# Patient Record
Sex: Female | Born: 1945 | Race: White | Hispanic: No | Marital: Single | Smoking: Current some day smoker
Health system: Southern US, Community
[De-identification: ages and names within clinical notes are randomized; demographics above are authoritative.]

## PROBLEM LIST (undated history)

## (undated) DIAGNOSIS — I2510 Atherosclerotic heart disease of native coronary artery without angina pectoris: Secondary | ICD-10-CM

## (undated) DIAGNOSIS — E119 Type 2 diabetes mellitus without complications: Secondary | ICD-10-CM

## (undated) DIAGNOSIS — I5032 Chronic diastolic (congestive) heart failure: Secondary | ICD-10-CM

## (undated) DIAGNOSIS — I4891 Unspecified atrial fibrillation: Secondary | ICD-10-CM

## (undated) DIAGNOSIS — N183 Chronic kidney disease, stage 3 unspecified: Secondary | ICD-10-CM

## (undated) DIAGNOSIS — F329 Major depressive disorder, single episode, unspecified: Secondary | ICD-10-CM

## (undated) DIAGNOSIS — C50919 Malignant neoplasm of unspecified site of unspecified female breast: Secondary | ICD-10-CM

## (undated) DIAGNOSIS — J449 Chronic obstructive pulmonary disease, unspecified: Secondary | ICD-10-CM

## (undated) DIAGNOSIS — I10 Essential (primary) hypertension: Secondary | ICD-10-CM

## (undated) DIAGNOSIS — T7840XA Allergy, unspecified, initial encounter: Secondary | ICD-10-CM

## (undated) DIAGNOSIS — I272 Pulmonary hypertension, unspecified: Secondary | ICD-10-CM

## (undated) DIAGNOSIS — F32A Depression, unspecified: Secondary | ICD-10-CM

## (undated) HISTORY — PX: APPENDECTOMY: SHX54

## (undated) HISTORY — PX: CORONARY ARTERY BYPASS GRAFT: SHX141

## (undated) HISTORY — PX: PARATHYROIDECTOMY: SHX19

## (undated) HISTORY — PX: MASTECTOMY: SHX3

## (undated) HISTORY — PX: BREAST LUMPECTOMY WITH AXILLARY LYMPH NODE DISSECTION: SHX5756

---

## 2010-06-24 ENCOUNTER — Inpatient Hospital Stay: Payer: Self-pay | Admitting: Surgery

## 2010-07-03 ENCOUNTER — Ambulatory Visit: Payer: Self-pay | Admitting: Surgery

## 2011-04-14 ENCOUNTER — Emergency Department: Payer: Self-pay | Admitting: Emergency Medicine

## 2011-04-15 ENCOUNTER — Inpatient Hospital Stay (HOSPITAL_COMMUNITY)
Admission: EM | Admit: 2011-04-15 | Discharge: 2011-04-21 | DRG: 286 | Disposition: A | Discharge status: Home / Self Care | Payer: Medicare Other | Source: Other Acute Inpatient Hospital | Attending: Internal Medicine | Admitting: Internal Medicine

## 2011-04-15 DIAGNOSIS — I509 Heart failure, unspecified: Secondary | ICD-10-CM | POA: Diagnosis present

## 2011-04-15 DIAGNOSIS — D509 Iron deficiency anemia, unspecified: Secondary | ICD-10-CM | POA: Diagnosis present

## 2011-04-15 DIAGNOSIS — Z7982 Long term (current) use of aspirin: Secondary | ICD-10-CM

## 2011-04-15 DIAGNOSIS — I2789 Other specified pulmonary heart diseases: Secondary | ICD-10-CM | POA: Diagnosis present

## 2011-04-15 DIAGNOSIS — I059 Rheumatic mitral valve disease, unspecified: Secondary | ICD-10-CM

## 2011-04-15 DIAGNOSIS — E119 Type 2 diabetes mellitus without complications: Secondary | ICD-10-CM | POA: Diagnosis present

## 2011-04-15 DIAGNOSIS — E876 Hypokalemia: Secondary | ICD-10-CM | POA: Diagnosis present

## 2011-04-15 DIAGNOSIS — K2971 Gastritis, unspecified, with bleeding: Secondary | ICD-10-CM | POA: Diagnosis present

## 2011-04-15 DIAGNOSIS — E873 Alkalosis: Secondary | ICD-10-CM | POA: Diagnosis not present

## 2011-04-15 DIAGNOSIS — R188 Other ascites: Secondary | ICD-10-CM | POA: Diagnosis present

## 2011-04-15 DIAGNOSIS — R680 Hypothermia, not associated with low environmental temperature: Secondary | ICD-10-CM | POA: Diagnosis present

## 2011-04-15 DIAGNOSIS — N182 Chronic kidney disease, stage 2 (mild): Secondary | ICD-10-CM | POA: Diagnosis present

## 2011-04-15 DIAGNOSIS — J449 Chronic obstructive pulmonary disease, unspecified: Secondary | ICD-10-CM | POA: Diagnosis present

## 2011-04-15 DIAGNOSIS — N179 Acute kidney failure, unspecified: Secondary | ICD-10-CM | POA: Diagnosis present

## 2011-04-15 DIAGNOSIS — Z951 Presence of aortocoronary bypass graft: Secondary | ICD-10-CM

## 2011-04-15 DIAGNOSIS — I5043 Acute on chronic combined systolic (congestive) and diastolic (congestive) heart failure: Secondary | ICD-10-CM | POA: Diagnosis present

## 2011-04-15 DIAGNOSIS — I498 Other specified cardiac arrhythmias: Principal | ICD-10-CM | POA: Diagnosis present

## 2011-04-15 DIAGNOSIS — E875 Hyperkalemia: Secondary | ICD-10-CM | POA: Diagnosis present

## 2011-04-15 DIAGNOSIS — J4489 Other specified chronic obstructive pulmonary disease: Secondary | ICD-10-CM | POA: Diagnosis present

## 2011-04-15 DIAGNOSIS — I2589 Other forms of chronic ischemic heart disease: Secondary | ICD-10-CM | POA: Diagnosis present

## 2011-04-15 DIAGNOSIS — M810 Age-related osteoporosis without current pathological fracture: Secondary | ICD-10-CM | POA: Diagnosis present

## 2011-04-15 DIAGNOSIS — Z853 Personal history of malignant neoplasm of breast: Secondary | ICD-10-CM

## 2011-04-15 DIAGNOSIS — I4891 Unspecified atrial fibrillation: Secondary | ICD-10-CM | POA: Diagnosis present

## 2011-04-15 DIAGNOSIS — I2510 Atherosclerotic heart disease of native coronary artery without angina pectoris: Secondary | ICD-10-CM | POA: Diagnosis present

## 2011-04-15 DIAGNOSIS — I129 Hypertensive chronic kidney disease with stage 1 through stage 4 chronic kidney disease, or unspecified chronic kidney disease: Secondary | ICD-10-CM | POA: Diagnosis present

## 2011-04-15 DIAGNOSIS — F17200 Nicotine dependence, unspecified, uncomplicated: Secondary | ICD-10-CM | POA: Diagnosis present

## 2011-04-15 LAB — BASIC METABOLIC PANEL
BUN: 50 mg/dL — ABNORMAL HIGH (ref 6–23)
CO2: 15 mEq/L — ABNORMAL LOW (ref 19–32)
Calcium: 9.3 mg/dL (ref 8.4–10.5)
Calcium: 8.9 mg/dL (ref 8.4–10.5)
Creatinine, Ser: 2.06 mg/dL — ABNORMAL HIGH (ref 0.50–1.10)
GFR calc Af Amer: 33 mL/min — ABNORMAL LOW (ref >60)
GFR calc non Af Amer: 27 mL/min — ABNORMAL LOW (ref >60)
GFR calc non Af Amer: 24 mL/min — ABNORMAL LOW (ref >60)
Glucose, Bld: 143 mg/dL — ABNORMAL HIGH (ref 70–99)
Glucose, Bld: 140 mg/dL — ABNORMAL HIGH (ref 70–99)
Potassium: 6.2 mEq/L — ABNORMAL HIGH (ref 3.5–5.1)
Sodium: 133 mEq/L — ABNORMAL LOW (ref 135–145)
Sodium: 136 mEq/L (ref 135–145)

## 2011-04-15 LAB — CARDIAC PANEL(CRET KIN+CKTOT+MB+TROPI)
CK, MB: 1.9 ng/mL (ref 0.3–4.0)
CK, MB: 2 ng/mL (ref 0.3–4.0)
CK, MB: 2.9 ng/mL (ref 0.3–4.0)
Relative Index: INVALID (ref 0.0–2.5)
Relative Index: INVALID (ref 0.0–2.5)
Relative Index: INVALID (ref 0.0–2.5)
Total CK: 25 U/L (ref 7–177)
Troponin I: <0.3 ng/mL (ref <0.30)
Troponin I: <0.3 ng/mL (ref <0.30)

## 2011-04-15 LAB — GLUCOSE, CAPILLARY
Glucose-Capillary: 161 mg/dL — ABNORMAL HIGH (ref 70–99)
Glucose-Capillary: 109 mg/dL — ABNORMAL HIGH (ref 70–99)
Glucose-Capillary: 74 mg/dL (ref 70–99)

## 2011-04-15 LAB — CBC
HCT: 24.5 % — ABNORMAL LOW (ref 36.0–46.0)
Hemoglobin: 7.3 g/dL — ABNORMAL LOW (ref 12.0–15.0)
MCH: 24.8 pg — ABNORMAL LOW (ref 26.0–34.0)
MCHC: 29.8 g/dL — ABNORMAL LOW (ref 30.0–36.0)
RBC: 2.94 MIL/uL — ABNORMAL LOW (ref 3.87–5.11)

## 2011-04-15 LAB — MRSA PCR SCREENING: MRSA by PCR: NEGATIVE

## 2011-04-15 LAB — OCCULT BLOOD X 1 CARD TO LAB, STOOL: Fecal Occult Bld: POSITIVE

## 2011-04-15 LAB — POTASSIUM: Potassium: 5.8 mEq/L — ABNORMAL HIGH (ref 3.5–5.1)

## 2011-04-15 LAB — HEMOGLOBIN A1C
Hgb A1c MFr Bld: 6.9 % — ABNORMAL HIGH (ref <5.7)
Mean Plasma Glucose: 151 mg/dL — ABNORMAL HIGH (ref <117)

## 2011-04-16 ENCOUNTER — Encounter: Payer: Self-pay | Admitting: Internal Medicine

## 2011-04-16 ENCOUNTER — Inpatient Hospital Stay (HOSPITAL_COMMUNITY): Payer: Medicare Other

## 2011-04-16 DIAGNOSIS — I498 Other specified cardiac arrhythmias: Secondary | ICD-10-CM

## 2011-04-16 DIAGNOSIS — I5022 Chronic systolic (congestive) heart failure: Secondary | ICD-10-CM

## 2011-04-16 LAB — BASIC METABOLIC PANEL
CO2: 18 mEq/L — ABNORMAL LOW (ref 19–32)
Calcium: 9 mg/dL (ref 8.4–10.5)
GFR calc non Af Amer: 26 mL/min — ABNORMAL LOW (ref >60)
Potassium: 4.9 mEq/L (ref 3.5–5.1)
Sodium: 141 mEq/L (ref 135–145)

## 2011-04-16 LAB — GLUCOSE, CAPILLARY
Glucose-Capillary: 122 mg/dL — ABNORMAL HIGH (ref 70–99)
Glucose-Capillary: 234 mg/dL — ABNORMAL HIGH (ref 70–99)
Glucose-Capillary: 241 mg/dL — ABNORMAL HIGH (ref 70–99)

## 2011-04-16 LAB — VITAMIN B12: Vitamin B-12: 229 pg/mL (ref 211–911)

## 2011-04-16 LAB — CBC
MCH: 24.7 pg — ABNORMAL LOW (ref 26.0–34.0)
Platelets: 242 10*3/uL (ref 150–400)
RBC: 3.12 MIL/uL — ABNORMAL LOW (ref 3.87–5.11)

## 2011-04-16 LAB — PROTIME-INR: INR: 1.05 (ref 0.00–1.49)

## 2011-04-16 LAB — IRON AND TIBC
Iron: 15 ug/dL — ABNORMAL LOW (ref 42–135)
TIBC: 405 ug/dL (ref 250–470)

## 2011-04-16 NOTE — H&P (Signed)
Hospital Consult Note  Date: 04/16/2011  Patient name:  Anita Wilson  Medical record number:  [id number] Date of birth:  06-25-1946  Age: 65 y.o. Gender:  female PCP:    No primary provider on file.  Medical Service:   Internal Medicine Teaching Service   Attending physician:  Dr. Ulyess Mort First Contact:   Dr. Candy Sledge  Pager: [phone] Second Contact:   Dr. Gilford Rile  Pager: [phone] After Hours:    First Contact   Pager: [phone]      Second Contact  Pager: [phone]   Chief Complaint: Bradycardia and GI bleeding  History of Present Illness: Patient is a 65 y.o. female with a PMHx most significant for anemia (hemoglobin of 8), chronic kidney disease(1.8), diabetes (Hba1c 6.9), diastolic congestive heart failure, coronary artery disease s/p CABG, atrial fibrillation on coumadin and history of GI bleed was transferred from [facility] for bradycardia. Patient presented to [facility] 4 days prior to admission with chief complaint of loose stools and associated blood. Coumadin was stopped and patient does not report loose stools or frank blood at this time. Patient had a GI workup at [facility] with upper endoscopy was consistent with nonbleeding erosive gastropathy and colonoscopy consistent with polyp. Patient was diagnosed with iron deficiency anemia and was started on oral iron therapy along with 2 units of transfusion during hospital course of stay. Patient denies any abdominal pain, change in her diet, change in her appetite, change in medications recently. Patient denies any recent weight loss. No change in the color of urine noted. Patient also complains off progressive swelling in her extremities and her belly which started earlier this year. She had an extensive workup at [facility] which was consistent with hepatosplenomegaly and diastolic congestive heart failure. Patient also had moderate proteinuria on urine collection studies which were thought to  be secondary to her diabetes. Patient's albumin (>4) was not low enough to cause anasarca. Patient was started on diuretics  which relieved edema and has been on and off diuretics due to chronic kidney disease. Hepatic serologies and RPR were negative. Synthetic function was normal. Patient has also been struggling with hyperkalemic episodes since last 6 month. Potassium has been anywhere from 6-7.7 during previous admissions in the last 6 months. The hypokalemia has been managed with Kayexalate on an intermittent an as-needed basis. The bradycardia episode was asymptomatic and patient had been ruled out for acute ischemia. Patient hasn't had any episodes of bradycardia during the hospital course of stay.   Current Outpatient Medications: Amiodarone 200 mg daily Aspirin 81 mg daily Atenolol 25 mg daily Bumex 1 mg half tablet twice a day Cardizem extended 240 mg daily Fosamax 70 mg every week  glipizide 10 mg twice daily Glucophage 500 mg 4 times daily Iron one tablet daily Isosorbide mononitrate 30 mg daily Lantus 10 units every night Lasix 20 mg daily  Prilosec 40 Spiriva 18 mcg once daily Vitamin E 1 tablet daily  Coumadin 5 mg as directed   Allergies: Morphine and sulfa   Past Medical History: COPD, pulmonary hypertension, diabetes, osteoporosis, history of GI bleed, atrial fibrillation on Coumadin and amiodarone and diltiazem, breast cancer history of mastectomy in 1984, coronary artery disease CABG in 2001 recent catheter in 2009 suggestive of patent grafts, anemia iron deficiency baseline 8, chronic kidney disease baseline creatinine 1.8- moderate proteinuria of about 500 mg a day noted, hyperkalemia, diastolic congestive heart failure proBNP 17,000 in April 2012, hepatosplenomegaly associated with ascites.  Past Surgical History: Parathyroidectomy in 2009 CABG in 2001 Mastectomy in 1984 Appendectomy     Family History: Coronary artery disease with MI at age 81 in her  father   Social History: Patient lives alone, she smoked one pack per day for about 50 years recently cut down to 3 cigarettes a day   Review of Systems: Pertinent items are noted in HPI.  Vital Signs: T:  98.3  P:  64 BP:  113/42  RR:  16 O2 sat:  97% with room    Physical Exam: General: Vital signs reviewed and noted. Well-developed, well-nourished, in no acute distress; alert, appropriate and cooperative throughout examination.  Head: Normocephalic, atraumatic.  Eyes: PERRL, EOMI, No signs of anemia or jaundince.  Ears: TM nonerythematous, not bulging, good light reflex bilaterally.  Nose: Mucous membranes moist, not inflammed, nonerythematous.  Throat: Oropharynx nonerythematous, no exudate appreciated.   Neck: No deformities, masses, or tenderness noted.Supple, No carotid Bruits, JVD 14 cm with positive kussmaul's  Lungs:  Normal respiratory effort. Clear to auscultation BL without crackles or wheezes.  Heart: RRR. S1 and S2 normal without gallop, or rubs. systolic murmur best heard at left sternal border   Abdomen:  BS normoactive. Soft, distended, non-tender.  Hepatomegaly nontender liver edge palpated about 3 finger breadths below right costal margin , spleen palpated about 4-5 cm below left costal margin, shifting dullness present   Extremities:  2+ pitting edema   Neurologic: A&O X3, CN II - XII are grossly intact. Motor strength is 5/5 in the all 4 extremities, Sensations intact to light touch, Cerebellar signs negative.  Skin: No visible rashes, scars.   Lab results: CBC:    Component Value Date/Time   WBC 7.4 04/16/2011 0336   HGB 7.7* 04/16/2011 0336   HCT 26.0* 04/16/2011 0336   PLT 242 04/16/2011 0336   MCV 83.3 04/16/2011 0336      Comprehensive Metabolic Panel:    Component Value Date/Time   NA 141 04/16/2011 0336   K 4.9 04/16/2011 0336   CL 113* 04/16/2011 0336   CO2 18* 04/16/2011 0336   BUN 48* 04/16/2011 0336   CREATININE 1.94* 04/16/2011 0336   GLUCOSE  124* 04/16/2011 0336   CALCIUM 9.0 04/16/2011 0336     Lab Results  Component Value Date   CKTOTAL 27 04/15/2011   CKMB 2.9 04/15/2011   TROPONINI <0.30 04/15/2011   Occult Blood, Fecal                      POSITIVE   T3-Triiodothyronine                      49.8       l      80-204           Ng/dl  Thyroxine (T4)                           5.5               5.0-12.5         Ug/dL   Hemoglobin Z6X                           6.9        h      4.6-6.1          %   Imaging results:  2D echo Study Conclusions   - Left ventricle: The cavity size was moderately dilated. Systolic     function was moderately to severely reduced. The estimated     ejection fraction was in the range of 30% to 35%. Diffuse     hypokinesis.   - Mitral valve: Calcified annulus. Moderate regurgitation.   - Left atrium: The atrium was mildly dilated.   - Right ventricle: The cavity size was moderately dilated.   - Right atrium: The atrium was mildly dilated.   - Tricuspid valve: Moderate regurgitation.   - Pulmonary arteries: PA peak pressure: 38mm Hg (S).   - Impressions: Significant biventricular failure   Impressions:   - Significant biventricular failure   Assessment & Plan:   Problem #1 Biventricular heart failure: The main differentials for biventricular heart failure with the restrictive cardiomyopathy versus constrictive pericarditis. Restrictive myopathy would be further differentiated into sarcoidosis, amyloidosis, hemachromatosis. I will check a two-view chest x-ray, plasma ACE levels, anemia panel and SPEP/UPEP with immunofixation to rule out above. Constrictive pericarditis is less likely given that patient does not have history of radiation therapy or TB. A cardiac MRI versus right heart catheterization is a reasonable approach for initial evaluation. Cardiology team is primary on this patient and following her. High doses of diuretics to decongest the heart given the patient's JVD is markedly  elevated should be reasonable at this time. We would increase the Lasix to 40 mg twice a day and hold for systolic blood pressure less than 90.  Problem #2 Anasarca: This is most likely secondary to right heart failure as JVD is elevated. We may consider a diagnostic plus therapeutic paracentesis after discussion with the attending. The patient's total protein is normal and also her kidney function suggests chronic kidney disease without any acute worsening.  Problem #3 Anemia: The patient's hemoglobin was 8.0 during one of the admissions in February at [facility]. Patient currently does not seem symptomatic from anemia. We'll order an anemia panel and continue patient on oral iron therapy and transfuse if patient's hemoglobin drops below 7. Patient does have a FOBT positive stool but given that Hbg is stable, I think GI consult can wait. I will start her on protonix BID given h/o grastric erosions. Hold Coumadin at this time.  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 We may consider starting the patient on low dose mineralocorticoid as outpatient to avoid future episodes. The physical hyperkalemia hopefully if the benefit of ACE or ARB's in this patient and it should be avoided.  Problem #5 Diabetes: Well controlled at this time with hba1c of 6.9. Continue to monitor on sliding scale insulin.  Problem #5 Bradycardia: Resolved at this time. Cardiology following. Deposition disorders may likely be the cause for bradycardia and we are checking for them as above. Continue to monitor.  Problem #6 COPD and pulmonary hypertension: Continue home Spiriva and breathing treatments as necessary.   Problem #7 atrial fibrillation: Hold Cardizem and beta blocker at this time in the setting of  bradycardia.  Problem #8 Carotid artery disease: Continue aspirin. Patient has new onset decrease in ejection fraction as compared to the prior echo done in [place]. Cardiology to decide about further plan. Cardiac enzymes negative x3 so far.         DVT PPX:  Heparin 500 TID     (PGY1):  ____________________________________    Date/ Time:      ____________________________________     Lars Mage, M.D. (Senior resident):    ____________________________________    Date/ Time:      ____________________________________     I have seen and examined the patient. I reviewed the resident/fellow note and agree with the findings and plan of care as documented. My additions and revisions are included.   Signature:  ____________________________________________     Internal Medicine Teaching Service Attending    Date:    ____________________________________________

## 2011-04-17 DIAGNOSIS — R195 Other fecal abnormalities: Secondary | ICD-10-CM

## 2011-04-17 DIAGNOSIS — D509 Iron deficiency anemia, unspecified: Secondary | ICD-10-CM

## 2011-04-17 DIAGNOSIS — I459 Conduction disorder, unspecified: Secondary | ICD-10-CM

## 2011-04-17 LAB — BASIC METABOLIC PANEL
CO2: 21 mEq/L (ref 19–32)
Chloride: 110 mEq/L (ref 96–112)
Creatinine, Ser: 1.55 mg/dL — ABNORMAL HIGH (ref 0.50–1.10)
GFR calc Af Amer: 41 mL/min — ABNORMAL LOW (ref >60)
Potassium: 4.4 mEq/L (ref 3.5–5.1)
Sodium: 138 mEq/L (ref 135–145)

## 2011-04-17 LAB — GLUCOSE, CAPILLARY
Glucose-Capillary: 209 mg/dL — ABNORMAL HIGH (ref 70–99)
Glucose-Capillary: 213 mg/dL — ABNORMAL HIGH (ref 70–99)
Glucose-Capillary: 138 mg/dL — ABNORMAL HIGH (ref 70–99)
Glucose-Capillary: 214 mg/dL — ABNORMAL HIGH (ref 70–99)

## 2011-04-17 LAB — CBC
MCV: 82.8 fL (ref 78.0–100.0)
Platelets: 173 10*3/uL (ref 150–400)
RBC: 2.68 MIL/uL — ABNORMAL LOW (ref 3.87–5.11)
RDW: 17.1 % — ABNORMAL HIGH (ref 11.5–15.5)
WBC: 5.1 10*3/uL (ref 4.0–10.5)

## 2011-04-18 DIAGNOSIS — K2900 Acute gastritis without bleeding: Secondary | ICD-10-CM

## 2011-04-18 DIAGNOSIS — I2510 Atherosclerotic heart disease of native coronary artery without angina pectoris: Secondary | ICD-10-CM

## 2011-04-18 LAB — CBC
HCT: 24.3 % — ABNORMAL LOW (ref 36.0–46.0)
MCH: 25.2 pg — ABNORMAL LOW (ref 26.0–34.0)
MCV: 80.7 fL (ref 78.0–100.0)
Platelets: 188 10*3/uL (ref 150–400)
RDW: 16.7 % — ABNORMAL HIGH (ref 11.5–15.5)
WBC: 5.7 10*3/uL (ref 4.0–10.5)

## 2011-04-18 LAB — COMPREHENSIVE METABOLIC PANEL
BUN: 35 mg/dL — ABNORMAL HIGH (ref 6–23)
CO2: 24 mEq/L (ref 19–32)
Calcium: 8.6 mg/dL (ref 8.4–10.5)
Chloride: 110 mEq/L (ref 96–112)
Creatinine, Ser: 1.27 mg/dL — ABNORMAL HIGH (ref 0.50–1.10)
GFR calc non Af Amer: 42 mL/min — ABNORMAL LOW (ref >60)
Total Bilirubin: 0.2 mg/dL — ABNORMAL LOW (ref 0.3–1.2)

## 2011-04-18 LAB — GLUCOSE, CAPILLARY
Glucose-Capillary: 66 mg/dL — ABNORMAL LOW (ref 70–99)
Glucose-Capillary: 114 mg/dL — ABNORMAL HIGH (ref 70–99)

## 2011-04-19 DIAGNOSIS — I5023 Acute on chronic systolic (congestive) heart failure: Secondary | ICD-10-CM

## 2011-04-19 LAB — CBC
HCT: 24.1 % — ABNORMAL LOW (ref 36.0–46.0)
Hemoglobin: 7.6 g/dL — ABNORMAL LOW (ref 12.0–15.0)
MCH: 25.4 pg — ABNORMAL LOW (ref 26.0–34.0)
MCV: 80.6 fL (ref 78.0–100.0)
Platelets: 182 10*3/uL (ref 150–400)
RBC: 2.99 MIL/uL — ABNORMAL LOW (ref 3.87–5.11)
RDW: 17 % — ABNORMAL HIGH (ref 11.5–15.5)
WBC: 4 10*3/uL (ref 4.0–10.5)

## 2011-04-19 LAB — BASIC METABOLIC PANEL
BUN: 29 mg/dL — ABNORMAL HIGH (ref 6–23)
CO2: 26 mEq/L (ref 19–32)
Chloride: 105 mEq/L (ref 96–112)
Creatinine, Ser: 1.12 mg/dL — ABNORMAL HIGH (ref 0.50–1.10)

## 2011-04-19 LAB — GLUCOSE, CAPILLARY
Glucose-Capillary: 127 mg/dL — ABNORMAL HIGH (ref 70–99)
Glucose-Capillary: 300 mg/dL — ABNORMAL HIGH (ref 70–99)
Glucose-Capillary: 225 mg/dL — ABNORMAL HIGH (ref 70–99)

## 2011-04-19 LAB — CLOTEST (H. PYLORI), BIOPSY: Helicobacter screen: NEGATIVE

## 2011-04-20 DIAGNOSIS — I459 Conduction disorder, unspecified: Secondary | ICD-10-CM

## 2011-04-20 LAB — BASIC METABOLIC PANEL
BUN: 23 mg/dL (ref 6–23)
BUN: 19 mg/dL (ref 6–23)
Calcium: 8.4 mg/dL (ref 8.4–10.5)
Chloride: 101 mEq/L (ref 96–112)
GFR calc Af Amer: >60 mL/min (ref >60)
GFR calc non Af Amer: 60 mL/min — ABNORMAL LOW (ref >60)
Glucose, Bld: 235 mg/dL — ABNORMAL HIGH (ref 70–99)
Potassium: 2.6 mEq/L — CL (ref 3.5–5.1)
Potassium: 4.3 mEq/L (ref 3.5–5.1)
Sodium: 141 mEq/L (ref 135–145)

## 2011-04-20 LAB — POCT I-STAT 3, VENOUS BLOOD GAS (G3P V)
Acid-Base Excess: 7 mmol/L — ABNORMAL HIGH (ref 0.0–2.0)
Bicarbonate: 32.2 mEq/L — ABNORMAL HIGH (ref 20.0–24.0)
Bicarbonate: 32.4 mEq/L — ABNORMAL HIGH (ref 20.0–24.0)
O2 Saturation: 57 %
O2 Saturation: 67 %
TCO2: 34 mmol/L (ref 0–100)
pCO2, Ven: 48.4 mmHg (ref 45.0–50.0)
pH, Ven: 7.431 — ABNORMAL HIGH (ref 7.250–7.300)
pO2, Ven: 30 mmHg (ref 30.0–45.0)

## 2011-04-20 LAB — PROTEIN ELECTROPH W RFLX QUANT IMMUNOGLOBULINS
Beta 2: 6.1 % (ref 3.2–6.5)
Beta Globulin: 7.4 % — ABNORMAL HIGH (ref 4.7–7.2)
Gamma Globulin: 15.2 % (ref 11.1–18.8)
M-Spike, %: NOT DETECTED g/dL
Total Protein ELP: 6.6 g/dL (ref 6.0–8.3)

## 2011-04-20 LAB — POCT I-STAT 3, ART BLOOD GAS (G3+)
O2 Saturation: 98 %
TCO2: 33 mmol/L (ref 0–100)
pCO2 arterial: 42.5 mmHg (ref 35.0–45.0)
pH, Arterial: 7.478 — ABNORMAL HIGH (ref 7.350–7.400)
pO2, Arterial: 99 mmHg (ref 80.0–100.0)

## 2011-04-20 LAB — CBC
HCT: 26.2 % — ABNORMAL LOW (ref 36.0–46.0)
MCH: 25.3 pg — ABNORMAL LOW (ref 26.0–34.0)
MCHC: 31.3 g/dL (ref 30.0–36.0)
RDW: 17.2 % — ABNORMAL HIGH (ref 11.5–15.5)

## 2011-04-20 LAB — IMMUNOFIXATION ELECTROPHORESIS
IgA: 343 mg/dL (ref 68–380)
IgG (Immunoglobin G), Serum: 945 mg/dL (ref 690–1700)
Total Protein ELP: 6.6 g/dL (ref 6.0–8.3)

## 2011-04-20 LAB — GLUCOSE, CAPILLARY: Glucose-Capillary: 239 mg/dL — ABNORMAL HIGH (ref 70–99)

## 2011-04-21 DIAGNOSIS — I5043 Acute on chronic combined systolic (congestive) and diastolic (congestive) heart failure: Secondary | ICD-10-CM

## 2011-04-21 LAB — CROSSMATCH
ABO/RH(D): A POS
Antibody Screen: NEGATIVE
Unit division: 0

## 2011-04-21 LAB — CBC
HCT: 27 % — ABNORMAL LOW (ref 36.0–46.0)
Hemoglobin: 8.5 g/dL — ABNORMAL LOW (ref 12.0–15.0)
MCH: 26.1 pg (ref 26.0–34.0)
MCHC: 31.5 g/dL (ref 30.0–36.0)
RDW: 17.8 % — ABNORMAL HIGH (ref 11.5–15.5)

## 2011-04-21 LAB — UIFE/LIGHT CHAINS/TP QN, 24-HR UR
Albumin, U: DETECTED
Alpha 1, Urine: DETECTED — AB
Alpha 2, Urine: DETECTED — AB
Beta, Urine: DETECTED — AB
Free Kappa Lt Chains,Ur: 18.6 mg/dL — ABNORMAL HIGH (ref 0.14–2.42)
Gamma Globulin, Urine: DETECTED — AB
Volume, Urine: 2400 mL

## 2011-04-21 LAB — BASIC METABOLIC PANEL
BUN: 20 mg/dL (ref 6–23)
Calcium: 8.8 mg/dL (ref 8.4–10.5)
Creatinine, Ser: 0.95 mg/dL (ref 0.50–1.10)
GFR calc Af Amer: >60 mL/min (ref >60)
GFR calc non Af Amer: 59 mL/min — ABNORMAL LOW (ref >60)
Glucose, Bld: 155 mg/dL — ABNORMAL HIGH (ref 70–99)

## 2011-04-29 NOTE — Cardiovascular Report (Signed)
Anita Wilson, Anita Wilson           ACCOUNT NO.:  [id number]  MEDICAL RECORD NO.:  [id number]  LOCATION:  3711                         FACILITY:  [facility]  PHYSICIAN:  Veverly Fells. Excell Seltzer, MD  DATE OF BIRTH:  12/07/1945  DATE OF PROCEDURE:  04/20/2011 DATE OF DISCHARGE:                           CARDIAC CATHETERIZATION   PROCEDURES: 1. Right heart catheterization. 2. Left heart catheterization. 3. Selective coronary angiography. 4. Saphenous vein graft angiography. 5. Left internal mammary artery angiography. 6. Aortic root angiography.  SURGEON:  Veverly Fells. Excell Seltzer, MD  Hilarie Fredrickson INDICATIONS:  [name] is a 65 year old woman with a complex medical history.  She has coronary disease and has undergone coronary bypass surgery in 2001.  She has anemia, chronic kidney disease, newly diagnosed LV dysfunction with previous left ventricular ejection fraction normal and at the time of this admission her EF was 30- 35%.  The patient has anasarca and was referred for right and left heart catheterization.  DESCRIPTION OF PROCEDURE:  Risks and indications of the procedure were reviewed with the patient and informed consent was obtained.  The right groin was prepped, draped, and anesthetized with 1% lidocaine using the modified Seldinger technique.  A 5-French sheath was placed in the right femoral artery and a 7-French sheath was placed in the right femoral vein.  A Swan-Ganz catheter was used for the right heart catheterization.  A pigtail catheter was advanced into the left ventricle where pressures were recorded.  Simultaneous LV and pulmonary capillary wedge pressures were recorded.  Oxygen saturations were drawn from the superior vena cava, pulmonary artery, and central aorta.  Fick cardiac output was calculated.  Standard Judkins catheters were then used for coronary angiography, saphenous vein graft angiography, and LIMA angiography.  There were 3 bypass grafts visualized and by  history the patient had 4 bypasses.  Even though all coronary territories seemed to be accounted for, I elected to perform an aortic root angiogram to make sure that I was not missing a saphenous vein graft conduit.  The patient tolerated the procedure well.  All catheter exchanges were performed over a guidewire.  There were no immediate complications.  PROCEDURAL FINDINGS:  Right atrial pressure:  A-wave 17, V-wave 17, mean of 15.  Right ventricular pressure 63/20.  Pulmonary artery pressure 62/33 with a mean of 49.  Pulmonary capillary wedge pressure:  A-wave 31, V-wave 40, mean of 31.  Left ventricular pressure is 181/31.  Aortic pressure is 181/86 with a mean of 122.  Oxygen saturations:  Aorta 98%, pulmonary artery 67%, superior vena cava 57%.  Fick cardiac output is 4.6 L/min.  Cardiac index is 2.9 L/min/m2.  CORONARY ANGIOGRAPHY:  The entire coronary tree is heavily calcified. The left main coronary artery is patent.  There is 50% ostial stenosis and 50% mid left main stenosis.  Left main is heavily calcified throughout its course before it divides into the LAD and left circumflex.  LAD:  The LAD is totally occluded at the first perforator.  Left circumflex:  The left circumflex is totally occluded in the proximal portion of the vessel.  Right coronary artery:  The RCA is severely calcified.  There is 80-90% ostial stenosis.  There is 80% distal  stenosis.  There is diffuse disease throughout.  The distal branch vessels of the RCA fill competitively from the vein graft to the PDA.  BYPASS GRAFT ANGIOGRAPHY:  The LIMA to LAD is patent.  There is TIMI 3 flow with no significant stenosis throughout the mammary or the mid and distal LAD beyond the touchdown site of the mammary graft.  The first diagonal back fills from the mammary graft.  Saphenous vein graft to OM is patent:  There is no significant disease of the vein graft, but there are diffuse irregularities  noted.  Saphenous vein graft to PDA:  This vessel is patent.  There is mild proximal stenosis.  The mid and distal portions of the graft are widely patent.  The PDA is widely patent beyond the anastomosis.  The posterolateral branch fills retrograde from the PDA graft and it is also widely patent with no obstructive disease.  The RCA proper retrograde fills and diffuse disease is noted.  AORTIC ROOT ANGIOGRAM:  The aortic root angiogram shows bypasses to the right coronary and OM territory.  There are no additional bypass conduits seen.  FINAL ASSESSMENT: 1. Severe native three-vessel coronary artery disease with total     occlusion of the left anterior descending, left circumflex, and     severe stenosis of the right coronary artery. 2. Patent coronary bypasses with left internal mammary artery to left     anterior descending, saphenous vein graft to obtuse marginal, and     saphenous vein graft to posterior descending artery. 3. Severely elevated left heart filling pressures. 4. Pulmonary hypertension (out of proportion to left heart disease).     The patient's  pulmonary vascular resistance is approximately 5     [place] units.RECOMMENDATIONS:  The patient will continue with medical therapy for her obstructive coronary disease.  We will try to treat her as aggressively as possible for her residual cardiac disease and heart failure.  We will have to continue with cautious use of diuretics as she has a propensity for hypokalemia.  Her hypertension will be treated aggressively.  I do not see any hemodynamic evidence of constrictive pericarditis and review of her waveforms shows no clear evidence of restrictive cardiomyopathy.     Veverly Fells. Excell Seltzer, MD     MDC/MEDQ  D:  04/20/2011  T:  04/20/2011  Job:  [id number]  cc:   Yisroel Ramming, M.D. Dione Housekeeper, M.D.  Electronically Signed by Tonny Bollman MD on 04/29/2011 11:36:45 PM

## 2011-06-04 NOTE — H&P (Signed)
Anita Wilson, Anita Wilson NO.:  [id number]  MEDICAL RECORD NO.:  [id number]  LOCATION:  2909                         FACILITY:  [facility]  PHYSICIAN:  Rollene Rotunda, MD, FACCDATE OF BIRTH:  [birth year]  DATE OF ADMISSION:  04/15/2011 DATE OF DISCHARGE:                             HISTORY & PHYSICAL   CARDIOLOGIST:  Dr. Christella Noa.  REASON FOR PRESENTATION:  Bradycardia.  HISTORY OF PRESENT ILLNESS:  The patient is a 65 year old white female who has had all of her care at [facility] and [facility].  She does have coronary artery disease with a bypass about 10 years ago.  As near as I can tell from talking to the patient, who is quite sedated, and her family, she has had no recent cardiac workup.  She has had lower extremity swelling which has been treated with diuretics.  However, she has apparently had some renal insufficiency as well.  She is being worked up for a "liver problem."  She has had apparent ascites and was to see a "liver doctor."  She does have a history of anemia and GI bleed with chronic diarrhea.  She is apparently had a workup to include upper and lower scopes, but no etiology has clearly been identified.  She presented to [facility] today really with complaints of diarrhea with blood in it.  She also had bilateral shoulder pain.  She was not describing any chest pressure, neck or arm discomfort.  She was not noticing any palpitations, presyncope, or syncope.  She apparently took some Tylenol PM before she got to the hospital.  There is a report of benzodiazepines in the hospital.  She is now quite somnolent.  While at [facility], she was noted to have bradycardia.  There was junctional escape rhythm with the rate in the 30s.  There was probable AV dissociation with sinus bradycardia.  The patient was treated with atropine.  Because of bradycardia, Cardiology was called at [facility] and it was suggested that she be transferred to [facility].   Upon arrival to [facility], she had sinus rhythm, rate in the 50s and 60s.  Her blood pressure was normal.  She was somewhat hypothermic, but warmed quickly with blankets.  PAST MEDICAL HISTORY: 1. Coronary artery disease. 2. Breast cancer. 3. Diabetes mellitus. 4. Atrial fibrillation.  PAST SURGICAL HISTORY:  Mastectomy, appendectomy, CABG x4 at [facility], thyroid surgery.  ALLERGIES:  MORPHINE and SULFA.  MEDICATIONS: 1. Amiodarone 200 mg daily. 2. Aspirin. 3. Atenolol 25 mg daily. 4. Bumetanide. 5. Cardizem CD 240 mg daily. 6. Fosamax. 7. Glipizide 10 mg b.i.d. 8. Glucophage 500 mg 4 times daily. 9. Iron. 10.Isosorbide 30 mg daily. 11.Lantus. 12.Lasix 20 mg daily. 13.Prilosec. 14.Spiriva. 15.Vitamin C. 16.Warfarin.  SOCIAL HISTORY:  The patient lives alone.  Her daughter and sister are with her today.  She has been a heavy smoker for many, many years.  Family history is contributory for father having his first MI at age 67.  REVIEW OF SYSTEMS:  As stated in the HPI and negative for all other systems though it is somewhat compromised by the patient's somnolence.  PHYSICAL EXAMINATION:  GENERAL:  The patient is in no distress. VITAL SIGNS:  Blood pressure 130/70, heart rate 50s and regular, afebrile, respiratory rate 12. HEENT:  Eyes are unremarkable.  Pupils equal, round, and reactive to light.  Fundi not visualized.  Oral mucosa dry with upper dentures. NECK:  She does have external jugular vein distention, carotid upstrokes are brisk and symmetric, no bruits, no thyromegaly.  LYMPHATICS:  No cervical, axillary, or inguinal adenopathy. LUNGS:  Decreased breath sounds, no wheezing or crackles. BACK:  No costovertebral angle tenderness. CHEST:  Unremarkable. HEART:  PMI not displaced or sustained, S1 and S2 within normal limits, no S3, no S4; 2/6 mid left sternal border systolic murmur, no diastolic murmurs. ABDOMEN:  Distended, positive fluid shift.  No rebound,  no guarding, no hepatomegaly, no splenomegaly. SKIN:  No rashes, no nodules. EXTREMITIES:  2+ pulses, moderate-to-severe bilateral lower extremity edema. NEURO:  She is oriented to person, place, and time, though somnolent. Motor is grossly intact.  Cranial nerves grossly intact.  LABORATORY DATA:  WBC 10.4, hemoglobin 8.1, platelets 351, sodium 132, potassium 6.1, BUN 53, creatinine 1.9.  AST and ALT within normal limits, magnesium 1.9, INR 1.1, TSH 7.52, BNP 17,030.  ASSESSMENT/PLAN: 1. Bradycardia.  The initial cardiac issue was bradycardia but this     has resolved.  Of note, she was hyperkalemic slightly.  This was     treated.  At this point, she needs just to be observed on telemetry     for her rhythm while we sort multiple other issues out. 2. Coronary artery disease.  We need to get old records.  I will cycle     cardiac enzymes.  She will have an echocardiogram.  I doubt any of     this is an acute coronary event, however. 3. Elevated TSH.  I will check T3 and T4. 4. Atrial fibrillation.  For now, I am going to hold her amiodarone.     Also with her bradycardia, I am going to hold her beta-blocker and  Cardizem, but these can be restarted.  Until we make sure she is     not going to have an invasive procedure, I will not restart     Coumadin which is subtherapeutic anyway.  She will be on DVT     prophylaxis. 5. Edema.  The patient appears to have congestive heart failure with     her elevated BNP which I suspect his pro-BNP.  She has significant     edema.  We will check the echocardiogram and need to get old     records. 6. Ascites.  The patient was apparently going to see a liver     specialist.  We will try to get records and see if she has had any     recent ultrasounds.  This would be the initial study and GI consult     will likely be necessary. 7. Chronic renal insufficiency.  Apparently, this was noted last year.     I do not know what her baseline creatinine  is.  I am not going to     aggressively diurese her right now.  We will follow her creatinine. 8. Diabetes.  She will continue with the previous medicines, though I     will hold the metformin for now with her elevated creatinine.     Rollene Rotunda, MD, [facility]     JH/MEDQ  D:  04/15/2011  T:  04/15/2011  Job:  [id number]  Electronically Signed by Rollene Rotunda MD [facility] on 06/04/2011 01:32:05 PM

## 2011-06-04 NOTE — Discharge Summary (Signed)
NAMETRYNITI, LAATSCH NO.:  [id number]  MEDICAL RECORD NO.:  [id number]  LOCATION:  3711                         FACILITY:  [facility]  PHYSICIAN:  Rollene Rotunda, MD, FACCDATE OF BIRTH:  04-Apr-1946  DATE OF ADMISSION:  04/15/2011 DATE OF DISCHARGE:  04/21/2011                              DISCHARGE SUMMARY   PRIMARY CARDIOLOGIST:  Dr. Christella Noa at [facility].  PRIMARY CARE PROVIDER:  Dr. Dione Housekeeper at [facility].  PRIMARY NEPHROLOGIST:  Dr. Aram Beecham Denu-Ciocca at [facility].  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  SECONDARY DIAGNOSES: 1. Ischemic cardiomyopathy. 2. Coronary artery disease status post prior coronary bypass grafting     in 2001 with 3/3 patent grafts by catheterization on April 20, 2011. 3. Pulmonary hypertension with pulmonary arterial pressure of 62/33     (49). 4. Right heart catheterization on this admission. 5. Acute on chronic stage II kidney disease with creatinine 0.95 on     discharge today. 6. Symptomatic bradycardia with discontinuation of amiodarone and     diltiazem. 7. Paroxysmal atrial fibrillation with discontinuation of Coumadin     secondary to ongoing GI blood loss. 8. Iron-deficiency anemia status post 1 unit packed red blood cells. 9. History of gastrointestinal bleed with EGD this admission showing     multiple superficial erosions in the antrum.  Negative Helicobacter     screen. 10.Fecal occult blood, positive stool. 11.Hepatomegaly. 12.Splenomegaly. 13.Diabetes mellitus. 14.History of breast cancer status post mastectomy in 1984. 15.Hyperkalemia, requiring correction with Kayexalate. 16.Chronic obstructive pulmonary disease with ongoing tobacco abuse. 17.Osteoporosis. 18.Ascites. 19.Status post parathyroidectomy in 2009. 20.Status post appendectomy.  ALLERGIES: 1. MORPHINE. 2. SULFA.  PROCEDURES: 1. A 2-D  echocardiogram, April 15, 2011, revealing an EF of 30% to     35% with diffuse hypokinesis.  There is moderate mitral     regurgitation and mildly dilated left atrium.  Moderately dilated     right ventricle and mildly dilated right atrium.  Moderate     tricuspid regurgitation with a PASP of 38 mmHg.  Overall impression     of significant biventricular failure. 2. EGD with biopsy performed on April 18, 2011, revealing normal     esophagus and GE junction with multiple superficial erosions found     in the antrum, but otherwise normal stomach.  CLO biopsy was taken,     subsequently found to be negative.  Duodenal bulb was normal in     appearance as was the post bulbar duodenum.  No active bleeding or     blood seen.  Recommendation for continuation of PPI, iron,     transfused to clinically desired hemoglobin, and follow up at [facility] for consideration of her capsule endoscopy. 3. Diagnostic catheterization performed on April 20, 2011, right     heart pressures RA 15, RV 63/20, PA 62/33 (49), and PCWP 31.     Aortic pressure 181/86 (122), aortic saturation 98%, pulmonary     arterial saturation 67%, and SVC saturation 57%.  Fick cardiac     output 4.6 L per  minute.  Cardiac index 2.9 L per minute per m2. 4. Coronary angiography, LAD 100% mid.  Left circumflex 100% mid.     Right coronary artery 80% ostial with heavy calcification and also     80% distal.  LIMA to the LAD is widely patent.  Vein graft to the     obtuse margin was patent.  Vein graft to the PDA is widely patent.     No hemodynamic evidence for constrictive pericarditis or evidence     of restrictive cardiomyopathy.  HISTORY OF PRESENT ILLNESS:  A 65 year old female with the above complex problem list, who has been followed closely at [place] [facility] [place] at [facility], for history of CAD, CHF, anemia with erosive gastropathy, hepato and splenomegaly, as well as atrial fibrillation,  previously on Coumadin therapy.  The patient was admitted to [facility] on April 15, 2011, with complaints of diarrhea with blood as well as bilateral shoulder pain.  In the ED at [facility], she apparently received benzodiazepines and was noted to exhibit a junctional escape rhythm with rates in the 30s.  The patient was treated with atropine with resolution and bradycardia and restoration sinus rhythm.  In the setting, she had no experience of presyncope or syncope. In the setting of her cardiac history with bradycardia, decision was made to transfer to [facility] for further cardiac evaluation.  HOSPITAL COURSE:  Upon arrival to the coronary intensive care unit at [facility], the patient was in sinus rhythm.  It is noted that she had previously been treated with amiodarone as well diltiazem and atenolol for history of paroxysmal atrial fibrillation and these were initially held.  The patient was anemic with H and H of 7.3 and 24.5, and it was not entirely clear.  The patient will require invasive evaluation, her Coumadin was held, also (admission INR was 1.05).  The patient was felt to exhibit significant volume overload and ascites on examination.  She was placed on Lasix 40 mg p.o. b.i.d. and did not require any IV diuresis.  Though she complained of weakness, she denied any chest pain or dyspnea.  In the setting of oral diuresis, the patient's creatinine was elevated at 0.06 on admission, has improved throughout the course of this admission and 0.95 today.  On admission, as stated above, the patient was anemic with an H and H of 7.3 and 24.5.  Off of anticoagulation, with normal INR, the patient's H and H drifted down to 6.6 and 22.2 by April 17, 2011.  She was noted to have fecal occult blood positive stool.  Records from [facility] were obtained with regard to extensive GI workup including invasive evaluation earlier this year with history of gastritis.  In light  of recurrent, progressive anemia, the patient was transfused with 1 unit of packed red blood cells during this admission and was also evaluated by [facility].  [facility] reviewed [facility] records and recommended continuation of iron therapy as well as PPI as well as an EGD.  EGD was undertaken on April 18, 2011, revealing multiple superficial erosions, found in the antrum of the stomach, but no evidence of active bleeding or blood.  Continue recommendation was made for PPI, iron therapy, transfusions, as clinically desired (this was not additionally required), and also regular followup with her gastroenterologist at [facility] for consideration of capsule endoscopy.  Following transfusion, H and H has remained stable.  A 2-D echocardiogram was performed on April 18, 2011, showing an  EF of 30% to 35% with suggestion of elevated filling pressures.  It was felt that the patient would benefit from further invasive evaluation of both right and left heart pressures as well as coronary angiography in the setting of CHF and known coronary disease.  As the patient's creatinine improved, she was felt to be stable for catheterization, which eventually took place on April 20, 2011, revealing 3/3 patent grafts with severe native 3-vessel disease, and severely elevated left heart filling pressures as outlined above.  Continue medical therapy was recommended and although the patient was not placed on ACE inhibitor, ARB, or spirolactone therapy, despite history of ischemic cardiomyopathy in the setting of renal insufficiency, we have continued home dose oral nitrate and have also added low-dose hydralazine for afterload reduction.  Finally, with regard to the patient's bradycardia, which prompted admission in the first place, her atenolol, diltiazem, and amiodarone have remained discontinued.  We have reinitiated low-dose carvedilol therapy and 6.25 mg b.i.d., which the patient has  tolerated without recurrence of bradycardia and without any evidence of atrial fibrillation.  We have not resumed Coumadin therapy at this time with ongoing issues regarding anemia and potential for additional GI evaluation in the outpatient setting.  We defer any additional anticoagulation to her primary cardiologist at [facility].  Ms. Sutherlin will be discharged home today in good condition.  LABORATORY DATA:  Discharge labs, hemoglobin 8.5, hematocrit 27.0, WBC 7.4, and platelets 203.  Sodium 139, potassium 4.2, chloride 102, CO2 of 27, BUN 25.95, glucose 155, total bilirubin 0.2, alkaline phosphatase 98, AST 15, ALT 13, total protein 6.2, albumin 2.9, calcium 8.8, and magnesium 2.0.  Hemoglobin A1c 6.9, CK 27, and MB 2.9.  Troponin-I less than 0.30.  T4 of 5.5, T3 of 49.8, iron 15, TIBC 405, B12 of 229, folate 13.2, ferritin 14, and total protein 6.6.  IgA 343, IgG 945, and IgM 141.  SPEP, albumin 51.1, alpha-1 5.8, alpha-2 of 14.4, beta 7.4, beta-2 of 6.1, and gammaglobulin 15.2.  Overall, felt to be nonspecific pattern.  UPEP, total protein 31.4, free kappa light chains 18.6, free kappa excretion per day 446.4, free lambda light chains 1.15, free lambda excretion per day 27.63, and free lambda ratio 16.17.  Fecal occult blood was positive.  Helicobacter screen was negative.  MRSA screen negative.  DISPOSITION:  The patient will be discharged home today in good condition.  Followup plans and appointments with outpatient followup with providers including primary care, Nephrology, and Cardiology within the next 1-2 weeks at [facility].  She will require close follow up of CBC in the setting of ongoing anemia.  DISCHARGE MEDICATIONS: 1. Coreg 6.25 mg b.i.d. 2. Hydralazine 10 mg t.i.d. 3. Ventolin inhaler 90 mcg per spray 2 puffs q.4 hours p.r.n. 4. Ferrous sulfate 325 mg t.i.d. 5. Lasix 40 mg daily. 6. Lantus 12 units daily. 7. Aspirin 81 mg daily. 8. Fosamax 70 mg every  week. 9. Glipizide 10 mg b.i.d. 10.Imdur 30 mg daily. 11.Multivitamin daily. 12.Prilosec OTC 1 tab b.i.d. 13.Spiriva 18 mcg daily.  OUTSTANDING LAB STUDIES:  Followup CBC with primary care.  DURATION DISCHARGE ENCOUNTER:  90 minutes including physician time.  Thank you for following this patient.     Nicolasa Ducking, ANP   ______________________________ Rollene Rotunda, MD, [facility]    CB/MEDQ  D:  04/21/2011  T:  04/21/2011  Job:  [id number]  cc:   Dr. Christella Noa Dr. Dione Housekeeper Dr. Aram Beecham Denu-Ciocca  Electronically Signed by Nicolasa Ducking ANP on 05/07/2011  03:16:42 PM Electronically Signed by Rollene Rotunda MD [facility] on 06/04/2011 01:32:08 PM

## 2011-08-10 ENCOUNTER — Other Ambulatory Visit: Payer: Self-pay | Admitting: Cardiovascular Disease

## 2011-08-12 ENCOUNTER — Other Ambulatory Visit: Payer: Self-pay | Admitting: Cardiovascular Disease

## 2011-10-19 ENCOUNTER — Ambulatory Visit: Payer: Self-pay

## 2012-08-25 IMAGING — CT CT ABD-PELV W/O CM
1 of 2 series · 14 of 32 positions shown, 18 images · non-contrast
Comparison: none

REASON FOR EXAM: COMMENTS:

PROCEDURE:     CT  - CT ABDOMEN AND PELVIS W[DATE]  [DATE]
RESULT:      CT abdomen and pelvis dated 06/24/2010.
TECHNIQUE: Helical noncontrasted 5 mm sections were obtained from the lung
bases through the pubic symphysis.

[Series 2: abdomen · axial · 0.64mm/px · z∈[-471,-81]mm · 14 of 89 slices shown, 18 images]
[im 7/89  soft-tissue]
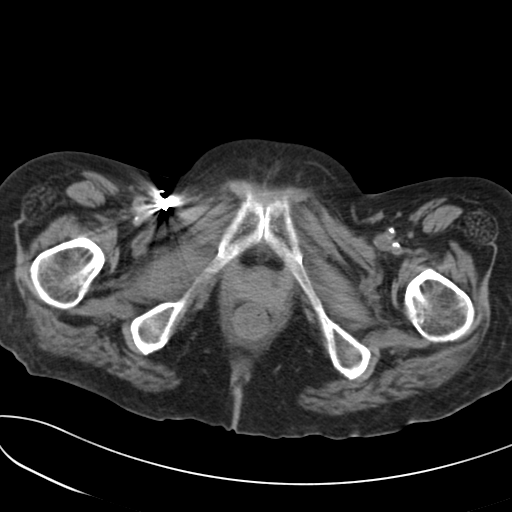
[im 7/89  bone]
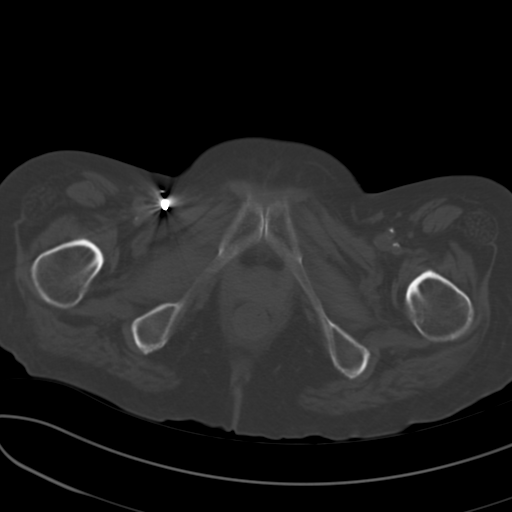
[im 14/89  soft-tissue]
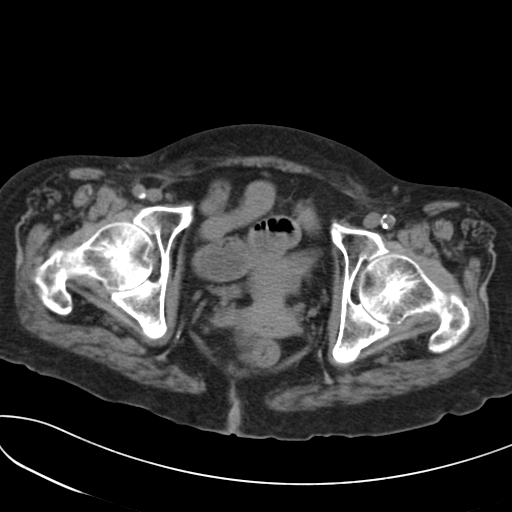
[im 21/89  soft-tissue]
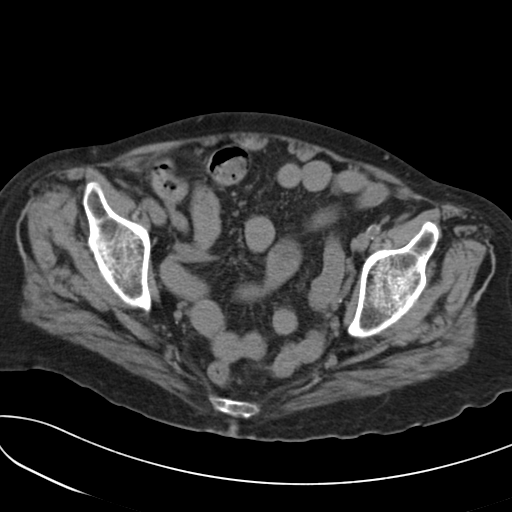
[im 28/89  soft-tissue]
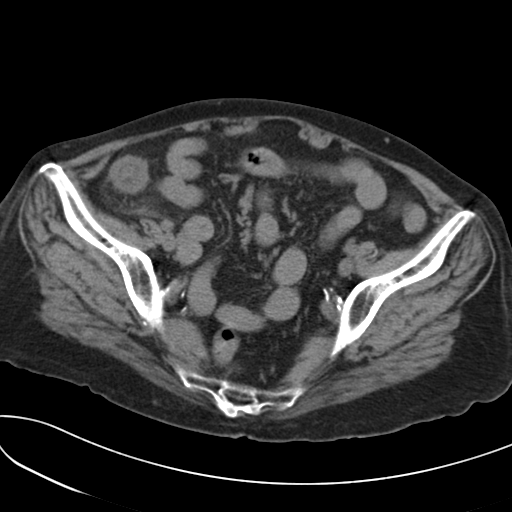
[im 34/89  soft-tissue]
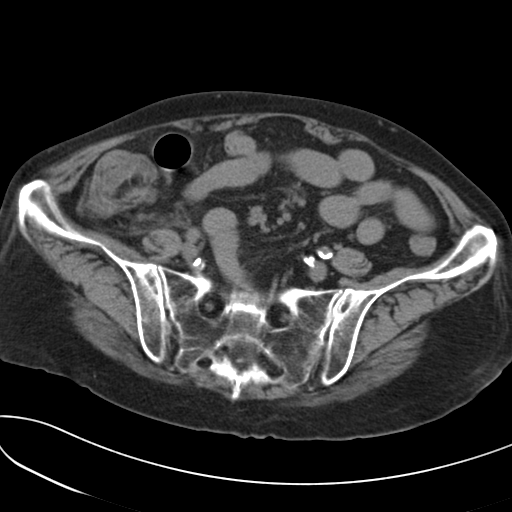
[im 41/89  soft-tissue]
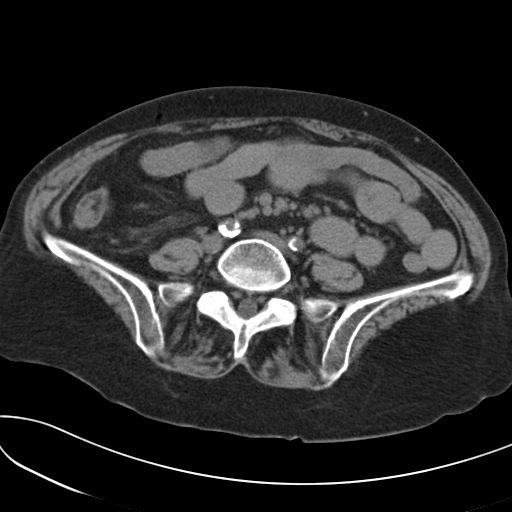
[im 48/89  soft-tissue]
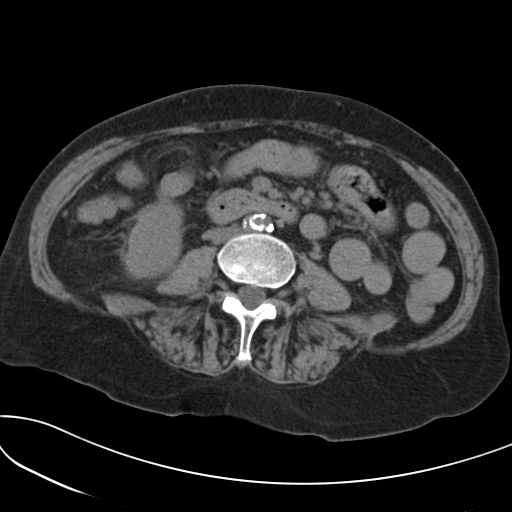
[im 55/89  soft-tissue]
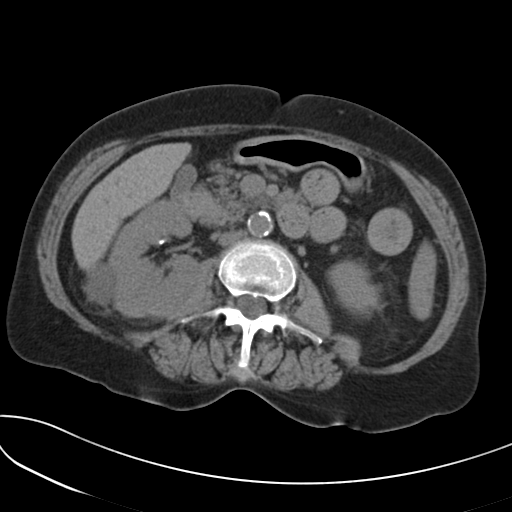
[im 61/89  soft-tissue]
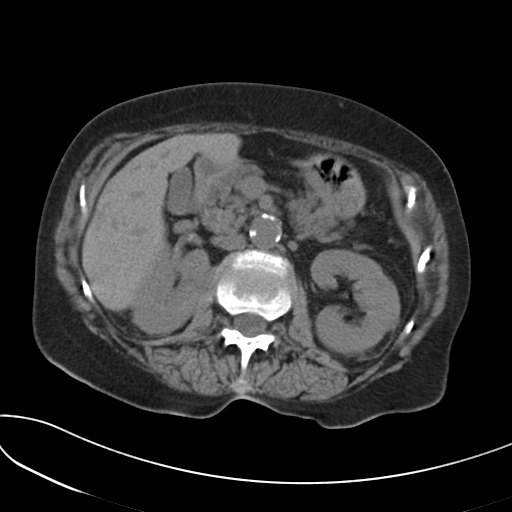
[im 61/89  bone]
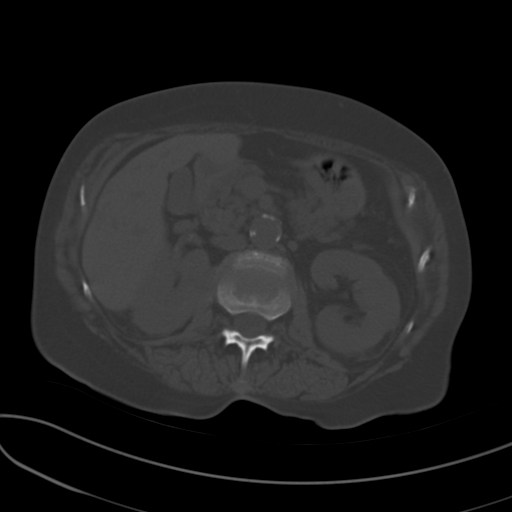
[im 68/89  soft-tissue]
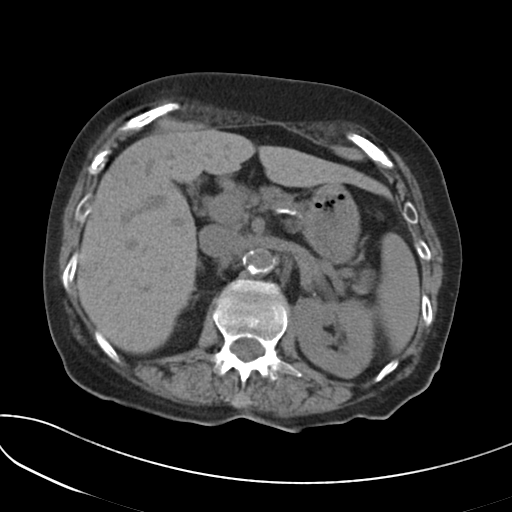
[im 75/89  soft-tissue]
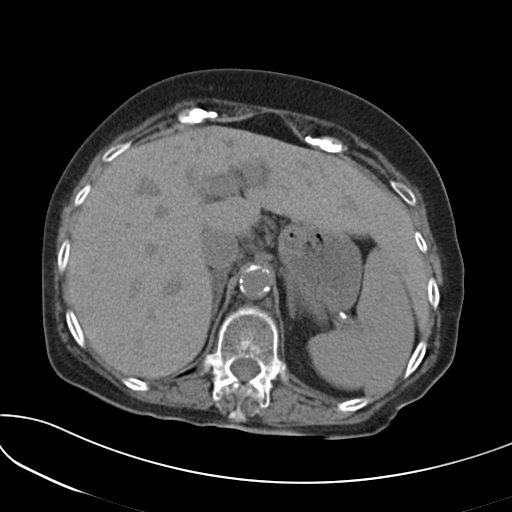
[im 75/89  lung]
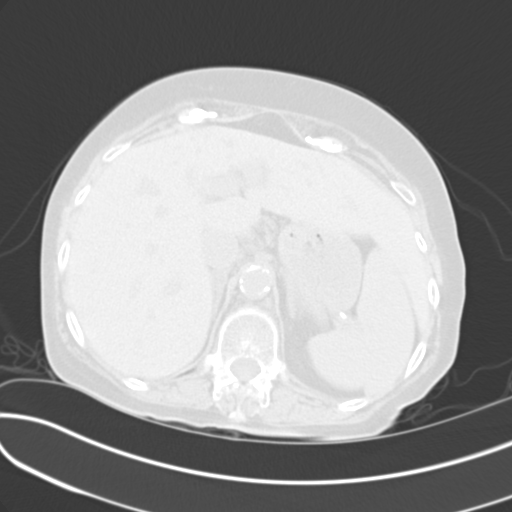
[im 78/89  lung]
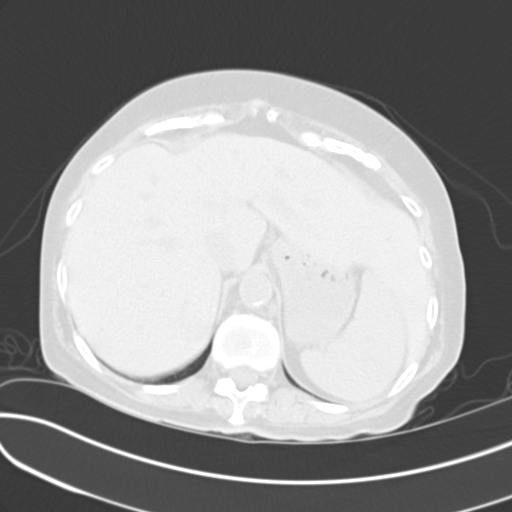
[im 82/89  soft-tissue]
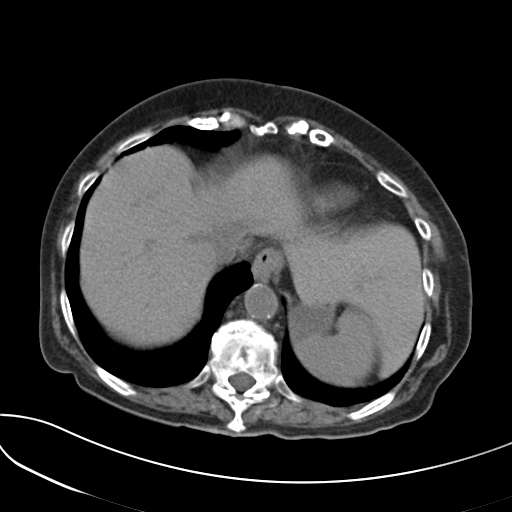
[im 82/89  lung]
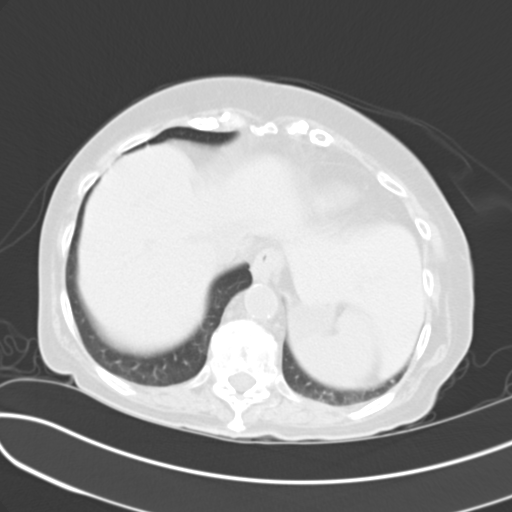
[im 85/89  lung]
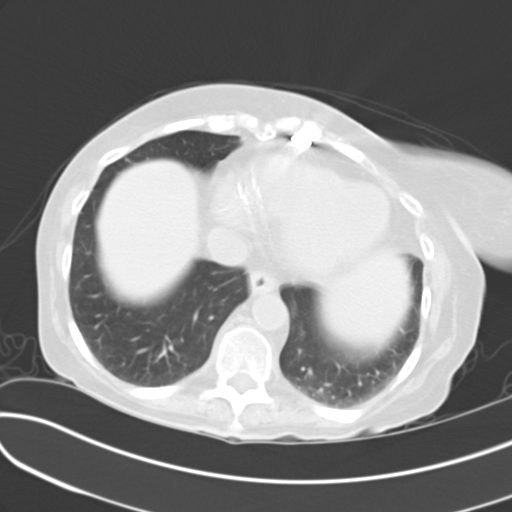

[14 of 32 positions shown; findings below may reference images not displayed]

FINDINGS: Evaluation lung bases demonstrates diffuse mild thickening of
interstitial markings. No further gross abnormalities identified.

The liver, spleen, adrenals, pancreas are unremarkable. Evaluation of the
kidneys demonstrates multiple low attenuating foci within the left kidney
life-threatening cyst. A single exophytic primarily low attenuating focus
projects along the posterior midpole region of the right kidney. This also
may represent a cyst. The there is can be possibly further characterized
with triphasic CT, MRI or possibly ultrasound. Bilateral nonobstructing
medullary calculi identified. There is no evidence of abdominal aortic
aneurysm.

Evaluation of the right paracolic gutter region demonstrates diffuse and
thickening of the cecal wall with stranding in the mesenteric fat. This
study is limited secondary to the lack of oral and IV contrast. The appendix
is identified. A small focus of increased density projects in the region of
the proximal appendix like representing an appendicolith. The appendix is
dilated measuring 8.9 mm in diameter. There is stranding within the
surrounding periappendiceal fat. These findings are appreciated on image is
65 through
70. There is no evidence of drainable loculated fluid collections. A trace
amount of fluid is appreciated within the mesenteric fat. There is no CT
evidence of bowel obstruction. No evidence of abdominal or pelvic masses are
appreciated.
IMPRESSION: Inflammatory changes in the region of the cecum as well as
and appears to be the appendix. Difference considerations are appendicitis
with subsequent extension into the cecum. Alternatively cecal inflammation,
inflammatory versus infectious with subsequent appendiceal inflammation is
also diagnostic consideration. There also findings consistent with a small
appendicolith within the proximal appendix.
2. These findings were discussed Dr. Ederlina of the [HOSPITAL]
the time of initial interpretation.
3. Nonobstructing renal calculi as well as possibly bilateral renal cyst.

Addendum: The second sentence of the first should read as follows: Multiple
low attenuating foci are appreciated within the left kidney likely
representing small renal cysts. Not life-threatening cyst.

## 2012-08-27 IMAGING — US US RENAL KIDNEY
1 series · 17 of 25 positions shown · non-contrast
Comparison: none

REASON FOR EXAM: CKD st 3
COMMENTS:

[Series 1: us renal kidney · 17 of 49 slices shown]
[im 1/49]
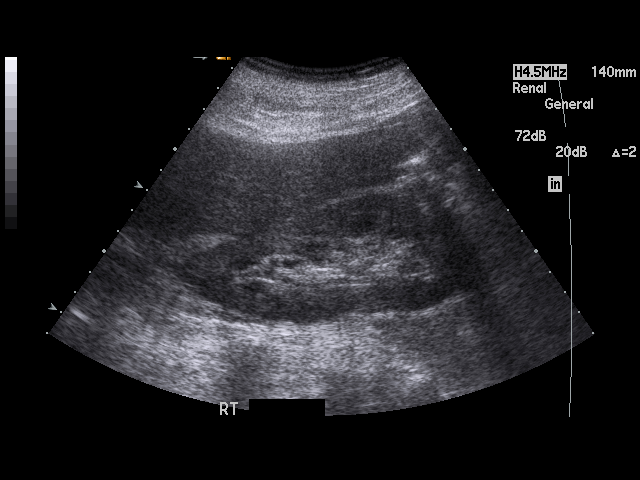
[im 5/49]
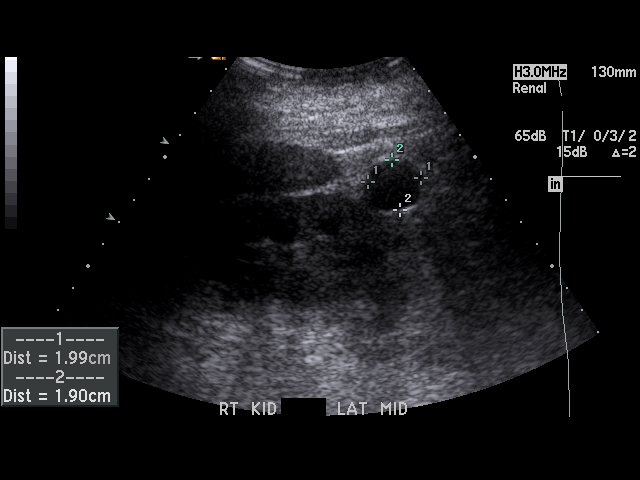
[im 7/49]
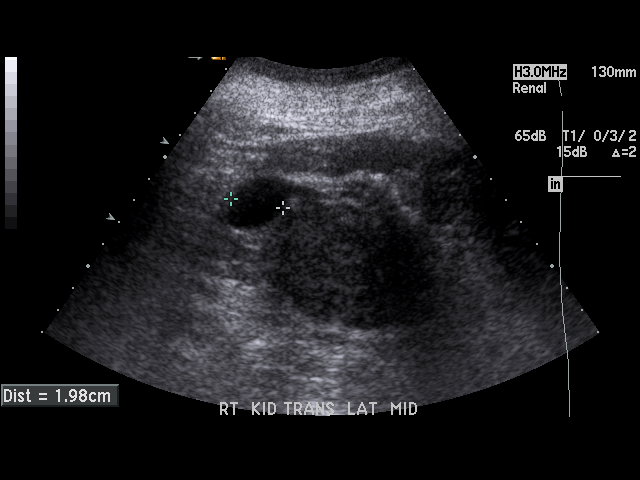
[im 11/49]
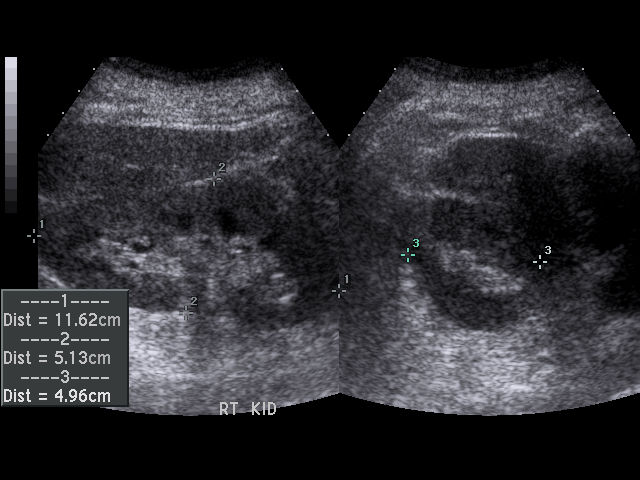
[im 13/49]
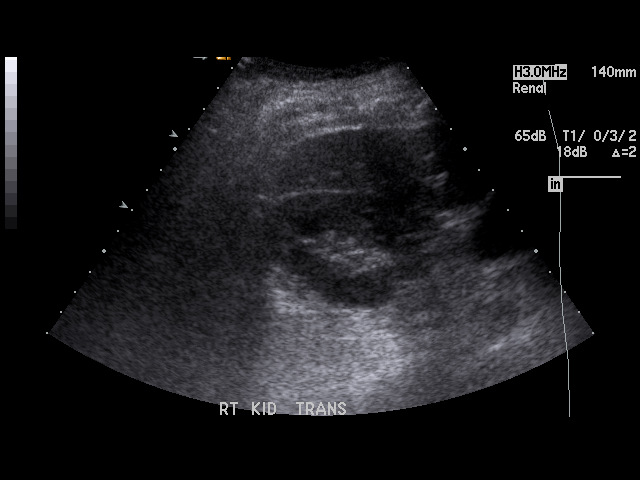
[im 17/49]
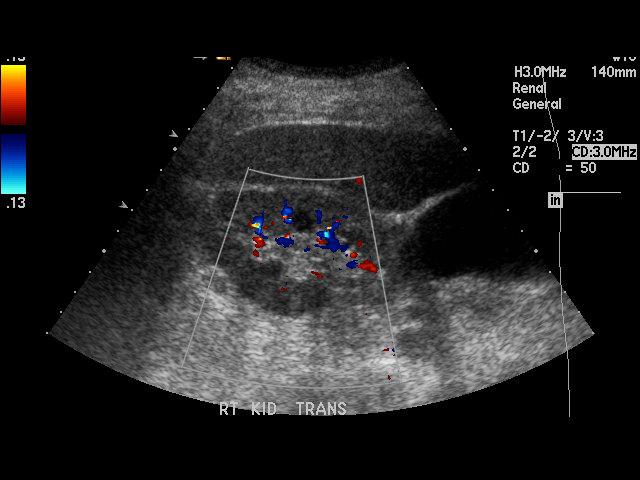
[im 19/49]
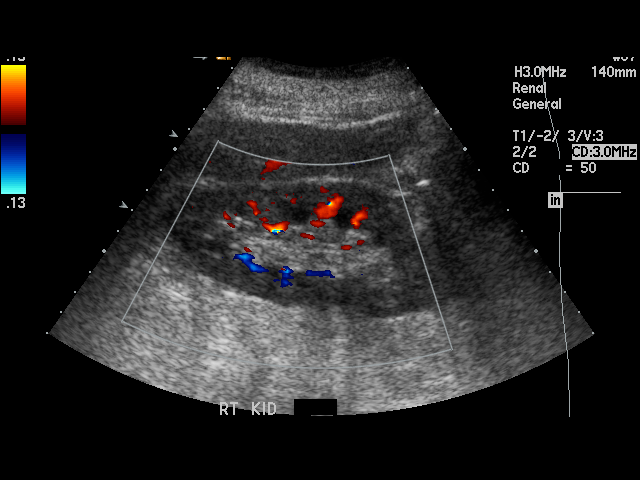
[im 23/49]
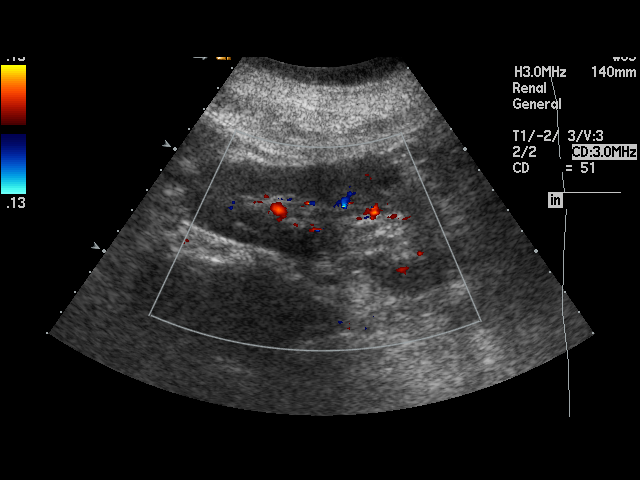
[im 25/49]
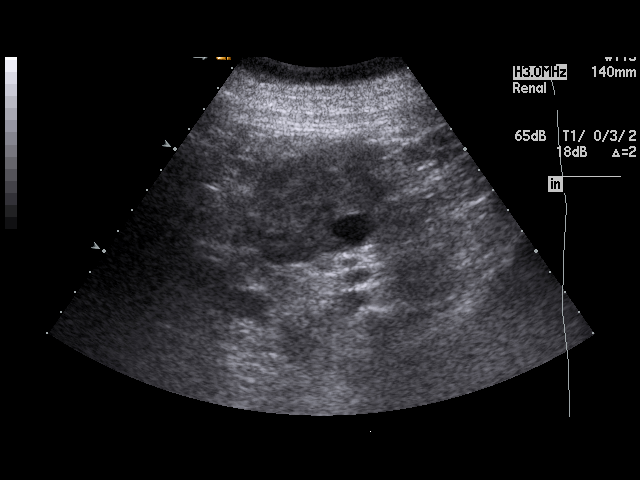
[im 27/49]
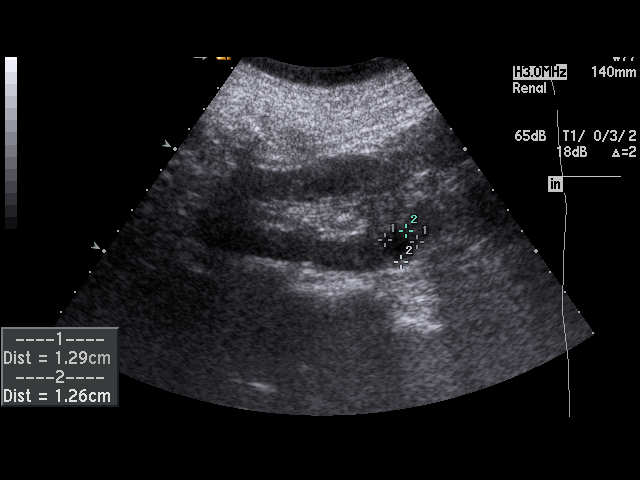
[im 31/49]
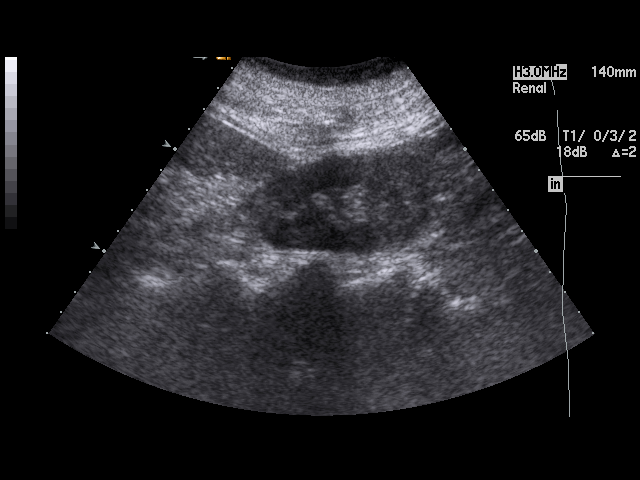
[im 33/49]
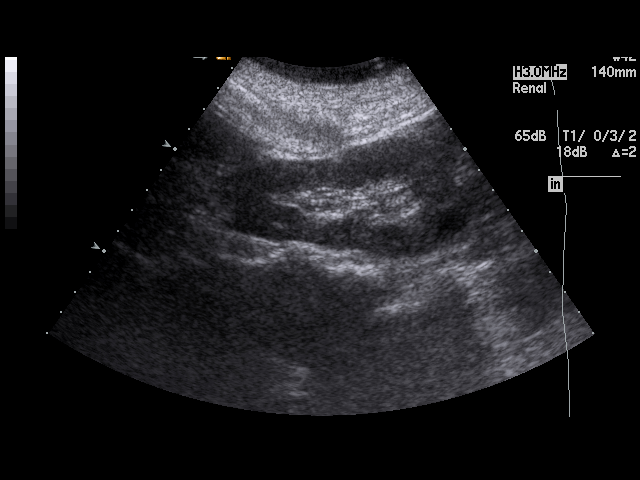
[im 37/49]
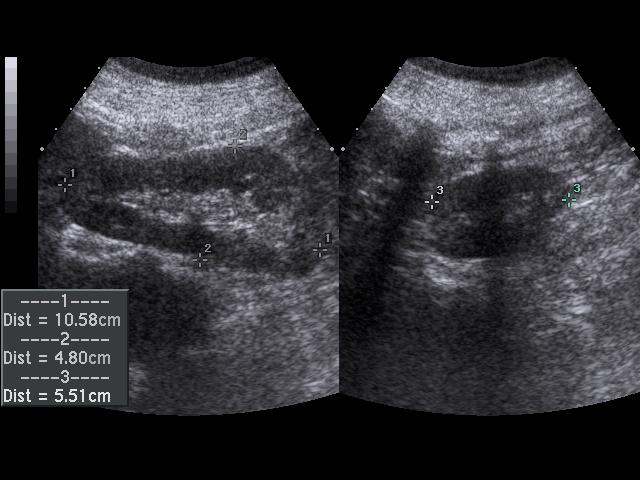
[im 39/49]
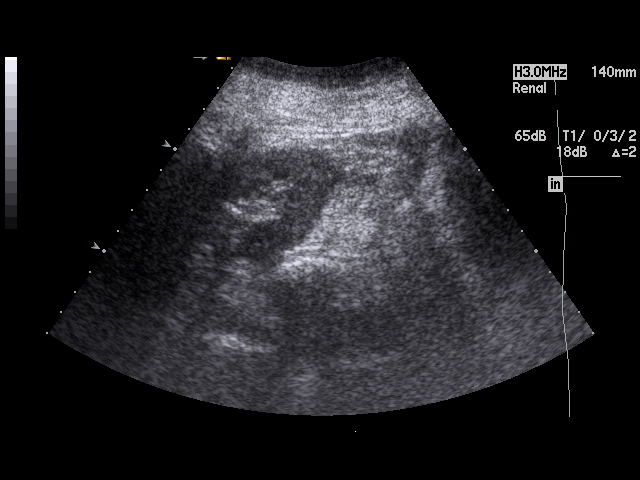
[im 43/49]
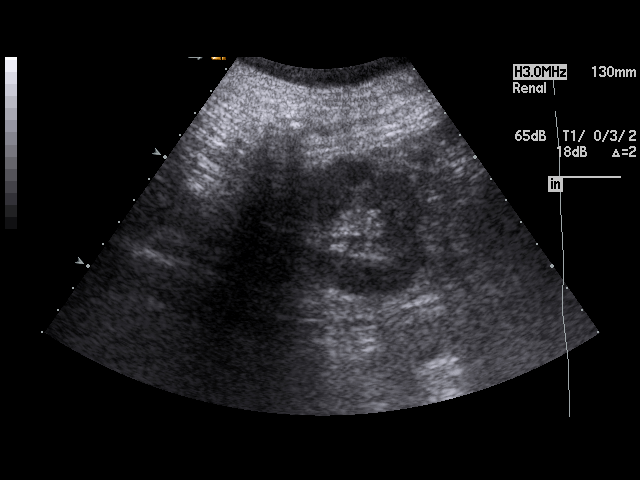
[im 45/49]
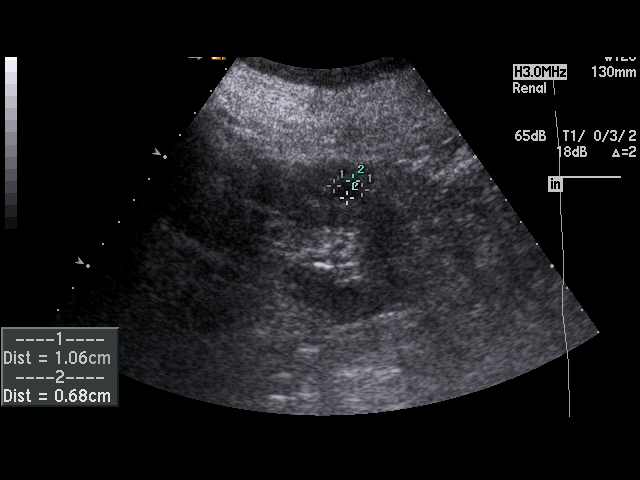
[im 49/49]
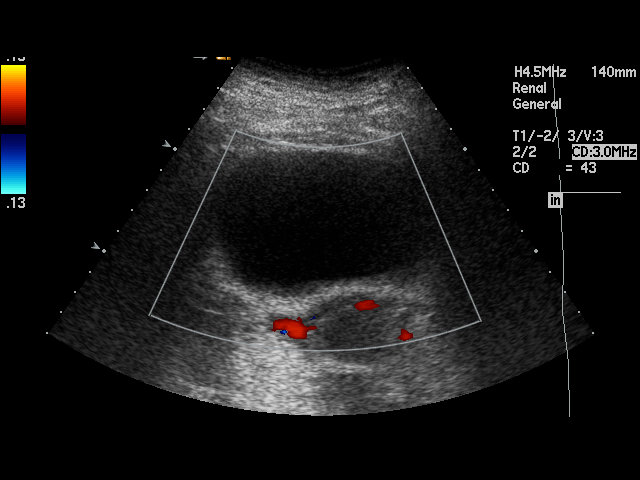

[17 of 25 positions shown; findings below may reference images not displayed]

PROCEDURE:     US  - US KIDNEY  - June 26, 2010  [DATE]

RESULT:     Renal sonogram demonstrates the right kidney measures 11.6 x
x 4.96 cm. The left measures 10.6 x 4.8 x 5.5 cm. There is a single cyst in
the mid right kidney exophytic in nature measuring 2 cm in diameter. Two
cysts are seen in the left kidney with one in the midpole region measuring
approximately 8 mm in diameter and a slightly larger cyst inferiorly
measuring up to 1.5 cm. Urine is present within the bladder. There is no
obstruction, solid mass or stone evident.
IMPRESSION: Bilateral renal cysts.

## 2012-08-29 IMAGING — CR DG CHEST 1V PORT
1 series · 1 of 1 positions shown · non-contrast
Comparison: none

REASON FOR EXAM: pulmonary edema follow up
COMMENTS:

[view not recorded]
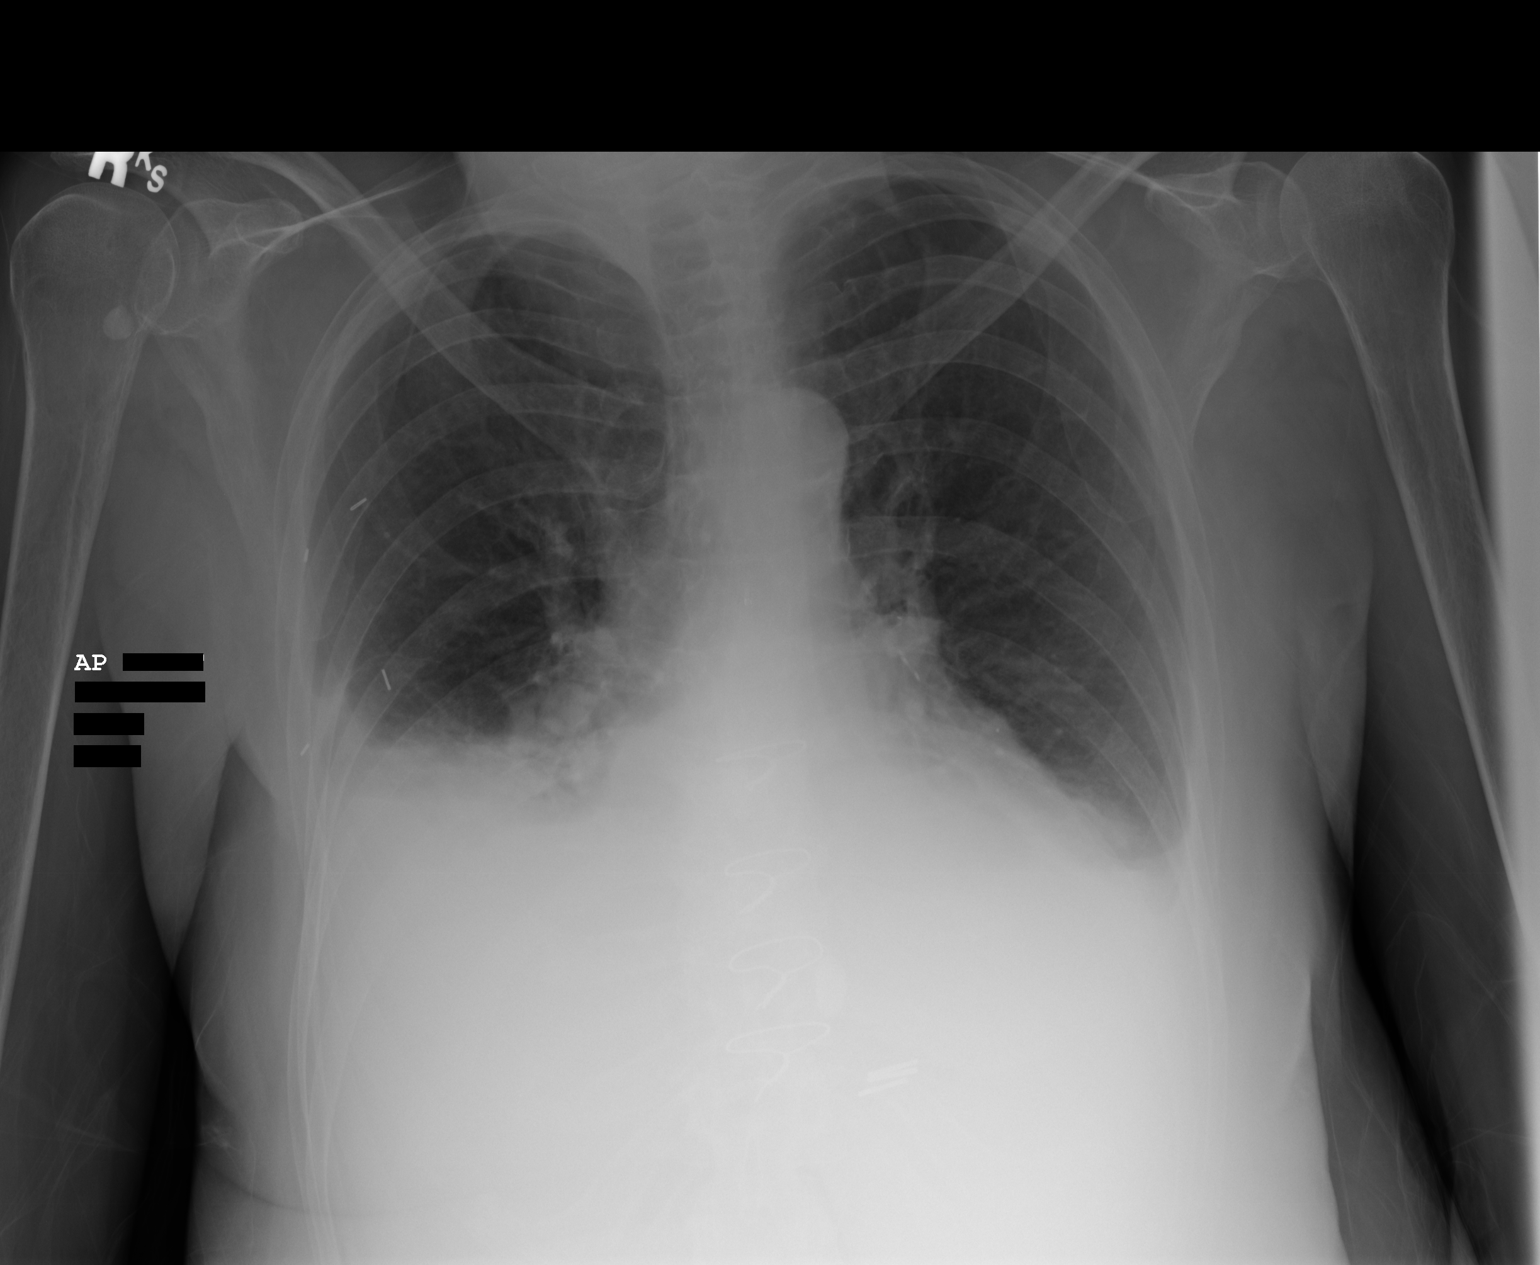

[1 of 1 positions shown; findings below may reference images not displayed]

PROCEDURE:     DXR - DXR PORTABLE CHEST SINGLE VIEW  - June 28, 2010 [DATE]

RESULT:     Comparison is made to study 27 June, 2010.

The lung volumes remain mildly decreased. The interstitial markings have
improved since yesterday's study. Bibasilar atelectasis and likely small
amounts of pleural fluid are present bilaterally. The pulmonary vascularity
is less prominent today.
IMPRESSION: There is an improved appearance of both lungs but there are
persistent small effusions at the lung bases as well as bibasilar
atelectasis. When the patient can tolerate the procedure, a PA and lateral
chest x-ray would be of value.

## 2013-06-18 ENCOUNTER — Emergency Department: Payer: Self-pay | Admitting: Emergency Medicine

## 2014-07-09 ENCOUNTER — Ambulatory Visit: Payer: Self-pay | Admitting: Radiation Oncology

## 2014-07-24 ENCOUNTER — Ambulatory Visit: Payer: Self-pay | Admitting: Radiation Oncology

## 2014-07-30 LAB — CBC CANCER CENTER
Basophil #: 0 x10 3/mm (ref 0.0–0.1)
Basophil %: 0.5 %
EOS PCT: 1.7 %
Eosinophil #: 0.1 x10 3/mm (ref 0.0–0.7)
HCT: 37.3 % (ref 35.0–47.0)
HGB: 12.6 g/dL (ref 12.0–16.0)
Lymphocyte #: 1.3 x10 3/mm (ref 1.0–3.6)
Lymphocyte %: 18.5 %
MCH: 31.4 pg (ref 26.0–34.0)
MCHC: 33.9 g/dL (ref 32.0–36.0)
MCV: 93 fL (ref 80–100)
Monocyte #: 0.4 x10 3/mm (ref 0.2–0.9)
Monocyte %: 6.1 %
NEUTROS PCT: 73.2 %
Neutrophil #: 5.2 x10 3/mm (ref 1.4–6.5)
PLATELETS: 149 x10 3/mm — AB (ref 150–440)
RBC: 4.03 10*6/uL (ref 3.80–5.20)
RDW: 13.3 % (ref 11.5–14.5)
WBC: 7.1 x10 3/mm (ref 3.6–11.0)

## 2014-08-06 LAB — CBC CANCER CENTER
Basophil #: 0 x10 3/mm (ref 0.0–0.1)
Basophil %: 0.3 %
EOS ABS: 0.1 x10 3/mm (ref 0.0–0.7)
Eosinophil %: 1.6 %
HCT: 39.2 % (ref 35.0–47.0)
HGB: 12.9 g/dL (ref 12.0–16.0)
LYMPHS ABS: 1.4 x10 3/mm (ref 1.0–3.6)
LYMPHS PCT: 19 %
MCH: 31.9 pg (ref 26.0–34.0)
MCHC: 33 g/dL (ref 32.0–36.0)
MCV: 97 fL (ref 80–100)
MONOS PCT: 7 %
Monocyte #: 0.5 x10 3/mm (ref 0.2–0.9)
NEUTROS ABS: 5.4 x10 3/mm (ref 1.4–6.5)
NEUTROS PCT: 72.1 %
PLATELETS: 144 x10 3/mm — AB (ref 150–440)
RBC: 4.06 10*6/uL (ref 3.80–5.20)
RDW: 13.1 % (ref 11.5–14.5)
WBC: 7.5 x10 3/mm (ref 3.6–11.0)

## 2014-08-13 LAB — CBC CANCER CENTER
BASOS ABS: 0 x10 3/mm (ref 0.0–0.1)
Basophil %: 0.5 %
EOS ABS: 0.1 x10 3/mm (ref 0.0–0.7)
EOS PCT: 2 %
HCT: 38.7 % (ref 35.0–47.0)
HGB: 13 g/dL (ref 12.0–16.0)
LYMPHS ABS: 0.9 x10 3/mm — AB (ref 1.0–3.6)
LYMPHS PCT: 15.8 %
MCH: 31.4 pg (ref 26.0–34.0)
MCHC: 33.5 g/dL (ref 32.0–36.0)
MCV: 94 fL (ref 80–100)
MONO ABS: 0.4 x10 3/mm (ref 0.2–0.9)
MONOS PCT: 6.4 %
Neutrophil #: 4.4 x10 3/mm (ref 1.4–6.5)
Neutrophil %: 75.3 %
PLATELETS: 134 x10 3/mm — AB (ref 150–440)
RBC: 4.13 10*6/uL (ref 3.80–5.20)
RDW: 13.3 % (ref 11.5–14.5)
WBC: 5.8 x10 3/mm (ref 3.6–11.0)

## 2014-08-20 LAB — CBC CANCER CENTER
BASOS ABS: 0 x10 3/mm (ref 0.0–0.1)
Basophil %: 0.6 %
Eosinophil #: 0.1 x10 3/mm (ref 0.0–0.7)
Eosinophil %: 2.2 %
HCT: 37.2 % (ref 35.0–47.0)
HGB: 12.3 g/dL (ref 12.0–16.0)
LYMPHS ABS: 0.9 x10 3/mm — AB (ref 1.0–3.6)
Lymphocyte %: 16 %
MCH: 31.3 pg (ref 26.0–34.0)
MCHC: 33.2 g/dL (ref 32.0–36.0)
MCV: 94 fL (ref 80–100)
MONO ABS: 0.4 x10 3/mm (ref 0.2–0.9)
Monocyte %: 6.4 %
NEUTROS PCT: 74.8 %
Neutrophil #: 4.3 x10 3/mm (ref 1.4–6.5)
Platelet: 132 x10 3/mm — ABNORMAL LOW (ref 150–440)
RBC: 3.94 10*6/uL (ref 3.80–5.20)
RDW: 13.6 % (ref 11.5–14.5)
WBC: 5.7 x10 3/mm (ref 3.6–11.0)

## 2014-08-24 ENCOUNTER — Ambulatory Visit: Payer: Self-pay | Admitting: Radiation Oncology

## 2014-08-27 LAB — CBC CANCER CENTER
BASOS PCT: 0.6 %
Basophil #: 0 x10 3/mm (ref 0.0–0.1)
EOS ABS: 0.1 x10 3/mm (ref 0.0–0.7)
Eosinophil %: 1.9 %
HCT: 37.9 % (ref 35.0–47.0)
HGB: 12.9 g/dL (ref 12.0–16.0)
LYMPHS PCT: 14.2 %
Lymphocyte #: 1 x10 3/mm (ref 1.0–3.6)
MCH: 32.2 pg (ref 26.0–34.0)
MCHC: 34.1 g/dL (ref 32.0–36.0)
MCV: 94 fL (ref 80–100)
Monocyte #: 0.5 x10 3/mm (ref 0.2–0.9)
Monocyte %: 6.9 %
NEUTROS PCT: 76.4 %
Neutrophil #: 5.6 x10 3/mm (ref 1.4–6.5)
Platelet: 136 x10 3/mm — ABNORMAL LOW (ref 150–440)
RBC: 4.02 10*6/uL (ref 3.80–5.20)
RDW: 13.6 % (ref 11.5–14.5)
WBC: 7.4 x10 3/mm (ref 3.6–11.0)

## 2014-09-03 LAB — CBC CANCER CENTER
BASOS ABS: 0 x10 3/mm (ref 0.0–0.1)
Basophil %: 0.3 %
Eosinophil #: 0.1 x10 3/mm (ref 0.0–0.7)
Eosinophil %: 1.4 %
HCT: 38.3 % (ref 35.0–47.0)
HGB: 13 g/dL (ref 12.0–16.0)
LYMPHS ABS: 0.9 x10 3/mm — AB (ref 1.0–3.6)
Lymphocyte %: 10 %
MCH: 31.9 pg (ref 26.0–34.0)
MCHC: 34 g/dL (ref 32.0–36.0)
MCV: 94 fL (ref 80–100)
MONO ABS: 0.6 x10 3/mm (ref 0.2–0.9)
Monocyte %: 6.8 %
Neutrophil #: 7.3 x10 3/mm — ABNORMAL HIGH (ref 1.4–6.5)
Neutrophil %: 81.5 %
PLATELETS: 143 x10 3/mm — AB (ref 150–440)
RBC: 4.09 10*6/uL (ref 3.80–5.20)
RDW: 13.4 % (ref 11.5–14.5)
WBC: 9 x10 3/mm (ref 3.6–11.0)

## 2014-09-24 ENCOUNTER — Ambulatory Visit: Payer: Self-pay | Admitting: Radiation Oncology

## 2014-12-15 NOTE — Consult Note (Signed)
Reason for Visit: This 69 year old Female patient presents to the clinic for initial evaluation of  breast cancer .  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 She underwent ultrasound-guided biopsy showing invasive mammary carcinoma. Patient has a history of right-sided breast cancer status post right modified radical mastectomy back in 1983 no adjuvant treatment given. She underwent a wide local excision of left breast and sentinel node biopsy. 2 nodes were negative. Tumor was 2.9 cm with margins clear but close. Tumor was ER positive PR negative HER-2/neu not overexpressed. She has been seen by medical oncology Oncotype DX has been performed and results are pending. She has a history of coronary artery bypass. Also family history of relative with chemotherapy who died of a heart attack patient is adamantly against chemotherapy.she is seen today and doing well. She specifically denies breast tenderness cough or bone pain.  Past Hx:    diastolic heart failure:    Breast Cancer:    Diabetes: Type II   Osteoporosis:    chronic kidney disease:    afib:    htn:    enlarged liver:    Appendectomy:    CABG (Coronary Artery Bypass Graft):    thyroid surgery:    right mastectomy:   Past, Family and Social History:  Past Medical History positive   Cardiovascular atrial fibrillation; CABG performed; coronary artery disease; hypertension; diastolic heart failure    Genitourinary chronic renal disease   Endocrine diabetes mellitus   Past Surgical History appendectomy; right mastectomy, thyroidectomy   Past Medical History Comments osteoporosis   Family History positive   Family History Comments Sr. with breast cancer, family history of adult-onset diabetes cardiovascular heart disease and hypertension.   Social History positive   Social History Comments greater than 50-pack-year smoking history continues to smoke. No EtOH abuse history   Additional Past Medical and Surgical History company by multiple family members today   Allergies:   Morphine: Hallucinations  Benadryl: Bradycardia  Sulfa drugs: Unknown  Home Meds:  Home Medications: Medication Instructions Status  isosorbide dinitrate 30 mg oral tablet tab(s) orally once a day Active  glipiZIDE 10 mg oral tablet tab(s) orally 2 times a day Active  Prilosec  orally  Active  aspirin  orally  Active  Spiriva  inhaled  Active  atorvastatin 40 mg oral tablet 1 tab(s) orally once a day (at bedtime) Active  carvedilol 25 mg oral tablet 1 tab(s) orally 2 times a day Active  citalopram 20 mg oral tablet 1 tab(s) orally once a day Active  EPINEPHrine 0.15 mg injectable kit  injectable  Active  ferrous sulfate 325 mg oral delayed release tablet 1 tab(s) orally once a day Active  hydrALAZINE 10 mg oral tablet 1 tab(s) orally 3 times a day  Active  Lantus 100 units/mL subcutaneous solution 10 unit(s) subcutaneous once a day Active   Review of Systems:  General negative   Performance Status (ECOG) 0   Skin negative   Breast see HPI  Ophthalmologic negative   ENMT negative   Respiratory and Thorax negative   Cardiovascular negative   Gastrointestinal negative   Genitourinary negative   Musculoskeletal negative   Neurological negative   Psychiatric negative   Hematology/Lymphatics negative   Endocrine negative   Allergic/Immunologic negative   Review of Systems    denies any weight loss, fatigue, weakness, fever, chills or night sweats. Patient denies any loss of vision, blurred vision. Patient denies any ringing  of the ears or hearing loss. No irregular heartbeat. Patient denies heart murmur or history of fainting. Patient denies any chest pain or pain radiating to her upper extremities. Patient denies any shortness of breath, difficulty breathing at night, cough or hemoptysis. Patient denies any swelling in the lower legs. Patient denies any nausea vomiting, vomiting of blood, or coffee ground material in the vomitus. Patient denies any stomach pain. Patient states has had normal bowel movements no significant constipation or diarrhea. Patient denies any dysuria, hematuria or significant nocturia. Patient denies any problems walking, swelling in the joints or loss of balance. Patient denies any skin changes, loss of hair or loss of weight. Patient denies any excessive worrying or anxiety or significant depression. Patient denies any problems with insomnia. Patient denies excessive thirst, polyuria, polydipsia. Patient denies any swollen glands, patient denies easy bruising or easy bleeding. Patient denies any recent infections, allergies or URI. Patient "s visual fields have not changed significantly in recent time.   Nursing Notes:  Nursing Vital Signs and Chemo Nursing Nursing Notes: *CC Vital Signs Flowsheet:   16-Nov-15 10:57  Temp Temperature 97.8  Pulse Pulse 69  Respirations Respirations 21  SBP SBP 151  DBP DBP 68  Current Weight (kg) (kg) 55.5   Physical Exam:  General/Skin/HEENT:  General normal   Skin normal   Eyes normal   ENMT normal   Head and Neck normal   Additional PE well-developed female in NAD. She status post a right modified radical mastectomy chest wall is clear without evidence of nodularity or mass. Left breast has a well-healed excision site at approximately 10:00 position. No dominant mass or nodularity is noted in  the left breast in 2 positions. No axillary or supraclavicular adenopathy is appreciated.   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 Plan:   at this time account over recommendations for whole breast radiation therapy. She does have large pendulous breast which would make hypofractionated treatments difficult. We'll plan on delivering 5000 cGy over 5 weeks to whole breast boosting her scar another 1600 cGy using electron beam based on the close margin. Risks and benefits of treatment including skin reaction, fatigue, inclusion of small area of superficial lung, alteration blood counts all were explained in detail to the patient. Will be particularly careful avoiding heart based on her coronary history. I've also discussed the case personally with her medical oncologist and Oncotype DX is pending. They will call us with that result and whether they need to see her again for evaluation should chemotherapy be strongly indicated. The patient is adamant  about not wanting to receive chemotherapy and will have that discussion should her Oncotype DX come out with a high score for risk of recurrence. I have set her up for treatment planning later this week did not anticipate starting her treatments after the holiday.  I would like to take this opportunity for allowing me to participate in the care of your patient..  CC Referral:  cc: Tere[facility],Timothy [facility]   Electronic  Signatures: Armstead Peaks (MD)  (Signed [phone] 12:50)  Authored: HPI, Diagnosis, Past Hx, PFSH, Allergies, Home Meds, ROS, Nursing Notes, Physical Exam, Relevent Results, Encounter Assessment and Plan, CC Referring Physician   Last Updated: 16-Nov-15 12:50 by Armstead Peaks (MD)

## 2015-04-15 ENCOUNTER — Ambulatory Visit
Admission: RE | Admit: 2015-04-15 | Discharge: 2015-04-15 | Disposition: A | Discharge status: Home / Self Care | Payer: Medicare Other | Source: Ambulatory Visit | Attending: Radiation Oncology | Admitting: Radiation Oncology

## 2015-04-15 ENCOUNTER — Encounter: Payer: Self-pay | Admitting: Radiation Oncology

## 2015-04-15 VITALS — BP 131/59 | HR 77 | Temp 97.2°F | Resp 18 | Ht 64.0 in | Wt 119.7 lb

## 2015-04-15 DIAGNOSIS — C50212 Malignant neoplasm of upper-inner quadrant of left female breast: Secondary | ICD-10-CM

## 2015-04-15 HISTORY — DX: Major depressive disorder, single episode, unspecified: F32.9

## 2015-04-15 HISTORY — DX: Essential (primary) hypertension: I10

## 2015-04-15 HISTORY — DX: Malignant neoplasm of unspecified site of unspecified female breast: C50.919

## 2015-04-15 HISTORY — DX: Depression, unspecified: F32.A

## 2015-04-15 HISTORY — DX: Type 2 diabetes mellitus without complications: E11.9

## 2015-04-15 HISTORY — DX: Allergy, unspecified, initial encounter: T78.40XA

## 2015-04-15 NOTE — Progress Notes (Signed)
Radiation Oncology Follow up Note  Name: Anita Wilson   Date:   04/15/2015 MRN:  [id number] DOB: April 19, 1946    This 69 y.o. 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  REFERRING PROVIDER: No ref. provider found  HPI: Patient is a 69 year old female now out 7 months having completed whole breast radiation therapy to her left breast for a stage IIa invasive mammary carcinoma ER positive PR negative HER-2/neu not overexpressed. She is seen today in routine follow-up and is doing well. She is status post mastectomy of the right breast back in 1983. She's been tried of multiple aromatase inhibitors although all of them causing some side effects or complaints currently being seen for recognition of another aromatase inhibitor. She specifically denies left-sided breast pain cough or bone pain..  COMPLICATIONS OF TREATMENT: none  FOLLOW UP COMPLIANCE: keeps appointments   PHYSICAL EXAM:  BP 131/59 mmHg  Pulse 77  Temp(Src) 97.2 F (36.2 C)  Resp 18  Ht '5\' 4"'  (1.626 m)  Wt 119 lb 11.4 oz (54.3 kg)  BMI 20.54 kg/m2 Well-developed female in NAD. She status post right modified radical mastectomy chest wall is clear. Left breast is free of dominant mass or nodularity in 2 positions examined. No axillary or supraclavicular adenopathy is identified. Well-developed well-nourished patient in NAD. HEENT reveals PERLA, EOMI, discs not visualized.  Oral cavity is clear. No oral mucosal lesions are identified. Neck is clear without evidence of cervical or supraclavicular adenopathy. Lungs are clear to A&P. Cardiac examination is essentially unremarkable with regular rate and rhythm without murmur rub or thrill. Abdomen is benign with no organomegaly or masses noted. Motor sensory and DTR levels are equal and symmetric in the upper  and lower extremities. Cranial nerves II through XII are grossly intact. Proprioception is intact. No peripheral adenopathy or edema is identified. No motor or sensory levels are noted. Crude visual fields are within normal range.   RADIOLOGY RESULTS: She is scheduled for left-sided mammogram in September.  PLAN: At the present time she is continues to do well with no evidence of disease. I am please were overall progress. I've asked to see her back in 1 year for follow-up. She knows to call sooner with any concerns.  I would like to take this opportunity for allowing me to participate in the care of your patient.Armstead Peaks., MD

## 2015-07-04 ENCOUNTER — Encounter: Payer: Self-pay | Admitting: Emergency Medicine

## 2015-07-04 ENCOUNTER — Emergency Department: Payer: Medicare Other

## 2015-07-04 ENCOUNTER — Emergency Department
Admission: EM | Admit: 2015-07-04 | Discharge: 2015-07-04 | Disposition: A | Discharge status: Home / Self Care | Payer: Medicare Other | Attending: Emergency Medicine | Admitting: Emergency Medicine

## 2015-07-04 DIAGNOSIS — Z794 Long term (current) use of insulin: Secondary | ICD-10-CM | POA: Diagnosis not present

## 2015-07-04 DIAGNOSIS — Y9389 Activity, other specified: Secondary | ICD-10-CM | POA: Diagnosis not present

## 2015-07-04 DIAGNOSIS — S8011XA Contusion of right lower leg, initial encounter: Secondary | ICD-10-CM

## 2015-07-04 DIAGNOSIS — Y998 Other external cause status: Secondary | ICD-10-CM | POA: Insufficient documentation

## 2015-07-04 DIAGNOSIS — Z7984 Long term (current) use of oral hypoglycemic drugs: Secondary | ICD-10-CM | POA: Insufficient documentation

## 2015-07-04 DIAGNOSIS — Z7951 Long term (current) use of inhaled steroids: Secondary | ICD-10-CM | POA: Insufficient documentation

## 2015-07-04 DIAGNOSIS — Y92009 Unspecified place in unspecified non-institutional (private) residence as the place of occurrence of the external cause: Secondary | ICD-10-CM | POA: Insufficient documentation

## 2015-07-04 DIAGNOSIS — I10 Essential (primary) hypertension: Secondary | ICD-10-CM | POA: Insufficient documentation

## 2015-07-04 DIAGNOSIS — E119 Type 2 diabetes mellitus without complications: Secondary | ICD-10-CM | POA: Insufficient documentation

## 2015-07-04 DIAGNOSIS — Z720 Tobacco use: Secondary | ICD-10-CM | POA: Diagnosis not present

## 2015-07-04 DIAGNOSIS — Z7982 Long term (current) use of aspirin: Secondary | ICD-10-CM | POA: Insufficient documentation

## 2015-07-04 DIAGNOSIS — S7011XA Contusion of right thigh, initial encounter: Secondary | ICD-10-CM | POA: Insufficient documentation

## 2015-07-04 DIAGNOSIS — Z79899 Other long term (current) drug therapy: Secondary | ICD-10-CM | POA: Diagnosis not present

## 2015-07-04 DIAGNOSIS — W010XXA Fall on same level from slipping, tripping and stumbling without subsequent striking against object, initial encounter: Secondary | ICD-10-CM | POA: Diagnosis not present

## 2015-07-04 DIAGNOSIS — S79921A Unspecified injury of right thigh, initial encounter: Secondary | ICD-10-CM | POA: Diagnosis present

## 2015-07-04 MED ORDER — OXYCODONE-ACETAMINOPHEN 5-325 MG PO TABS
2.0000 | ORAL_TABLET | Freq: Once | ORAL | Status: AC
  Administered 2015-07-04: 2 via ORAL
  Filled 2015-07-04: qty 2

## 2015-07-04 MED ORDER — KETOROLAC TROMETHAMINE 60 MG/2ML IM SOLN
60.0000 mg | Freq: Once | INTRAMUSCULAR | Status: AC
  Administered 2015-07-04: 60 mg via INTRAMUSCULAR
  Filled 2015-07-04: qty 2

## 2015-07-04 MED ORDER — OXYCODONE-ACETAMINOPHEN 5-325 MG PO TABS: 1.0000 | ORAL_TABLET | ORAL | Status: DC | PRN

## 2015-07-04 NOTE — Discharge Instructions (Signed)

## 2015-07-04 NOTE — ED Provider Notes (Signed)
[facility] Emergency Department Provider Note  ____________________________________________  Time seen: Approximately 5:58 PM  I have reviewed the triage vital signs and the nursing notes.   HISTORY  Chief Complaint Fall    HPI Anita Wilson is a 69 y.o. female presents for evaluation of right femur pain. Patient states that she slipped earlier today landing on her butt and leg. Complains of increased pain and unable to bear weight on her leg.Pain is worse with weightbearing and minimalised with rest. Rates pain as 10 over 10.   Past Medical History  Diagnosis Date  . Breast cancer ([place])   . Hypertension   . Diabetes mellitus without complication ([place])   . Allergy   . Depression   . Emphysema of lung ([place])     There are no active problems to display for this patient.   History reviewed. No pertinent past surgical history.  Current Outpatient Rx  Name  Route  Sig  Dispense  Refill  . alendronate (FOSAMAX) 70 MG tablet   Oral   Take 70 mg by mouth.         Marland Kitchen aspirin EC 81 MG tablet   Oral   Take 81 mg by mouth.         Marland Kitchen atorvastatin (LIPITOR) 40 MG tablet   Oral   Take 40 mg by mouth.         . carvedilol (COREG) 25 MG tablet   Oral   Take 25 mg by mouth.         . citalopram (CELEXA) 20 MG tablet   Oral   Take 20 mg by mouth.         . EPINEPHrine (EPIPEN JR) 0.15 MG/0.3ML injection   Subcutaneous   Inject 0.15 mg into the skin.         . ferrous sulfate 325 (65 FE) MG EC tablet   Oral   Take 325 mg by mouth.         . fluticasone (FLONASE) 50 MCG/ACT nasal spray      Use 2 sprays in each  nostril daily         . glipiZIDE (GLUCOTROL) 10 MG tablet   Oral   Take 10 mg by mouth.         Marland Kitchen glucose blood (ONE TOUCH ULTRA TEST) test strip               . hydrALAZINE (APRESOLINE) 10 MG tablet   Oral   Take 10 mg by mouth.         . insulin glargine (LANTUS) 100 UNIT/ML injection    Subcutaneous   Inject into the skin.         . Insulin Syringe-Needle U-100 (INSULIN SYRINGE .3CC/31GX5/16") 31G X 5/16" 0.3 ML MISC               . isosorbide mononitrate (IMDUR) 30 MG 24 hr tablet   Oral   Take 30 mg by mouth.         . Multiple Vitamin (MULTI-VITAMINS) TABS   Oral   Take by mouth.         Marland Kitchen omeprazole (PRILOSEC) 20 MG capsule   Oral   Take 20 mg by mouth.         . oxyCODONE-acetaminophen (ROXICET) 5-325 MG tablet   Oral   Take 1-2 tablets by mouth every 4 (four) hours as needed for severe pain.   15 tablet   0   .  tiotropium (SPIRIVA HANDIHALER) 18 MCG inhalation capsule   Inhalation   Place into inhaler and inhale.           Allergies Bee venom; Diphenhydramine; Morphine; Phenylalanine; and Sulfa antibiotics  Family History  Problem Relation Age of Onset  . Family history unknown: Yes    Social History Social History  Substance Use Topics  . Smoking status: Current Some Day Smoker  . Smokeless tobacco: Never Used  . Alcohol Use: Yes     Comment: Occassionally    Review of Systems Constitutional: No fever/chills Eyes: No visual changes. ENT: No sore throat. Cardiovascular: Denies chest pain. Respiratory: Denies shortness of breath. Gastrointestinal: No abdominal pain.  No nausea, no vomiting.  No diarrhea.  No constipation. Genitourinary: Negative for dysuria. Musculoskeletal: Negative for back pain. Skin: Negative for rash. Neurological: Negative for headaches, focal weakness or numbness.  10-point ROS otherwise negative.  ____________________________________________   PHYSICAL EXAM:  VITAL SIGNS: ED Triage Vitals  Enc Vitals Group     BP 07/04/15 1656 161/69 mmHg     Pulse Rate 07/04/15 1656 81     Resp 07/04/15 1656 20     Temp 07/04/15 1656 97.7 F (36.5 C)     Temp Source 07/04/15 1656 Oral     SpO2 07/04/15 1656 96 %     Weight 07/04/15 1656 117 lb (53.071 kg)     Height 07/04/15 1656 5\' 4"  (1.626 m)      Head Cir --      Peak Flow --      Pain Score 07/04/15 1656 10     Pain Loc --      Pain Edu? --      Excl. in [place]? --     Constitutional: Alert and oriented. Well appearing and in no acute distress. Eyes: Conjunctivae are normal. PERRL. EOMI. Head: Atraumatic. Nose: No congestion/rhinnorhea. Mouth/Throat: Mucous membranes are moist.  Oropharynx non-erythematous. Neck: No stridor.   Cardiovascular: Normal rate, regular rhythm. Grossly normal heart sounds.  Good peripheral circulation. Respiratory: Normal respiratory effort.  No retractions. Lungs CTAB. Gastrointestinal: Soft and nontender. No distention. No abdominal bruits. No CVA tenderness. Musculoskeletal: Positive right femur pain distal neurovascularly intact. Full range of motion of the foot and toes. Tenderness noted to mid femur. Neurologic:  Normal speech and language. No gross focal neurologic deficits are appreciated. No gait instability. Skin:  Skin is warm, dry and intact. No rash noted. Psychiatric: Mood and affect are normal. Speech and behavior are normal.  ____________________________________________   LABS (all labs ordered are listed, but only abnormal results are displayed)  Labs Reviewed - No data to display ____________________________________________  RADIOLOGY  Negative femur x-ray for fracture. ____________________________________________   PROCEDURES  Procedure(s) performed: None  Critical Care performed: No  ____________________________________________   INITIAL IMPRESSION / ASSESSMENT AND PLAN / ED COURSE  Pertinent labs & imaging results that were available during my care of the patient were reviewed by me and considered in my medical decision making (see chart for details).  Status post fall with acute femur pain. Rx given for  Percocet 5/325. Patient to follow-up with orthopedics or return to the ER with any worsening symptomology. Patient voices no other emergency medical  complaints at this time. ____________________________________________   FINAL CLINICAL IMPRESSION(S) / ED DIAGNOSES  Final diagnoses:  Contusion of leg, right, initial encounter      Arlyss Repress, PA-C 07/04/15 [place], MD 07/05/15 [phone]

## 2015-07-04 NOTE — ED Notes (Signed)
Patient with no complaints at this time. Respirations even and unlabored. Skin warm/dry. Discharge instructions reviewed with patient at this time. Patient given opportunity to voice concerns/ask questions. Patient discharged at this time and left Emergency Department, via wheelchair.   

## 2015-07-04 NOTE — ED Notes (Signed)
Pt to ed with c/o fall today,  Pt states she slipped out on street in front of her house.  Pt states she landed on right buttock and right leg and has had pain since. Denies hitting her head, denies loss of consciousness. Pt states icnreased pain when she tries to bear weight on the leg.

## 2015-07-09 ENCOUNTER — Inpatient Hospital Stay
Admission: EM | Admit: 2015-07-09 | Discharge: 2015-07-13 | DRG: 480 | Disposition: A | Discharge status: Skilled Nursing Facility | Payer: Medicare Other | Attending: Internal Medicine | Admitting: Internal Medicine

## 2015-07-09 ENCOUNTER — Emergency Department: Payer: Medicare Other

## 2015-07-09 DIAGNOSIS — I480 Paroxysmal atrial fibrillation: Secondary | ICD-10-CM | POA: Diagnosis present

## 2015-07-09 DIAGNOSIS — S72001A Fracture of unspecified part of neck of right femur, initial encounter for closed fracture: Principal | ICD-10-CM | POA: Diagnosis present

## 2015-07-09 DIAGNOSIS — Z79891 Long term (current) use of opiate analgesic: Secondary | ICD-10-CM

## 2015-07-09 DIAGNOSIS — K219 Gastro-esophageal reflux disease without esophagitis: Secondary | ICD-10-CM | POA: Diagnosis present

## 2015-07-09 DIAGNOSIS — Z853 Personal history of malignant neoplasm of breast: Secondary | ICD-10-CM

## 2015-07-09 DIAGNOSIS — Z951 Presence of aortocoronary bypass graft: Secondary | ICD-10-CM

## 2015-07-09 DIAGNOSIS — W010XXA Fall on same level from slipping, tripping and stumbling without subsequent striking against object, initial encounter: Secondary | ICD-10-CM | POA: Diagnosis present

## 2015-07-09 DIAGNOSIS — Z91030 Bee allergy status: Secondary | ICD-10-CM

## 2015-07-09 DIAGNOSIS — Z79899 Other long term (current) drug therapy: Secondary | ICD-10-CM

## 2015-07-09 DIAGNOSIS — F17200 Nicotine dependence, unspecified, uncomplicated: Secondary | ICD-10-CM | POA: Diagnosis present

## 2015-07-09 DIAGNOSIS — N183 Chronic kidney disease, stage 3 unspecified: Secondary | ICD-10-CM | POA: Diagnosis present

## 2015-07-09 DIAGNOSIS — E119 Type 2 diabetes mellitus without complications: Secondary | ICD-10-CM | POA: Diagnosis present

## 2015-07-09 DIAGNOSIS — J449 Chronic obstructive pulmonary disease, unspecified: Secondary | ICD-10-CM | POA: Diagnosis present

## 2015-07-09 DIAGNOSIS — I5032 Chronic diastolic (congestive) heart failure: Secondary | ICD-10-CM | POA: Diagnosis present

## 2015-07-09 DIAGNOSIS — I158 Other secondary hypertension: Secondary | ICD-10-CM | POA: Diagnosis present

## 2015-07-09 DIAGNOSIS — Z888 Allergy status to other drugs, medicaments and biological substances status: Secondary | ICD-10-CM

## 2015-07-09 DIAGNOSIS — Z419 Encounter for procedure for purposes other than remedying health state, unspecified: Secondary | ICD-10-CM

## 2015-07-09 DIAGNOSIS — I2510 Atherosclerotic heart disease of native coronary artery without angina pectoris: Secondary | ICD-10-CM | POA: Diagnosis present

## 2015-07-09 DIAGNOSIS — N170 Acute kidney failure with tubular necrosis: Secondary | ICD-10-CM | POA: Diagnosis not present

## 2015-07-09 DIAGNOSIS — I10 Essential (primary) hypertension: Secondary | ICD-10-CM | POA: Diagnosis present

## 2015-07-09 DIAGNOSIS — R410 Disorientation, unspecified: Secondary | ICD-10-CM | POA: Diagnosis not present

## 2015-07-09 DIAGNOSIS — Z882 Allergy status to sulfonamides status: Secondary | ICD-10-CM

## 2015-07-09 DIAGNOSIS — I130 Hypertensive heart and chronic kidney disease with heart failure and stage 1 through stage 4 chronic kidney disease, or unspecified chronic kidney disease: Secondary | ICD-10-CM | POA: Diagnosis present

## 2015-07-09 DIAGNOSIS — I4891 Unspecified atrial fibrillation: Secondary | ICD-10-CM | POA: Diagnosis present

## 2015-07-09 DIAGNOSIS — Y92009 Unspecified place in unspecified non-institutional (private) residence as the place of occurrence of the external cause: Secondary | ICD-10-CM

## 2015-07-09 DIAGNOSIS — Z885 Allergy status to narcotic agent status: Secondary | ICD-10-CM

## 2015-07-09 DIAGNOSIS — Z794 Long term (current) use of insulin: Secondary | ICD-10-CM

## 2015-07-09 HISTORY — DX: Chronic diastolic (congestive) heart failure: I50.32

## 2015-07-09 HISTORY — DX: Chronic obstructive pulmonary disease, unspecified: J44.9

## 2015-07-09 HISTORY — DX: Chronic kidney disease, stage 3 unspecified: N18.30

## 2015-07-09 HISTORY — DX: Pulmonary hypertension, unspecified: I27.20

## 2015-07-09 HISTORY — DX: Atherosclerotic heart disease of native coronary artery without angina pectoris: I25.10

## 2015-07-09 HISTORY — DX: Chronic kidney disease, stage 3 (moderate): N18.3

## 2015-07-09 HISTORY — DX: Unspecified atrial fibrillation: I48.91

## 2015-07-09 MED ORDER — OXYCODONE-ACETAMINOPHEN 5-325 MG PO TABS
1.0000 | ORAL_TABLET | Freq: Once | ORAL | Status: AC
  Administered 2015-07-09: 1 via ORAL
  Filled 2015-07-09: qty 1

## 2015-07-09 NOTE — ED Provider Notes (Signed)
[facility] Emergency Department Provider Note  ____________________________________________  Time seen: Approximately 10:35 PM  I have reviewed the triage vital signs and the nursing notes.   HISTORY  Chief Complaint Leg Pain    HPI Anita Wilson is a 69 y.o. female who presents to the emergency department complaining of right-sided hip pain. The patient was seen in this department on 07/05/2015 status post a fall with a complaint of right hip pain. X-rays at that time revealed no acute bony abnormality. The patient has been taking pain medications as prescribed as well as using a walker for ambulation. She reports this evening she was reaching into her refrigerator, her right foot slipped, and she fell on right hip/knee again. The patient is complaining of increased right anterior hip pain. She states pain at original injury was less than current pain. Pain is sharp. Constant, worse with movement and weightbearing. She endorses pain being in the crease of her right groin. She denies hitting her head or losing consciousness. She denies any other injury.   Past Medical History  Diagnosis Date  . Breast cancer ([place])   . Hypertension   . Diabetes mellitus without complication ([place])   . Allergy   . Depression   . Emphysema of lung ([place])     There are no active problems to display for this patient.   No past surgical history on file.  Current Outpatient Rx  Name  Route  Sig  Dispense  Refill  . alendronate (FOSAMAX) 70 MG tablet   Oral   Take 70 mg by mouth.         Marland Kitchen aspirin EC 81 MG tablet   Oral   Take 81 mg by mouth.         Marland Kitchen atorvastatin (LIPITOR) 40 MG tablet   Oral   Take 40 mg by mouth.         . carvedilol (COREG) 25 MG tablet   Oral   Take 25 mg by mouth.         . citalopram (CELEXA) 20 MG tablet   Oral   Take 20 mg by mouth.         . EPINEPHrine (EPIPEN JR) 0.15 MG/0.3ML injection   Subcutaneous   Inject 0.15  mg into the skin.         . ferrous sulfate 325 (65 FE) MG EC tablet   Oral   Take 325 mg by mouth.         . fluticasone (FLONASE) 50 MCG/ACT nasal spray      Use 2 sprays in each  nostril daily         . glipiZIDE (GLUCOTROL) 10 MG tablet   Oral   Take 10 mg by mouth.         Marland Kitchen glucose blood (ONE TOUCH ULTRA TEST) test strip               . hydrALAZINE (APRESOLINE) 10 MG tablet   Oral   Take 10 mg by mouth.         . insulin glargine (LANTUS) 100 UNIT/ML injection   Subcutaneous   Inject into the skin.         . Insulin Syringe-Needle U-100 (INSULIN SYRINGE .3CC/31GX5/16") 31G X 5/16" 0.3 ML MISC               . isosorbide mononitrate (IMDUR) 30 MG 24 hr tablet   Oral   Take 30 mg by mouth.         Marland Kitchen  Multiple Vitamin (MULTI-VITAMINS) TABS   Oral   Take by mouth.         Marland Kitchen omeprazole (PRILOSEC) 20 MG capsule   Oral   Take 20 mg by mouth.         . oxyCODONE-acetaminophen (ROXICET) 5-325 MG tablet   Oral   Take 1-2 tablets by mouth every 4 (four) hours as needed for severe pain.   15 tablet   0   . tiotropium (SPIRIVA HANDIHALER) 18 MCG inhalation capsule   Inhalation   Place into inhaler and inhale.           Allergies Bee venom; Diphenhydramine; Morphine; Phenylalanine; and Sulfa antibiotics  Family History  Problem Relation Age of Onset  . Family history unknown: Yes    Social History Social History  Substance Use Topics  . Smoking status: Current Some Day Smoker  . Smokeless tobacco: Never Used  . Alcohol Use: Yes     Comment: Occassionally    Review of Systems Constitutional: No fever/chills Eyes: No visual changes. ENT: No sore throat. Cardiovascular: Denies chest pain. Respiratory: Denies shortness of breath. Gastrointestinal: No abdominal pain.  No nausea, no vomiting.  No diarrhea.  No constipation. Genitourinary: Negative for dysuria. Musculoskeletal: Negative for back pain. Endorses right hip  pain. Skin: Negative for rash. Neurological: Negative for headaches, focal weakness or numbness.  10-point ROS otherwise negative.  ____________________________________________   PHYSICAL EXAM:  VITAL SIGNS: ED Triage Vitals  Enc Vitals Group     BP 07/09/15 2150 149/69 mmHg     Pulse Rate 07/09/15 2150 76     Resp 07/09/15 2150 18     Temp 07/09/15 2150 97.9 F (36.6 C)     Temp Source 07/09/15 2150 Oral     SpO2 07/09/15 2150 99 %     Weight 07/09/15 2150 117 lb (53.071 kg)     Height 07/09/15 2150 5\' 4"  (1.626 m)     Head Cir --      Peak Flow --      Pain Score 07/09/15 2150 10     Pain Loc --      Pain Edu? --      Excl. in [place]? --     Constitutional: Alert and oriented. Well appearing and in no acute distress. Eyes: Conjunctivae are normal. PERRL. EOMI. Head: Atraumatic. Nose: No congestion/rhinnorhea. Mouth/Throat: Mucous membranes are moist.  Oropharynx non-erythematous. Neck: No stridor.  No cervical spine tenderness to palpation. Cardiovascular: Normal rate, regular rhythm. Grossly normal heart sounds.  Good peripheral circulation. Respiratory: Normal respiratory effort.  No retractions. Lungs CTAB. Gastrointestinal: Soft and nontender. No distention. No abdominal bruits. No CVA tenderness. Musculoskeletal: No visible deformity to right hip. Diffuse tenderness to palpation lateral hip. Sharp tenderness to palpation over anterior pubic bone/inguinal region. Limited range of motion due to pain. Leg is not shortened or rotated. Pulses and sensation intact distally. Examination of knee reveals no acute findings. Patient is nontender to palpation over spinal processes lumbar region.  No joint effusions. Neurologic:  Normal speech and language. No gross focal neurologic deficits are appreciated. No gait instability. Skin:  Skin is warm, dry and intact. No rash noted. Psychiatric: Mood and affect are normal. Speech and behavior are  normal.  ____________________________________________   LABS (all labs ordered are listed, but only abnormal results are displayed)  Labs Reviewed - No data to display ____________________________________________  EKG   ____________________________________________  RADIOLOGY  Right hip x-ray Impression: Apparent minimally displacedtranscervical fracture of  the right femoral neck. ____________________________________________   PROCEDURES  Procedure(s) performed: None  Critical Care performed: No  ____________________________________________   INITIAL IMPRESSION / ASSESSMENT AND PLAN / ED COURSE  Pertinent labs & imaging results that were available during my care of the patient were reviewed by me and considered in my medical decision making (see chart for details).  The patient's history, symptoms, physical exam, and radiological findings were taken and consideration for diagnosis. X-ray returned with minimally displaced transcervical fracture of the right femoral neck.  The on call orthopedic surgeon, Dr. Marry Guan, was paged. I discussed patient's history, symptoms, physical exam, and radiological results with him. Dr. Marry Guan advised he would place orthopedic orders for patient and see her in the morning. The patient is to be admitted.  Patient's course was discussed with Dr. Archie Balboa as well as hospitalist. Patient is to be transferred from flex to major side pending admission.   ____________________________________________   FINAL CLINICAL IMPRESSION(S) / ED DIAGNOSES  Final diagnoses:  Femoral neck fracture, right, closed, initial encounter      Darletta Moll, PA-C 07/10/15 0005  Nance Pear, MD 07/10/15 [phone]

## 2015-07-09 NOTE — ED Notes (Signed)
Pt to triage via w/c with no distress noted; pt st fell last Thursday with subsequent right thigh pain; st xray WNL but pain persists and has been using a walker; st PTA fell in kitchen while using walker and having increased pain

## 2015-07-10 ENCOUNTER — Inpatient Hospital Stay: Payer: Medicare Other | Admitting: Anesthesiology

## 2015-07-10 ENCOUNTER — Inpatient Hospital Stay: Payer: Medicare Other

## 2015-07-10 ENCOUNTER — Encounter: Payer: Self-pay | Admitting: Orthopedic Surgery

## 2015-07-10 ENCOUNTER — Encounter
Admission: EM | Disposition: A | Discharge status: Skilled Nursing Facility | Payer: Self-pay | Source: Home / Self Care | Attending: Internal Medicine

## 2015-07-10 ENCOUNTER — Other Ambulatory Visit: Payer: Self-pay | Admitting: Emergency Medicine

## 2015-07-10 DIAGNOSIS — I480 Paroxysmal atrial fibrillation: Secondary | ICD-10-CM | POA: Diagnosis present

## 2015-07-10 DIAGNOSIS — I2510 Atherosclerotic heart disease of native coronary artery without angina pectoris: Secondary | ICD-10-CM | POA: Diagnosis present

## 2015-07-10 DIAGNOSIS — I5032 Chronic diastolic (congestive) heart failure: Secondary | ICD-10-CM | POA: Diagnosis present

## 2015-07-10 DIAGNOSIS — J449 Chronic obstructive pulmonary disease, unspecified: Secondary | ICD-10-CM | POA: Diagnosis present

## 2015-07-10 DIAGNOSIS — N170 Acute kidney failure with tubular necrosis: Secondary | ICD-10-CM | POA: Diagnosis not present

## 2015-07-10 DIAGNOSIS — Z853 Personal history of malignant neoplasm of breast: Secondary | ICD-10-CM | POA: Diagnosis not present

## 2015-07-10 DIAGNOSIS — K219 Gastro-esophageal reflux disease without esophagitis: Secondary | ICD-10-CM | POA: Diagnosis present

## 2015-07-10 DIAGNOSIS — S72001A Fracture of unspecified part of neck of right femur, initial encounter for closed fracture: Secondary | ICD-10-CM | POA: Diagnosis present

## 2015-07-10 DIAGNOSIS — Z885 Allergy status to narcotic agent status: Secondary | ICD-10-CM | POA: Diagnosis not present

## 2015-07-10 DIAGNOSIS — I4891 Unspecified atrial fibrillation: Secondary | ICD-10-CM

## 2015-07-10 DIAGNOSIS — Z794 Long term (current) use of insulin: Secondary | ICD-10-CM | POA: Diagnosis not present

## 2015-07-10 DIAGNOSIS — Z882 Allergy status to sulfonamides status: Secondary | ICD-10-CM | POA: Diagnosis not present

## 2015-07-10 DIAGNOSIS — I158 Other secondary hypertension: Secondary | ICD-10-CM | POA: Diagnosis present

## 2015-07-10 DIAGNOSIS — N183 Chronic kidney disease, stage 3 unspecified: Secondary | ICD-10-CM | POA: Diagnosis present

## 2015-07-10 DIAGNOSIS — I130 Hypertensive heart and chronic kidney disease with heart failure and stage 1 through stage 4 chronic kidney disease, or unspecified chronic kidney disease: Secondary | ICD-10-CM | POA: Diagnosis present

## 2015-07-10 DIAGNOSIS — Y92009 Unspecified place in unspecified non-institutional (private) residence as the place of occurrence of the external cause: Secondary | ICD-10-CM | POA: Diagnosis not present

## 2015-07-10 DIAGNOSIS — F17200 Nicotine dependence, unspecified, uncomplicated: Secondary | ICD-10-CM | POA: Diagnosis present

## 2015-07-10 DIAGNOSIS — Z79899 Other long term (current) drug therapy: Secondary | ICD-10-CM | POA: Diagnosis not present

## 2015-07-10 DIAGNOSIS — E119 Type 2 diabetes mellitus without complications: Secondary | ICD-10-CM

## 2015-07-10 DIAGNOSIS — Z888 Allergy status to other drugs, medicaments and biological substances status: Secondary | ICD-10-CM | POA: Diagnosis not present

## 2015-07-10 DIAGNOSIS — R410 Disorientation, unspecified: Secondary | ICD-10-CM | POA: Diagnosis not present

## 2015-07-10 DIAGNOSIS — Z951 Presence of aortocoronary bypass graft: Secondary | ICD-10-CM | POA: Diagnosis not present

## 2015-07-10 DIAGNOSIS — W010XXA Fall on same level from slipping, tripping and stumbling without subsequent striking against object, initial encounter: Secondary | ICD-10-CM | POA: Diagnosis present

## 2015-07-10 DIAGNOSIS — I10 Essential (primary) hypertension: Secondary | ICD-10-CM | POA: Diagnosis present

## 2015-07-10 DIAGNOSIS — Z79891 Long term (current) use of opiate analgesic: Secondary | ICD-10-CM | POA: Diagnosis not present

## 2015-07-10 DIAGNOSIS — Z91030 Bee allergy status: Secondary | ICD-10-CM | POA: Diagnosis not present

## 2015-07-10 HISTORY — PX: HIP PINNING,CANNULATED: SHX1758

## 2015-07-10 LAB — BASIC METABOLIC PANEL
ANION GAP: 4 — AB (ref 5–15)
BUN: 45 mg/dL — ABNORMAL HIGH (ref 6–20)
CALCIUM: 8.5 mg/dL — AB (ref 8.9–10.3)
CO2: 24 mmol/L (ref 22–32)
CREATININE: 1.52 mg/dL — AB (ref 0.44–1.00)
Chloride: 110 mmol/L (ref 101–111)
GFR, EST AFRICAN AMERICAN: 40 mL/min — AB (ref >60)
GFR, EST NON AFRICAN AMERICAN: 34 mL/min — AB (ref >60)
Glucose, Bld: 163 mg/dL — ABNORMAL HIGH (ref 65–99)
Potassium: 4.7 mmol/L (ref 3.5–5.1)
SODIUM: 138 mmol/L (ref 135–145)

## 2015-07-10 LAB — CBC
HCT: 33 % — ABNORMAL LOW (ref 35.0–47.0)
HEMOGLOBIN: 11.1 g/dL — AB (ref 12.0–16.0)
MCH: 31.9 pg (ref 26.0–34.0)
MCHC: 33.6 g/dL (ref 32.0–36.0)
MCV: 94.8 fL (ref 80.0–100.0)
PLATELETS: 128 10*3/uL — AB (ref 150–440)
RBC: 3.48 MIL/uL — AB (ref 3.80–5.20)
RDW: 13.3 % (ref 11.5–14.5)
WBC: 6.2 10*3/uL (ref 3.6–11.0)

## 2015-07-10 LAB — URINALYSIS COMPLETE WITH MICROSCOPIC (ARMC ONLY)
Bilirubin Urine: NEGATIVE
Glucose, UA: NEGATIVE mg/dL
Ketones, ur: NEGATIVE mg/dL
NITRITE: POSITIVE — AB
PH: 5 (ref 5.0–8.0)
PROTEIN: 100 mg/dL — AB
SPECIFIC GRAVITY, URINE: 1.012 (ref 1.005–1.030)
SQUAMOUS EPITHELIAL / LPF: NONE SEEN

## 2015-07-10 LAB — PROTIME-INR
INR: 1.16
Prothrombin Time: 15 seconds (ref 11.4–15.0)

## 2015-07-10 LAB — APTT: aPTT: 38 seconds — ABNORMAL HIGH (ref 24–36)

## 2015-07-10 LAB — TYPE AND SCREEN
ABO/RH(D): A POS
Antibody Screen: NEGATIVE

## 2015-07-10 LAB — GLUCOSE, CAPILLARY
GLUCOSE-CAPILLARY: 81 mg/dL (ref 65–99)
GLUCOSE-CAPILLARY: 111 mg/dL — AB (ref 65–99)
Glucose-Capillary: 99 mg/dL (ref 65–99)

## 2015-07-10 LAB — ABO/RH: ABO/RH(D): A POS

## 2015-07-10 LAB — HEMOGLOBIN A1C: HEMOGLOBIN A1C: 5.9 % (ref 4.0–6.0)

## 2015-07-10 LAB — MRSA PCR SCREENING: MRSA by PCR: NEGATIVE

## 2015-07-10 SURGERY — FIXATION, FEMUR, NECK, PERCUTANEOUS, USING SCREW
Anesthesia: General | Site: Hip | Laterality: Right | Wound class: Clean

## 2015-07-10 MED ORDER — METOCLOPRAMIDE HCL 5 MG/ML IJ SOLN: 5.0000 mg | Freq: Three times a day (TID) | INTRAMUSCULAR | Status: DC | PRN

## 2015-07-10 MED ORDER — ENOXAPARIN SODIUM 40 MG/0.4ML ~~LOC~~ SOLN
40.0000 mg | SUBCUTANEOUS | Status: DC
  Administered 2015-07-11 – 2015-07-13 (×2): 40 mg via SUBCUTANEOUS
  Filled 2015-07-10 (×2): qty 0.4

## 2015-07-10 MED ORDER — SODIUM CHLORIDE 0.9 % IJ SOLN
3.0000 mL | Freq: Two times a day (BID) | INTRAMUSCULAR | Status: DC
  Administered 2015-07-10: 3 mL via INTRAVENOUS

## 2015-07-10 MED ORDER — ACETAMINOPHEN 650 MG RE SUPP: 650.0000 mg | Freq: Four times a day (QID) | RECTAL | Status: DC | PRN

## 2015-07-10 MED ORDER — FLEET ENEMA 7-19 GM/118ML RE ENEM: 1.0000 | ENEMA | Freq: Once | RECTAL | Status: DC | PRN

## 2015-07-10 MED ORDER — HYDROMORPHONE HCL 1 MG/ML IJ SOLN
0.5000 mg | INTRAMUSCULAR | Status: DC | PRN
  Administered 2015-07-10: 0.5 mg via INTRAVENOUS
  Filled 2015-07-10: qty 1

## 2015-07-10 MED ORDER — MIDAZOLAM HCL 5 MG/5ML IJ SOLN
INTRAMUSCULAR | Status: DC | PRN
  Administered 2015-07-10: 2 mg via INTRAVENOUS

## 2015-07-10 MED ORDER — BISACODYL 10 MG RE SUPP: 10.0000 mg | Freq: Every day | RECTAL | Status: DC | PRN

## 2015-07-10 MED ORDER — ONDANSETRON HCL 4 MG/2ML IJ SOLN
INTRAMUSCULAR | Status: DC | PRN
  Administered 2015-07-10: 4 mg via INTRAVENOUS

## 2015-07-10 MED ORDER — PANTOPRAZOLE SODIUM 40 MG PO TBEC
40.0000 mg | DELAYED_RELEASE_TABLET | Freq: Two times a day (BID) | ORAL | Status: DC
Start: 2015-07-10 — End: 2015-07-13
  Administered 2015-07-10 – 2015-07-13 (×6): 40 mg via ORAL
  Filled 2015-07-10 (×6): qty 1

## 2015-07-10 MED ORDER — SODIUM CHLORIDE 0.9 % IV SOLN
INTRAVENOUS | Status: DC
  Administered 2015-07-10 (×3): via INTRAVENOUS

## 2015-07-10 MED ORDER — MAGNESIUM HYDROXIDE 400 MG/5ML PO SUSP
30.0000 mL | Freq: Every day | ORAL | Status: DC | PRN
  Administered 2015-07-11 – 2015-07-12 (×2): 30 mL via ORAL
  Filled 2015-07-10: qty 30

## 2015-07-10 MED ORDER — CITALOPRAM HYDROBROMIDE 20 MG PO TABS
10.0000 mg | ORAL_TABLET | Freq: Every day | ORAL | Status: DC
  Administered 2015-07-11 – 2015-07-13 (×3): 10 mg via ORAL
  Filled 2015-07-10 (×3): qty 1

## 2015-07-10 MED ORDER — FERROUS SULFATE 325 (65 FE) MG PO TABS
325.0000 mg | ORAL_TABLET | Freq: Three times a day (TID) | ORAL | Status: DC
  Administered 2015-07-11 – 2015-07-13 (×6): 325 mg via ORAL
  Filled 2015-07-10 (×8): qty 1

## 2015-07-10 MED ORDER — TRAMADOL HCL 50 MG PO TABS
50.0000 mg | ORAL_TABLET | ORAL | Status: DC | PRN
  Administered 2015-07-11 (×2): 100 mg via ORAL
  Filled 2015-07-10 (×2): qty 2

## 2015-07-10 MED ORDER — CEFAZOLIN SODIUM-DEXTROSE 2-3 GM-% IV SOLR
2.0000 g | INTRAVENOUS | Status: AC
  Administered 2015-07-10: 2 g via INTRAVENOUS
  Filled 2015-07-10: qty 50

## 2015-07-10 MED ORDER — ONDANSETRON HCL 4 MG/2ML IJ SOLN: 4.0000 mg | Freq: Once | INTRAMUSCULAR | Status: DC | PRN

## 2015-07-10 MED ORDER — INSULIN ASPART 100 UNIT/ML ~~LOC~~ SOLN
0.0000 [IU] | Freq: Four times a day (QID) | SUBCUTANEOUS | Status: DC
  Administered 2015-07-11 (×2): 2 [IU] via SUBCUTANEOUS
  Filled 2015-07-10: qty 2
  Filled 2015-07-10: qty 3
  Filled 2015-07-10: qty 2

## 2015-07-10 MED ORDER — NEOMYCIN-POLYMYXIN B GU 40-200000 IR SOLN
Status: DC | PRN
  Administered 2015-07-10: 2 mL

## 2015-07-10 MED ORDER — OXYCODONE HCL 5 MG PO TABS: 5.0000 mg | ORAL_TABLET | ORAL | Status: DC | PRN

## 2015-07-10 MED ORDER — ASPIRIN EC 81 MG PO TBEC
81.0000 mg | DELAYED_RELEASE_TABLET | Freq: Every day | ORAL | Status: DC
  Administered 2015-07-11 – 2015-07-13 (×3): 81 mg via ORAL
  Filled 2015-07-10 (×3): qty 1

## 2015-07-10 MED ORDER — CARVEDILOL 25 MG PO TABS
25.0000 mg | ORAL_TABLET | Freq: Two times a day (BID) | ORAL | Status: DC
  Administered 2015-07-10 – 2015-07-13 (×7): 25 mg via ORAL
  Filled 2015-07-10 (×7): qty 1

## 2015-07-10 MED ORDER — HYDROMORPHONE HCL 1 MG/ML IJ SOLN
0.5000 mg | INTRAMUSCULAR | Status: DC | PRN
  Administered 2015-07-10 (×5): 0.5 mg via INTRAVENOUS
  Filled 2015-07-10 (×3): qty 1

## 2015-07-10 MED ORDER — ONDANSETRON HCL 4 MG PO TABS: 4.0000 mg | ORAL_TABLET | Freq: Four times a day (QID) | ORAL | Status: DC | PRN

## 2015-07-10 MED ORDER — FENTANYL CITRATE (PF) 100 MCG/2ML IJ SOLN: 25.0000 ug | INTRAMUSCULAR | Status: DC | PRN

## 2015-07-10 MED ORDER — PROPOFOL 10 MG/ML IV BOLUS
INTRAVENOUS | Status: DC | PRN
  Administered 2015-07-10: 20 mg via INTRAVENOUS

## 2015-07-10 MED ORDER — OXYCODONE HCL 5 MG PO TABS
5.0000 mg | ORAL_TABLET | ORAL | Status: DC | PRN
  Administered 2015-07-10: 10 mg via ORAL
  Administered 2015-07-11: 5 mg via ORAL
  Administered 2015-07-11 – 2015-07-13 (×5): 10 mg via ORAL
  Filled 2015-07-10: qty 1
  Filled 2015-07-10 (×7): qty 2

## 2015-07-10 MED ORDER — PROPOFOL 500 MG/50ML IV EMUL
INTRAVENOUS | Status: DC | PRN
  Administered 2015-07-10: 50 ug/kg/min via INTRAVENOUS

## 2015-07-10 MED ORDER — METOCLOPRAMIDE HCL 5 MG PO TABS: 5.0000 mg | ORAL_TABLET | Freq: Three times a day (TID) | ORAL | Status: DC | PRN

## 2015-07-10 MED ORDER — ONDANSETRON HCL 4 MG/2ML IJ SOLN: 4.0000 mg | Freq: Four times a day (QID) | INTRAMUSCULAR | Status: DC | PRN

## 2015-07-10 MED ORDER — DEXTROSE 5 % IV SOLN
1.0000 g | INTRAVENOUS | Status: DC
  Administered 2015-07-10 – 2015-07-12 (×3): 1 g via INTRAVENOUS
  Filled 2015-07-10 (×3): qty 10

## 2015-07-10 MED ORDER — KCL IN DEXTROSE-NACL 20-5-0.9 MEQ/L-%-% IV SOLN
INTRAVENOUS | Status: DC
  Administered 2015-07-10 – 2015-07-11 (×2): via INTRAVENOUS
  Filled 2015-07-10 (×5): qty 1000

## 2015-07-10 MED ORDER — TIOTROPIUM BROMIDE MONOHYDRATE 18 MCG IN CAPS
18.0000 ug | ORAL_CAPSULE | Freq: Every day | RESPIRATORY_TRACT | Status: DC
  Administered 2015-07-10 – 2015-07-12 (×3): 18 ug via RESPIRATORY_TRACT
  Filled 2015-07-10: qty 5

## 2015-07-10 MED ORDER — ACETAMINOPHEN 325 MG PO TABS: 650.0000 mg | ORAL_TABLET | Freq: Four times a day (QID) | ORAL | Status: DC | PRN

## 2015-07-10 MED ORDER — SENNOSIDES-DOCUSATE SODIUM 8.6-50 MG PO TABS
1.0000 | ORAL_TABLET | Freq: Two times a day (BID) | ORAL | Status: DC
  Administered 2015-07-10 – 2015-07-13 (×6): 1 via ORAL
  Filled 2015-07-10 (×6): qty 1

## 2015-07-10 MED ORDER — HYDROMORPHONE HCL 1 MG/ML IJ SOLN
INTRAMUSCULAR | Status: AC
  Administered 2015-07-10: 0.5 mg via INTRAVENOUS
  Filled 2015-07-10: qty 1

## 2015-07-10 MED ORDER — CEFAZOLIN SODIUM-DEXTROSE 2-3 GM-% IV SOLR
2.0000 g | Freq: Four times a day (QID) | INTRAVENOUS | Status: AC
  Administered 2015-07-10 – 2015-07-11 (×3): 2 g via INTRAVENOUS
  Filled 2015-07-10 (×4): qty 50

## 2015-07-10 MED ORDER — TRAMADOL HCL 50 MG PO TABS: 50.0000 mg | ORAL_TABLET | ORAL | Status: DC | PRN

## 2015-07-10 MED ORDER — DOCUSATE SODIUM 100 MG PO CAPS
100.0000 mg | ORAL_CAPSULE | Freq: Two times a day (BID) | ORAL | Status: DC
  Administered 2015-07-10 – 2015-07-13 (×6): 100 mg via ORAL
  Filled 2015-07-10 (×6): qty 1

## 2015-07-10 MED ORDER — GLUCERNA SHAKE PO LIQD
237.0000 mL | Freq: Two times a day (BID) | ORAL | Status: DC
  Administered 2015-07-10 – 2015-07-13 (×2): 237 mL via ORAL

## 2015-07-10 MED ORDER — ISOSORBIDE MONONITRATE ER 30 MG PO TB24
30.0000 mg | ORAL_TABLET | Freq: Every day | ORAL | Status: DC
  Administered 2015-07-11 – 2015-07-13 (×3): 30 mg via ORAL
  Filled 2015-07-10 (×3): qty 1

## 2015-07-10 MED ORDER — ATORVASTATIN CALCIUM 20 MG PO TABS
40.0000 mg | ORAL_TABLET | Freq: Every day | ORAL | Status: DC
  Administered 2015-07-10 – 2015-07-12 (×3): 40 mg via ORAL
  Filled 2015-07-10: qty 2
  Filled 2015-07-10: qty 1
  Filled 2015-07-10 (×2): qty 2

## 2015-07-10 SURGICAL SUPPLY — 28 items
BIT DRILL CANNULATED 5.0 (BIT) ×3 IMPLANT
CANISTER SUCT 1200ML W/VALVE (MISCELLANEOUS) ×3 IMPLANT
CHLORAPREP W/TINT 26ML (MISCELLANEOUS) ×3 IMPLANT
DRSG OPSITE POSTOP 4X6 (GAUZE/BANDAGES/DRESSINGS) ×3 IMPLANT
DRSG TEGADERM 6X8 (GAUZE/BANDAGES/DRESSINGS) IMPLANT
GAUZE PETRO XEROFOAM 1X8 (MISCELLANEOUS) ×3 IMPLANT
GAUZE SPONGE 4X4 12PLY STRL (GAUZE/BANDAGES/DRESSINGS) IMPLANT
GLOVE BIO SURGEON STRL SZ8 (GLOVE) ×6 IMPLANT
GLOVE INDICATOR 8.0 STRL GRN (GLOVE) ×3 IMPLANT
GOWN STRL REUS W/ TWL LRG LVL3 (GOWN DISPOSABLE) ×1 IMPLANT
GOWN STRL REUS W/ TWL XL LVL3 (GOWN DISPOSABLE) ×1 IMPLANT
GOWN STRL REUS W/TWL LRG LVL3 (GOWN DISPOSABLE) ×2
GOWN STRL REUS W/TWL XL LVL3 (GOWN DISPOSABLE) ×2
GUIDE PIN 3.2MM (PIN) ×2
GUIDE PIN ORTH 12X3.2X (PIN) ×1 IMPLANT
NEEDLE FILTER BLUNT 18X 1/2SAF (NEEDLE) ×2
NEEDLE FILTER BLUNT 18X1 1/2 (NEEDLE) ×1 IMPLANT
PACK HIP COMPR (MISCELLANEOUS) ×3 IMPLANT
PAD GROUND ADULT SPLIT (MISCELLANEOUS) ×3 IMPLANT
SCREW CANNULATED 7.5X80 (Screw) ×12 IMPLANT
SCREW CANNULATED 7.5X90 (Screw) ×9 IMPLANT
STAPLER SKIN PROX 35W (STAPLE) ×3 IMPLANT
STRAP SAFETY BODY (MISCELLANEOUS) ×3 IMPLANT
SUT PROLENE 2 0 FS (SUTURE) IMPLANT
SUT VIC AB 0 SH 27 (SUTURE) IMPLANT
SUT VIC AB 2-0 CT1 (SUTURE) ×3 IMPLANT
SYR 5ML LL (SYRINGE) ×3 IMPLANT
TAPE MICROFOAM 4IN (TAPE) IMPLANT

## 2015-07-10 NOTE — Op Note (Signed)
07/09/2015 - 07/10/2015  5:44 PM  Patient:   Anita Wilson  Pre-Op Diagnosis:   Valgus-impacted right femoral neck fracture.  Post-Op Diagnosis:   Same.  Procedure:   In situ cannulated screw fixation of valgus-impacted right femoral neck fracture.  Surgeon:   Pascal Lux, MD  Assistant:   None  Anesthesia:   Spinal  Findings:   As above.  Complications:   None  EBL:   35 cc  Fluids:   350 cc crystalloid  UOP:  300 cc  TT:   None  Drains:   None  Closure:   Staples  Implants:   Biomet 6.5 mm cannulated screws (16 mm) 3  Brief Clinical Note:   The patient is a 69 year old female who sustained the above-noted injury secondary to a fall last evening.  She apparently sustained a fall onto her right hip 6 days ago, but x-rays at that time were negative. She presented emergency room following a more recent fall where x-rays demonstrated the above-noted findings. She has been cleared medically and presents at this time for definitive management of her injury.  Procedure:   The patient was brought into the operating room. After adequate spinal anesthesia was obtained, the patient was lain in the supine position on the fracture table. Her uninjured leg was placed in a flexed and abducted position over the well-leg holder while the operative leg was placed in gentle longitudinal traction with some internal rotation. The adequacy of fracture position was verified fluoroscopically in AP and lateral projections before the lateral aspect of the right hip and thigh were prepped with ChloraPrep solution and draped sterilely. Preoperative antibiotics were administered. An approximately 3-4 cm incision was made over the lateral aspect of the lower part of the greater trochanter as verified fluoroscopically. This incision was carried down through the subcutaneous cutaneous tissues to expose the iliotibial band. This was split the length the incision to expose the lateral aspect of the  proximal femur at the level of the inferior most part of the greater trochanter. Under fluoroscopic guidance, a guide wire was drilled up through the femoral neck along the calcar into the femoral head to rest within 5 mm of subchondral bone. Its position was assessed fluoroscopically in AP and lateral projections and found to be excellent. Two additional guide wires were placed in parallel fashion more proximally, anterior and posterior to the original pin in an inverted triangular configuration. Again the position of these pins was verified fluoroscopically in AP, lateral, and oblique projections and found to be excellent. The length of each of these pins was measured before each pin was overdrilled with the appropriate cannulated drill. Each of these screws was inserted and advanced to within 5 mm of subchondral bone. Again the position of each of these pins was assessed fluoroscopically in AP, lateral, and oblique projections, and found to be excellent.   The wound was copiously irrigated with sterile saline solution before the IT band was reapproximated using 2-0 Vicryl interrupted sutures. The subcutaneous tissues also were closed using 2-0 Vicryl interrupted sutures before the skin was closed using staples. A sterile occlusive dressing was applied to the wound before the patient was transferred back to her hospital bed. The patient was then awakened and returned to the recovery room in satisfactory condition after tolerating the procedure well.

## 2015-07-10 NOTE — H&P (Signed)
[facility] NAME: Anita Wilson    MR#:  [id number]  DATE OF BIRTH:  05-28-1946  DATE OF ADMISSION:  07/09/2015  PRIMARY CARE PHYSICIAN: Alessandra Grout, MD   REQUESTING/REFERRING PHYSICIAN: Archie Balboa, M.D.  CHIEF COMPLAINT:   Chief Complaint  Patient presents with  . Leg Pain    HISTORY OF PRESENT ILLNESS:  Anita Wilson  is a 69 y.o. female who presents with right hip fracture. Patient states that she fell initially last Thursday, and came to the ED for evaluation but was told she did not fracture her hip. She then had another fall on the day of presentation to the ED a second time. This time x-ray does show right hip fracture. Orthopedics follow from the ED, will see in the morning. Hospitalists called for admission.  PAST MEDICAL HISTORY:   Past Medical History  Diagnosis Date  . Breast cancer ([place])   . Hypertension   . Diabetes mellitus without complication ([place])   . Allergy   . Depression   . COPD (chronic obstructive pulmonary disease) ([place])   . A-fib ([place])   . CAD (coronary artery disease)   . Chronic diastolic heart failure ([place])   . Pulmonary HTN ([place])   . CKD (chronic kidney disease), stage III     PAST SURGICAL HISTORY:   Past Surgical History  Procedure Laterality Date  . Coronary artery bypass graft    . Mastectomy    . Parathyroidectomy    . Appendectomy    . Breast lumpectomy with axillary lymph node dissection      SOCIAL HISTORY:   Social History  Substance Use Topics  . Smoking status: Current Some Day Smoker  . Smokeless tobacco: Never Used  . Alcohol Use: Yes     Comment: Occassionally    FAMILY HISTORY:   Family History  Problem Relation Age of Onset  . Hypertension Mother   . Diabetes Mother   . Heart disease Father   . Diabetes Father   . Diabetes Sister   . Breast cancer Sister   . Dementia Sister     DRUG ALLERGIES:   Allergies  Allergen Reactions   . Bee Venom Shortness Of Breath and Swelling  . Diphenhydramine     Other reaction(s): Other (See Comments) "took tylenol pm and ended up in hospital with something wrong with my heart"  . Morphine     Other reaction(s): Other (See Comments) hallucination  . Phenylalanine Diarrhea    "upset stomach"  . Sulfa Antibiotics     Other reaction(s): Other (See Comments) "metalic smell and taste"    MEDICATIONS AT HOME:   Prior to Admission medications   Medication Sig Start Date End Date Taking? Authorizing Provider  alendronate (FOSAMAX) 70 MG tablet Take 70 mg by mouth once a week.  10/04/14 10/04/15 Yes Historical Provider, MD  aspirin EC 81 MG tablet Take 81 mg by mouth daily.  06/06/12  Yes Historical Provider, MD  atorvastatin (LIPITOR) 40 MG tablet Take 40 mg by mouth daily at 6 PM.  09/21/14 09/21/15 Yes Historical Provider, MD  carvedilol (COREG) 25 MG tablet Take 25 mg by mouth 2 (two) times daily with a meal.  09/21/14 09/21/15 Yes Historical Provider, MD  citalopram (CELEXA) 10 MG tablet Take 10 mg by mouth daily.   Yes Historical Provider, MD  CLONIDINE HCL PO Take 1 tablet by mouth daily.   Yes Historical Provider, MD  EPINEPHrine [facility]  JR) 0.15 MG/0.3ML injection Inject 0.15 mg into the skin. 03/16/14  Yes Historical Provider, MD  ferrous sulfate 325 (65 FE) MG EC tablet Take 325 mg by mouth daily with breakfast.  09/09/11  Yes Historical Provider, MD  fluticasone (FLONASE) 50 MCG/ACT nasal spray Use 2 sprays in each  nostril daily 09/25/14  Yes Historical Provider, MD  glipiZIDE (GLUCOTROL) 10 MG tablet Take 10 mg by mouth 2 (two) times daily before a meal.  09/24/14  Yes Historical Provider, MD  glucose blood (ONE TOUCH ULTRA TEST) test strip  01/04/14  Yes Historical Provider, MD  hydrALAZINE (APRESOLINE) 10 MG tablet Take 10 mg by mouth 3 (three) times daily.  09/21/14 09/21/15 Yes Historical Provider, MD  insulin glargine (LANTUS) 100 UNIT/ML injection Inject 10-16 Units into the skin  at bedtime.  03/28/15  Yes Historical Provider, MD  Insulin Syringe-Needle U-100 (INSULIN SYRINGE .3CC/31GX5/16") 31G X 5/16" 0.3 ML MISC  05/04/14  Yes Historical Provider, MD  isosorbide mononitrate (IMDUR) 30 MG 24 hr tablet Take 30 mg by mouth daily.  09/24/14 09/24/15 Yes Historical Provider, MD  letrozole (FEMARA) 2.5 MG tablet Take 2.5 mg by mouth daily.   Yes Historical Provider, MD  Multiple Vitamin (MULTI-VITAMINS) TABS Take 1 tablet by mouth daily.  11/14/07  Yes Historical Provider, MD  omeprazole (PRILOSEC) 20 MG capsule Take 20 mg by mouth 2 (two) times daily before a meal.  09/27/14  Yes Historical Provider, MD  oxyCODONE-acetaminophen (ROXICET) 5-325 MG tablet Take 1-2 tablets by mouth every 4 (four) hours as needed for severe pain. 07/04/15  Yes Pierce Crane Beers, PA-C  tiotropium (SPIRIVA HANDIHALER) 18 MCG inhalation capsule Place into inhaler and inhale. 10/22/14 10/22/15 Yes Historical Provider, MD    REVIEW OF SYSTEMS:  Review of Systems  Constitutional: Negative for fever, chills, weight loss and malaise/fatigue.  HENT: Negative for ear pain, hearing loss and tinnitus.   Eyes: Negative for blurred vision, double vision, pain and redness.  Respiratory: Negative for cough, hemoptysis and shortness of breath.   Cardiovascular: Negative for chest pain, palpitations, orthopnea and leg swelling.  Gastrointestinal: Negative for nausea, vomiting, abdominal pain, diarrhea and constipation.  Genitourinary: Negative for dysuria, frequency and hematuria.  Musculoskeletal: Positive for joint pain (right hip) and falls. Negative for back pain and neck pain.  Skin:       No acne, rash, or lesions  Neurological: Negative for dizziness, tremors, focal weakness and weakness.  Endo/Heme/Allergies: Negative for polydipsia. Does not bruise/bleed easily.  Psychiatric/Behavioral: Negative for depression. The patient is not nervous/anxious and does not have insomnia.      VITAL SIGNS:   Filed Vitals:    07/09/15 2150 07/10/15 0058  BP: 149/69 142/57  Pulse: 76 73  Temp: 97.9 F (36.6 C)   TempSrc: Oral   Resp: 18 16  Height: 5\' 4"  (1.626 m)   Weight: 53.071 kg (117 lb)   SpO2: 99% 91%   Wt Readings from Last 3 Encounters:  07/09/15 53.071 kg (117 lb)  07/04/15 53.071 kg (117 lb)  04/15/15 54.3 kg (119 lb 11.4 oz)    PHYSICAL EXAMINATION:  Physical Exam  Vitals reviewed. Constitutional: She is oriented to person, place, and time. She appears well-developed and well-nourished. No distress.  HENT:  Head: Normocephalic and atraumatic.  Mouth/Throat: Oropharynx is clear and moist.  Eyes: Conjunctivae and EOM are normal. Pupils are equal, round, and reactive to light. No scleral icterus.  Neck: Normal range of motion. Neck supple. No JVD present.  No thyromegaly present.  Cardiovascular: Normal rate, regular rhythm and intact distal pulses.  Exam reveals no gallop and no friction rub.   No murmur heard. Respiratory: Effort normal and breath sounds normal. No respiratory distress. She has no wheezes. She has no rales.  GI: Soft. Bowel sounds are normal. She exhibits no distension. There is no tenderness.  Musculoskeletal: Normal range of motion. She exhibits tenderness (Right hip tenderness). She exhibits no edema.  No arthritis, no gout  Lymphadenopathy:    She has no cervical adenopathy.  Neurological: She is alert and oriented to person, place, and time. No cranial nerve deficit.  No dysarthria, no aphasia  Skin: Skin is warm and dry. No rash noted. No erythema.  Psychiatric: She has a normal mood and affect. Her behavior is normal. Judgment and thought content normal.    LABORATORY PANEL:   CBC  Recent Labs Lab 07/10/15 0012  WBC 6.2  HGB 11.1*  HCT 33.0*  PLT 128*   ------------------------------------------------------------------------------------------------------------------  Chemistries   Recent Labs Lab 07/10/15 0012  NA 138  K 4.7  CL 110  CO2 24   GLUCOSE 163*  BUN 45*  CREATININE 1.52*  CALCIUM 8.5*   ------------------------------------------------------------------------------------------------------------------  Cardiac Enzymes No results for input(s): TROPONINI in the last 168 hours. ------------------------------------------------------------------------------------------------------------------  RADIOLOGY:  Dg Hip Unilat With Pelvis 2-3 Views Right  07/09/2015  CLINICAL DATA:  Status post fall in kitchen, with right thigh pain. Initial encounter. EXAM: DG HIP (WITH OR WITHOUT PELVIS) 2-3V RIGHT COMPARISON:  Right femur radiographs performed 07/04/2015 FINDINGS: There is suspicion of a minimally displaced transcervical fracture of the right femoral neck. Both femoral heads are seated normally within their respective acetabula. No significant degenerative change is appreciated. The sacroiliac joints are unremarkable in appearance. The visualized bowel gas pattern is grossly unremarkable in appearance. Postoperative change is noted at the right inguinal region. Scattered vascular calcifications are seen. IMPRESSION: Apparent minimally displaced transcervical fracture of the right femoral neck. Electronically Signed   By: Garald Balding M.D.   On: 07/09/2015 23:39    EKG:   Orders placed or performed in visit on 06/18/13  . EKG 12-Lead    IMPRESSION AND PLAN:  Principal Problem:   Fracture of femoral neck, right (HCC) - orthopedics on board, we'll defer to their recommendations, maintain pain control Active Problems:   Type 2 diabetes mellitus (HCC) - sliding scale insulin with corresponding glucose checks every 6 hours while nothing by mouth. Will need a heart healthy/carb modified diet when she is eating again, and insulin and glucose checks changed to before meals at bedtime.   COPD (chronic obstructive pulmonary disease) (HCC) - continue home inhalers   CAD (coronary artery disease) - stable, continue home meds   HTN  (hypertension) - controlled, continue home meds   Chronic diastolic CHF (congestive heart failure) (HCC) - stable, continue home meds   A-fib (HCC) - paroxysmal, not currently in A. fib, continue rate controlling medications   GERD (gastroesophageal reflux disease) - equivalent home dose PPI   CKD (chronic kidney disease), stage III - monitor, avoid nephrotoxins  All the records are reviewed and case discussed with ED provider. Management plans discussed with the patient and/or family.  DVT PROPHYLAXIS: mechanical only  ADMISSION STATUS: Inpatient  CODE STATUS:     Code Status Orders        Start     Ordered   07/10/15 0010  Full code   Continuous     07/10/15 0021  Full Code  TOTAL TIME TAKING CARE OF THIS PATIENT: 45 minutes.    Rune Mendez [place] 07/10/2015, 1:30 AM  [facility]  Office  [phone]  CC: Primary care physician; Alessandra Grout, MD

## 2015-07-10 NOTE — Progress Notes (Signed)
Initial Nutrition Assessment   INTERVENTION:   Coordination of Care: await diet advancement as medically able Medical Food Supplement Therapy: will recommend Glucerna Shake po BID, each supplement provides 220 kcal and 10 grams of protein   NUTRITION DIAGNOSIS:   Inadequate oral intake related to inability to eat as evidenced by NPO status.  GOAL:   Patient will meet greater than or equal to 90% of their needs  MONITOR:    (Energy Intake, Electrolyte and renal Profile, Anthropometrics, Digestive System)  REASON FOR ASSESSMENT:   Consult Hip fracture protocol  ASSESSMENT:   Pt admitted s/p fall with right hip fracture scheduled for surgical intervention 07/10/2015.  Past Medical History  Diagnosis Date  . Breast cancer ([place])   . Hypertension   . Diabetes mellitus without complication ([place])   . Allergy   . Depression   . COPD (chronic obstructive pulmonary disease) ([place])   . A-fib ([place])   . CAD (coronary artery disease)   . Chronic diastolic heart failure ([place])   . Pulmonary HTN ([place])   . CKD (chronic kidney disease), stage III      Diet Order:  Diet NPO time specified Except for: Ice Chips, Sips with Meds    Current Nutrition: Pt NPO  Food/Nutrition-Related History: Pt reports poor po intake for the past few days secondary to diarrhea. Pt reports not drinking fluids/eating because she would have to go to the bathroom every hour otherwise.   Scheduled Medications:  . aspirin EC  81 mg Oral Daily  . atorvastatin  40 mg Oral q1800  . carvedilol  25 mg Oral BID WC  .  ceFAZolin (ANCEF) IV  2 g Intravenous To OR  . cefTRIAXone (ROCEPHIN)  IV  1 g Intravenous Q24H  . citalopram  10 mg Oral Daily  . ferrous sulfate  325 mg Oral TID PC  . insulin aspart  0-9 Units Subcutaneous Q6H  . isosorbide mononitrate  30 mg Oral Daily  . pantoprazole  40 mg Oral BID  . senna-docusate  1 tablet Oral BID  . sodium chloride  3 mL Intravenous Q12H  . tiotropium  18 mcg  Inhalation Daily    Continuous Medications:  . sodium chloride 100 mL/hr at 07/10/15 0542     Electrolyte/Renal Profile and Glucose Profile:   Recent Labs Lab 07/10/15 0012  NA 138  K 4.7  CL 110  CO2 24  BUN 45*  CREATININE 1.52*  CALCIUM 8.5*  GLUCOSE 163*   Protein Profile: No results for input(s): ALBUMIN in the last 168 hours.  Gastrointestinal Profile: Last BM: 07/08/2015   Nutrition-Focused Physical Exam Findings:  Unable to complete Nutrition-Focused physical exam at this time.    Weight Change: Pt reports UBW of 117lbs.   Height:   Ht Readings from Last 1 Encounters:  07/09/15 5\' 4"  (1.626 m)    Weight:   Wt Readings from Last 1 Encounters:  07/10/15 121 lb (54.885 kg)   Wt Readings from Last 10 Encounters:  07/10/15 121 lb (54.885 kg)  07/04/15 117 lb (53.071 kg)  04/15/15 119 lb 11.4 oz (54.3 kg)     BMI:  Body mass index is 20.76 kg/(m^2).  Estimated Nutritional Needs:   Kcal:  BEE: 1063kcals, TEE: (IF 1.1-1.3)(AF 1.2) 1403-1658kcals  Protein:  60-71g protein (1.1-1.3g/kg)  Fluid:  1370-1689mL of fluid (25-56mL/kg)  EDUCATION NEEDS:   No education needs identified at this time   [place], RD, LDN Pager [phone]

## 2015-07-10 NOTE — Clinical Social Work Note (Signed)
Clinical Social Work Assessment  Patient Details  Name: Anita Wilson MRN: [id number] Date of Birth: 02-11-1946  Date of referral:  07/10/15               Reason for consult:  Facility Placement                Permission sought to share information with:  Chartered certified accountant granted to share information::  Yes, Verbal Permission Granted  Name::      [place]::   [place]   Relationship::     Contact Information:     Housing/Transportation Living arrangements for the past 2 months:  [place] of Information:  Patient Patient Interpreter Needed:  None Criminal Activity/Legal Involvement Pertinent to Current Situation/Hospitalization:  No - Comment as needed Significant Relationships:  Adult Children, Siblings Lives with:  Self Do you feel safe going back to the place where you live?  Yes Need for family participation in patient care:  Yes (Comment)  Care giving concerns: Patient lives alone in [place].    Social Worker assessment / plan:  Holiday representative (CSW) received SNF consult. CSW met with patient prior to surgery today. Patient's sister Bethena Roys was at bedside. Patient was laying in bed and was alert and oriented. Patient appeared to be in pain. CSW introduced self and explained role of CSW department. Patient reported that she lives in [place] alone. Patient's daughter [name] lives in [place] and sister Bethena Roys lives in [place]. CSW explained that PT will evaluate patient tomorrow and recommend home health or SNF. Patient is agreeable to SNF search in [facility].   FL2 complete and faxed out. CSW will continue to follow and assist as needed.   Employment status:  Disabled (Comment on whether or not currently receiving Disability), Retired Nurse, adult PT Recommendations:  Not assessed at this time Information / Referral to community resources:  [place]  Patient/Family's Response to care:  Patient is agreeable to [facility] in [place].  Patient/Family's Understanding of and Emotional Response to Diagnosis, Current Treatment, and Prognosis: Patient was pleasant throughout assessment and thanked CSW for visit.   Emotional Assessment Appearance:  Appears stated age Attitude/Demeanor/Rapport:    Affect (typically observed):  Accepting, Adaptable, Pleasant Orientation:  Oriented to Self, Oriented to Place, Oriented to  Time, Oriented to Situation Alcohol / Substance use:  Not Applicable Psych involvement (Current and /or in the community):  No (Comment)  Discharge Needs  Concerns to be addressed:  Discharge Planning Concerns Readmission within the last 30 days:  No Current discharge risk:  Dependent with Mobility Barriers to Discharge:  Continued Medical Work up   Loralyn Freshwater, LCSW 07/10/2015, 2:03 PM

## 2015-07-10 NOTE — ED Notes (Signed)
Pt assisted to restroom via wheelchair

## 2015-07-10 NOTE — Consult Note (Signed)
ORTHOPAEDIC CONSULTATION  PATIENT NAME: Anita Wilson DOB: 09-Apr-1946  MRN: [id number]  REQUESTING PHYSICIAN: Epifanio Lesches, MD  Chief Complaint: Right hip pain  HPI: Anita Wilson is a 69 y.o. female who complains of  right hip and groin pain. The patient sustained a fall on July 04, 2015 and landed on her right hip and side. She was evaluated in the ER and radiographs were negative. She has had persistent hip pain and was using a walker for ambulation. Earlier this evening she tripped and fell in her home. She reported worsening of the right hip pain with inability to stand or bear weight on the right lower extremity due to the pain. She denied any loss of consciousness or other injuries.  Past Medical History  Diagnosis Date  . Breast cancer ([place])   . Hypertension   . Diabetes mellitus without complication ([place])   . Allergy   . Depression   . COPD (chronic obstructive pulmonary disease) ([place])   . A-fib ([place])   . CAD (coronary artery disease)   . Chronic diastolic heart failure ([place])   . Pulmonary HTN ([place])   . CKD (chronic kidney disease), stage III    Past Surgical History  Procedure Laterality Date  . Coronary artery bypass graft    . Mastectomy    . Parathyroidectomy    . Appendectomy    . Breast lumpectomy with axillary lymph node dissection     Social History   Social History  . Marital Status: Single    Spouse Name: N/A  . Number of Children: N/A  . Years of Education: N/A   Social History Main Topics  . Smoking status: Current Some Day Smoker  . Smokeless tobacco: Never Used  . Alcohol Use: Yes     Comment: Occassionally  . Drug Use: No  . Sexual Activity: Not Asked   Other Topics Concern  . None   Social History Narrative   Family History  Problem Relation Age of Onset  . Hypertension Mother   . Diabetes Mother   . Heart disease Father   . Diabetes Father   . Diabetes Sister   . Breast cancer Sister   . Dementia Sister     Allergies  Allergen Reactions  . Bee Venom Shortness Of Breath and Swelling  . Diphenhydramine     Other reaction(s): Other (See Comments) "took tylenol pm and ended up in hospital with something wrong with my heart"  . Morphine     Other reaction(s): Other (See Comments) hallucination  . Phenylalanine Diarrhea    "upset stomach"  . Sulfa Antibiotics     Other reaction(s): Other (See Comments) "metalic smell and taste"   Prior to Admission medications   Medication Sig Start Date End Date Taking? Authorizing Provider  alendronate (FOSAMAX) 70 MG tablet Take 70 mg by mouth. 10/04/14 10/04/15  Historical Provider, MD  aspirin EC 81 MG tablet Take 81 mg by mouth. 06/06/12   Historical Provider, MD  atorvastatin (LIPITOR) 40 MG tablet Take 40 mg by mouth. 09/21/14 09/21/15  Historical Provider, MD  carvedilol (COREG) 25 MG tablet Take 25 mg by mouth. 09/21/14 09/21/15  Historical Provider, MD  citalopram (CELEXA) 20 MG tablet Take 20 mg by mouth. 09/21/14 09/21/15  Historical Provider, MD  EPINEPHrine (EPIPEN JR) 0.15 MG/0.3ML injection Inject 0.15 mg into the skin. 03/16/14   Historical Provider, MD  ferrous sulfate 325 (65 FE) MG EC tablet Take 325 mg by mouth. 09/09/11   Historical  Provider, MD  fluticasone Asencion Islam) 50 MCG/ACT nasal spray Use 2 sprays in each  nostril daily 09/25/14   Historical Provider, MD  glipiZIDE (GLUCOTROL) 10 MG tablet Take 10 mg by mouth. 09/24/14   Historical Provider, MD  glucose blood (ONE TOUCH ULTRA TEST) test strip  01/04/14   Historical Provider, MD  hydrALAZINE (APRESOLINE) 10 MG tablet Take 10 mg by mouth. 09/21/14 09/21/15  Historical Provider, MD  insulin glargine (LANTUS) 100 UNIT/ML injection Inject into the skin. 03/28/15   Historical Provider, MD  Insulin Syringe-Needle U-100 (INSULIN SYRINGE .3CC/31GX5/16") 31G X 5/16" 0.3 ML MISC  05/04/14   Historical Provider, MD  isosorbide mononitrate (IMDUR) 30 MG 24 hr tablet Take 30 mg by mouth. 09/24/14 09/24/15   Historical Provider, MD  Multiple Vitamin (MULTI-VITAMINS) TABS Take by mouth. 11/14/07   Historical Provider, MD  omeprazole (PRILOSEC) 20 MG capsule Take 20 mg by mouth. 09/27/14   Historical Provider, MD  oxyCODONE-acetaminophen (ROXICET) 5-325 MG tablet Take 1-2 tablets by mouth every 4 (four) hours as needed for severe pain. 07/04/15   Pierce Crane Beers, PA-C  tiotropium (SPIRIVA HANDIHALER) 18 MCG inhalation capsule Place into inhaler and inhale. 10/22/14 10/22/15  Historical Provider, MD   Dg Hip Unilat With Pelvis 2-3 Views Right  07/09/2015  CLINICAL DATA:  Status post fall in kitchen, with right thigh pain. Initial encounter. EXAM: DG HIP (WITH OR WITHOUT PELVIS) 2-3V RIGHT COMPARISON:  Right femur radiographs performed 07/04/2015 FINDINGS: There is suspicion of a minimally displaced transcervical fracture of the right femoral neck. Both femoral heads are seated normally within their respective acetabula. No significant degenerative change is appreciated. The sacroiliac joints are unremarkable in appearance. The visualized bowel gas pattern is grossly unremarkable in appearance. Postoperative change is noted at the right inguinal region. Scattered vascular calcifications are seen. IMPRESSION: Apparent minimally displaced transcervical fracture of the right femoral neck. Electronically Signed   By: Garald Balding M.D.   On: 07/09/2015 23:39    Positive ROS: All other systems have been reviewed and were otherwise negative with the exception of those mentioned in the HPI and as above.  Physical Exam: General: Alert and alert in no acute distress. HEENT: Atraumatic and normocephalic. Sclera are clear. Extraocular motion is intact. Oropharynx is clear with moist mucosa. Neck: Supple, nontender, good range of motion. No JVD or carotid bruits. Lungs: Clear to auscultation bilaterally. Cardiovascular: Regular rate and rhythm with normal S1 and S2. No murmurs. No gallops or rubs. Pedal pulses are  palpable bilaterally. Homans test is negative bilaterally. No significant pretibial or ankle edema. Abdomen: Soft, nontender, and nondistended. Bowel sounds are present. Skin: No lesions in the area of chief complaint Neurologic: Awake, alert, and oriented. Sensory function is grossly intact. Motor strength is felt to be 5 over 5 bilaterally. No clonus or tremor. Good motor coordination. Lymphatic: No axillary or cervical lymphadenopathy  MUSCULOSKELETAL: [place] traction is in place to the right lower extremity. No tenderness to palpation to the right ankle or knee. No knee effusion. Pain is elicited by any attempted range of motion of the right hip. No significant shortening or rotation to the right lower extremity.  Assessment: Nondisplaced right femoral neck fracture   Plan: The findings were discussed in detail with the patient. We discussed the option of percutaneous pinning of the femoral neck fracture. The risks and benefits of surgical intervention were discussed. The possible need to consider hip hemiarthroplasty was also discussed. The usual perioperative course was discussed. The risks  and benefits of surgical intervention were reviewed. The patient expressed understanding of the risks and benefits and agreed with plans for surgical intervention.   The surgical site was signed as per the "right site surgery" protocol.   Laquinn Shippy P. [place] Bouche M.D.

## 2015-07-10 NOTE — Clinical Social Work Placement (Signed)
   CLINICAL SOCIAL WORK PLACEMENT  NOTE  Date:  07/10/2015  Patient Details  Name: Anita Wilson MRN: [id number] Date of Birth: March 05, 1946  Clinical Social Work is seeking post-discharge placement for this patient at the [place] level of care (*CSW will initial, date and re-position this form in  chart as items are completed):  Yes   Patient/family provided with [place] Work Department's list of facilities offering this level of care within the geographic area requested by the patient (or if unable, by the patient's family).  Yes   Patient/family informed of their freedom to choose among providers that offer the needed level of care, that participate in Medicare, Medicaid or managed care program needed by the patient, have an available bed and are willing to accept the patient.  Yes   Patient/family informed of [place]'s ownership interest in [facility] and [facility], as well as of the fact that they are under no obligation to receive care at these facilities.  PASRR submitted to EDS on 07/10/15     PASRR number received on 07/10/15     Existing PASRR number confirmed on       FL2 transmitted to all facilities in geographic area requested by pt/family on 07/10/15     FL2 transmitted to all facilities within larger geographic area on       Patient informed that his/her managed care company has contracts with or will negotiate with certain facilities, including the following:            Patient/family informed of bed offers received.  Patient chooses bed at       Physician recommends and patient chooses bed at      Patient to be transferred to   on  .  Patient to be transferred to facility by       Patient family notified on   of transfer.  Name of family member notified:        PHYSICIAN Please sign FL2     Additional Comment:    _______________________________________________ Loralyn Freshwater,  LCSW 07/10/2015, 2:01 PM

## 2015-07-10 NOTE — Anesthesia Preprocedure Evaluation (Signed)
Anesthesia Evaluation  Patient identified by MRN, date of birth, ID band Patient awake    Reviewed: Allergy & Precautions, H&P , NPO status , Patient's Chart, lab work & pertinent test results, reviewed documented beta blocker date and time   Airway Mallampati: II  TM Distance: >3 FB Neck ROM: full    Dental no notable dental hx.    Pulmonary neg pulmonary ROS, COPD, Current Smoker,    Pulmonary exam normal breath sounds clear to auscultation       Cardiovascular Exercise Tolerance: Good hypertension, + CAD and +CHF  negative cardio ROS   Rhythm:regular Rate:Normal     Neuro/Psych PSYCHIATRIC DISORDERS negative neurological ROS  negative psych ROS   GI/Hepatic negative GI ROS, Neg liver ROS, GERD  ,  Endo/Other  negative endocrine ROSdiabetes  Renal/GU Renal diseasenegative Renal ROS  negative genitourinary   Musculoskeletal   Abdominal   Peds  Hematology negative hematology ROS (+)   Anesthesia Other Findings   Reproductive/Obstetrics negative OB ROS                             Anesthesia Physical Anesthesia Plan  ASA: III and emergent  Anesthesia Plan: General   Post-op Pain Management:    Induction:   Airway Management Planned:   Additional Equipment:   Intra-op Plan:   Post-operative Plan:   Informed Consent: I have reviewed the patients History and Physical, chart, labs and discussed the procedure including the risks, benefits and alternatives for the proposed anesthesia with the patient or authorized representative who has indicated his/her understanding and acceptance.   Dental Advisory Given  Plan Discussed with: CRNA  Anesthesia Plan Comments:         Anesthesia Quick Evaluation

## 2015-07-10 NOTE — Plan of Care (Addendum)
Pt pre meds all given - ACHS checks clomplete and beteblockers given.  Rt hip wiped/cleaned with HCG cloths. Pain meds and Ice kept current for pt.  Consents sign (including Blood). Pt and family informed of surgery process (inclusing pre and post op expectations. Urine sample sent down to lab. Foley emptied before transport. Pt has been NPO since midnight.  Glasses and dentures removed and given to family to hold.  No jewlery was brought to hospital.  Rocephine hung and Ancef sent down to OR with pt. Report called to OR RN.  Pt will be transported when ready on 2L [place].

## 2015-07-10 NOTE — Care Management Note (Signed)
Case Management Note  Patient Details  Name: Anita Wilson MRN: [id number] Date of Birth: 09-Mar-1946  Subjective/Objective:                   Met with patient to discuss discharge planning prior to surgery today. She is from home alone. Her sister Bethena Roys is at bedside. O2 is new to this patient. She has a history of Home health she believes through [facility] for home IV antibiotics. She has no preference this visit. She has had a couple of falls and started using her sisters walker. She also has a bedside commode. She uses [facility] on [address] for Rx.  Action/Plan:   RNCM will continue to follow. List of home health agencies left with patient.   Expected Discharge Date:                  Expected Discharge Plan:     In-House Referral:     Discharge planning Services  CM Consult  Post Acute Care Choice:  Durable Medical Equipment, Home Health Choice offered to:  Patient  DME Arranged:    DME Agency:     HH Arranged:    [place] Agency:     Status of Service:  In process, will continue to follow  Medicare Important Message Given:    Date Medicare IM Given:    Medicare IM give by:    Date Additional Medicare IM Given:    Additional Medicare Important Message give by:     If discussed at [place] of Stay Meetings, dates discussed:    Additional Comments:  Marshell Garfinkel, RN 07/10/2015, 11:09 AM

## 2015-07-10 NOTE — NC FL2 (Signed)
[place] LEVEL OF CARE SCREENING TOOL     IDENTIFICATION  Patient Name: Anita Wilson [id number] Birthdate: [birth year] Sex: female Admission Date (Current Location): 07/09/2015  [place] and [place] Number:  [facility] )   Facility and Address:  [facility], [address] [place], [place] [address]      Provider Number: [id number] [phone])  Attending Physician Name and Address:  Epifanio Lesches, MD  Relative Name and Phone Number:       Current Level of Care: Hospital Recommended Level of Care: [place] Prior Approval Number:    Date Approved/Denied:   PASRR Number:  ([id number])  Discharge Plan: SNF    Current Diagnoses: Patient Active Problem List   Diagnosis Date Noted  . Fracture of femoral neck, right ([place]) 07/10/2015  . Type 2 diabetes mellitus ([place]) 07/10/2015  . GERD (gastroesophageal reflux disease) 07/10/2015  . COPD (chronic obstructive pulmonary disease) ([place]) 07/10/2015  . CAD (coronary artery disease) 07/10/2015  . HTN (hypertension) 07/10/2015  . Chronic diastolic CHF (congestive heart failure) ([place]) 07/10/2015  . CKD (chronic kidney disease), stage III 07/10/2015  . A-fib ([place]) 07/10/2015    Orientation ACTIVITIES/SOCIAL BLADDER RESPIRATION    Self, Time, Situation, Place  Active Continent O2 (As needed) (2 Liters oxygen )  BEHAVIORAL SYMPTOMS/MOOD NEUROLOGICAL BOWEL NUTRITION STATUS   (none )  (none ) Continent Diet (NPO for surgery )  PHYSICIAN VISITS COMMUNICATION OF NEEDS Height & Weight Skin  30 days Verbally 5\' 4"  (162.6 cm) 121 lbs. Surgical wounds          AMBULATORY STATUS RESPIRATION    Assist extensive O2 (As needed) (2 Liters oxygen )      Personal Care Assistance Level of Assistance  Bathing, Feeding, Dressing Bathing Assistance: Limited assistance Feeding assistance: Independent Dressing Assistance: Limited assistance      Functional Limitations Info   Sight, Hearing, Speech Sight Info: Adequate Hearing Info: Adequate Speech Info: Adequate       SPECIAL CARE FACTORS FREQUENCY  PT (By licensed PT), OT (By licensed OT)     PT Frequency:  (5) OT Frequency:  (5)           Additional Factors Info  Code Status, Allergies Code Status Info:  (Full Code. ) Allergies Info:  (Bee Venom, Diphenhydramine, Morphine, Phenylalanine, Sulfa Antibiotics)           Current Medications (07/10/2015): Current Facility-Administered Medications  Medication Dose Route Frequency Provider Last Rate Last Dose  . 0.9 %  sodium chloride infusion   Intravenous Continuous Dereck Leep, MD 100 mL/hr at 07/10/15 0542    . aspirin EC tablet 81 mg  81 mg Oral Daily Lance Coon, MD      . atorvastatin (LIPITOR) tablet 40 mg  40 mg Oral q1800 Lance Coon, MD      . carvedilol (COREG) tablet 25 mg  25 mg Oral BID WC Lance Coon, MD   25 mg at 07/10/15 0908  . ceFAZolin (ANCEF) IVPB 2 g/50 mL premix  2 g Intravenous To OR Dereck Leep, MD      . citalopram (CELEXA) tablet 10 mg  10 mg Oral Daily Lance Coon, MD      . ferrous sulfate tablet 325 mg  325 mg Oral TID PC Dereck Leep, MD      . HYDROmorphone (DILAUDID) injection 0.5 mg  0.5 mg Intravenous Q2H PRN Dereck Leep, MD   0.5 mg at 07/10/15 1135  .  insulin aspart (novoLOG) injection 0-9 Units  0-9 Units Subcutaneous Q6H Lance Coon, MD   0 Units at 07/10/15 [phone]  . isosorbide mononitrate (IMDUR) 24 hr tablet 30 mg  30 mg Oral Daily Lance Coon, MD      . oxyCODONE (Oxy IR/ROXICODONE) immediate release tablet 5-10 mg  5-10 mg Oral Q4H PRN Dereck Leep, MD      . pantoprazole (PROTONIX) EC tablet 40 mg  40 mg Oral BID Dereck Leep, MD   40 mg at 07/10/15 0313  . senna-docusate (Senokot-S) tablet 1 tablet  1 tablet Oral BID Dereck Leep, MD   1 tablet at 07/10/15 0313  . sodium chloride 0.9 % injection 3 mL  3 mL Intravenous Q12H Lance Coon, MD   3 mL at 07/10/15 0313  . tiotropium  (SPIRIVA) inhalation capsule 18 mcg  18 mcg Inhalation Daily Lance Coon, MD      . traMADol Veatrice Bourbon) tablet 50-100 mg  50-100 mg Oral Q4H PRN Dereck Leep, MD       Do not use this list as official medication orders. Please verify with discharge summary.  Discharge Medications:   Medication List    ASK your doctor about these medications        aspirin EC 81 MG tablet  Take 81 mg by mouth daily.     atorvastatin 40 MG tablet  Commonly known as:  LIPITOR  Take 40 mg by mouth daily at 6 PM.     carvedilol 25 MG tablet  Commonly known as:  COREG  Take 25 mg by mouth 2 (two) times daily with a meal.     citalopram 10 MG tablet  Commonly known as:  CELEXA  Take 10 mg by mouth daily.     CLONIDINE HCL PO  Take 1 tablet by mouth daily.     EPINEPHrine 0.15 MG/0.3ML injection  Commonly known as:  EPIPEN JR  Inject 0.15 mg into the skin.     ferrous sulfate 325 (65 FE) MG EC tablet  Take 325 mg by mouth daily with breakfast.     fluticasone 50 MCG/ACT nasal spray  Commonly known as:  FLONASE  Use 2 sprays in each  nostril daily     FOSAMAX 70 MG tablet  Generic drug:  alendronate  Take 70 mg by mouth once a week.     glipiZIDE 10 MG tablet  Commonly known as:  GLUCOTROL  Take 10 mg by mouth 2 (two) times daily before a meal.     hydrALAZINE 10 MG tablet  Commonly known as:  APRESOLINE  Take 10 mg by mouth 3 (three) times daily.     INSULIN SYRINGE .3CC/31GX5/16" 31G X 5/16" 0.3 ML Misc     isosorbide mononitrate 30 MG 24 hr tablet  Commonly known as:  IMDUR  Take 30 mg by mouth daily.     LANTUS 100 UNIT/ML injection  Generic drug:  insulin glargine  Inject 10-16 Units into the skin at bedtime.     letrozole 2.5 MG tablet  Commonly known as:  FEMARA  Take 2.5 mg by mouth daily.     MULTI-VITAMINS Tabs  Take 1 tablet by mouth daily.     omeprazole 20 MG capsule  Commonly known as:  PRILOSEC  Take 20 mg by mouth 2 (two) times daily before a meal.      ONE TOUCH ULTRA TEST test strip  Generic drug:  glucose blood     oxyCODONE-acetaminophen  5-325 MG tablet  Commonly known as:  ROXICET  Take 1-2 tablets by mouth every 4 (four) hours as needed for severe pain.     SPIRIVA HANDIHALER 18 MCG inhalation capsule  Generic drug:  tiotropium  Place into inhaler and inhale.        Relevant Imaging Results:  Relevant Lab Results:  Recent Labs    Additional Information  (SSN: [id number])  Loralyn Freshwater, LCSW

## 2015-07-10 NOTE — Transfer of Care (Signed)
Immediate Anesthesia Transfer of Care Note  Patient: Anita Wilson  Procedure(s) Performed: Procedure(s) with comments: CANNULATED HIP PINNING (Right) - right  Patient Location: PACU  Anesthesia Type:General and Spinal  Level of Consciousness: awake, alert , oriented and patient cooperative  Airway & Oxygen Therapy: Patient Spontanous Breathing and Patient connected to face mask oxygen  Post-op Assessment: Report given to RN, Post -op Vital signs reviewed and stable and Patient moving all extremities X 4  Post vital signs: Reviewed and stable  Last Vitals:  Filed Vitals:   07/10/15 1745  BP:   Pulse:   Temp: 36.9 C  Resp:     Complications: No apparent anesthesia complications

## 2015-07-10 NOTE — Progress Notes (Signed)
[place] at [place] NAME: Anita Wilson    MR#:  [id number]  DATE OF BIRTH:  Dec 26, 1945  SUBJECTIVE: CAD-69-year-old female patient with history of hypertension, diabetes mellitus type 2, COPD admitted for right hip pain secondary to fall. Noted to have right femoral neck fracture. Scheduled to have surgery today afternoon. Complains of a lot of hip pain with minimal movement.   CHIEF COManPLAINT:   Chief Complaint  Patient presents with  . Leg Pain    REVIEW OF SYSTEMS:   ROS CONSTITUTIONAL: No fever, fatigue or weakness.  EYES: No blurred or double vision.  EARS, NOSE, AND THROAT: No tinnitus or ear pain.  RESPIRATORY: No cough, shortness of breath, wheezing or hemoptysis.  CARDIOVASCULAR: No chest pain, orthopnea, edema.  GASTROINTESTINAL: No nausea, vomiting, diarrhea or abdominal pain.  GENITOURINARY: No dysuria, hematuria.  ENDOCRINE: No polyuria, nocturia,  HEMATOLOGY: No anemia, easy bruising or bleeding SKIN: No rash or lesion. MUSCULOSKEL. Right hip pain. No tingling, numbness, weakness.  PSYCHIATRY: No anxiety or depression.   DRUG ALLERGIES:   Allergies  Allergen Reactions  . Bee Venom Shortness Of Breath and Swelling  . Diphenhydramine     Other reaction(s): Other (See Comments) "took tylenol pm and ended up in hospital with something wrong with my heart"  . Morphine     Other reaction(s): Other (See Comments) hallucination  . Phenylalanine Diarrhea    "upset stomach"  . Sulfa Antibiotics     Other reaction(s): Other (See Comments) "metalic smell and taste"    VITALS:  Blood pressure 148/61, pulse 64, temperature 98.1 F (36.7 C), temperature source Oral, resp. rate 18, height 5\' 4"  (1.626 m), weight 54.885 kg (121 lb), SpO2 100 %.  PHYSICAL EXAMINATION:  GENERAL:  69 y.o.-year-old patient lying in the bed with no acute distress.  EYES: Pupils equal, round, reactive to light and accommodation. No  scleral icterus. Extraocular muscles intact.  HEENT: Head atraumatic, normocephalic. Oropharynx and nasopharynx clear.  NECK:  Supple, no jugular venous distention. No thyroid enlargement, no tenderness.  LUNGS: Normal breath sounds bilaterally, no wheezing, rales,rhonchi or crepitation. No use of accessory muscles of respiration.  CARDIOVASCULAR: S1, S2 normal. No murmurs, rubs, or gallops.  ABDOMEN: Soft, nontender, nondistended. Bowel sounds present. No organomegaly or mass.  EXTREMITIES: No pedal edema, cyanosis, or clubbing.  right hip in Buck's traction. NEUROLOGIC: Cranial nerves II through XII are intact. Muscle strength 5/5 in all extremities. Sensation intact. Gait not checked.  PSYCHIATRIC: The patient is alert and oriented x 3.  SKIN: No obvious rash, lesion, or ulcer.    LABORATORY PANEL:   CBC  Recent Labs Lab 07/10/15 0012  WBC 6.2  HGB 11.1*  HCT 33.0*  PLT 128*   ------------------------------------------------------------------------------------------------------------------  Chemistries   Recent Labs Lab 07/10/15 0012  NA 138  K 4.7  CL 110  CO2 24  GLUCOSE 163*  BUN 45*  CREATININE 1.52*  CALCIUM 8.5*   ------------------------------------------------------------------------------------------------------------------  Cardiac Enzymes No results for input(s): TROPONINI in the last 168 hours. ------------------------------------------------------------------------------------------------------------------  RADIOLOGY:  Dg Hip Unilat With Pelvis 2-3 Views Right  07/09/2015  CLINICAL DATA:  Status post fall in kitchen, with right thigh pain. Initial encounter. EXAM: DG HIP (WITH OR WITHOUT PELVIS) 2-3V RIGHT COMPARISON:  Right femur radiographs performed 07/04/2015 FINDINGS: There is suspicion of a minimally displaced transcervical fracture of the right femoral neck. Both femoral heads are seated normally within their respective acetabula. No significant  degenerative  change is appreciated. The sacroiliac joints are unremarkable in appearance. The visualized bowel gas pattern is grossly unremarkable in appearance. Postoperative change is noted at the right inguinal region. Scattered vascular calcifications are seen. IMPRESSION: Apparent minimally displaced transcervical fracture of the right femoral neck. Electronically Signed   By: Garald Balding M.D.   On: 07/09/2015 23:39    EKG:   Orders placed or performed in visit on 06/18/13  . EKG 12-Lead    ASSESSMENT AND PLAN:  #1 right hip fracture nondisplaced initial encounter: Patient going for surgery that afternoon. Moderate risk for surgery due to multiple medical problems. Continue pain medication. Patient can have incentive spirometry after surgery.   2. UTI follow urine cultures, UA was abnormal on admission. Start Rocephin, urine cultures to be followed.    #3 history of COPD: No wheezing. Continue Spiriva, albuterol. #4 hypertension continue home medications  5. acute renal failure secondary to ATN; continue IV hydration. #6 history of coronary artery disease status post CABG: Continue Imdur, Coreg, aspirin,statins  All the records are reviewed and case discussed with Care Management/Social Workerr. Management plans discussed with the patient, family and they are in agreement.  CODE STATUS: full  TOTAL TIME TAKING CARE OF THIS PATIENT: 35 minutes.   POSSIBLE D/C IN 3-4 DAYS, DEPENDING ON CLINICAL CONDITION.   Epifanio Lesches M.D on 07/10/2015 at 1:31 PM  Between 7am to 6pm - Pager - [phone]  After 6pm go to [email or web address] - password [email or web address] [facility]  Office  [phone]  CC: Primary care physician; Alessandra Grout, MD   Note: This dictation was prepared with Dragon dictation along with smaller phrase technology. Any transcriptional errors that result from this process are unintentional.

## 2015-07-11 ENCOUNTER — Encounter: Payer: Self-pay | Admitting: Surgery

## 2015-07-11 LAB — CBC WITH DIFFERENTIAL/PLATELET
BASOS PCT: 0 %
Basophils Absolute: 0 10*3/uL (ref 0–0.1)
EOS ABS: 0.1 10*3/uL (ref 0–0.7)
EOS PCT: 2 %
HCT: 28.4 % — ABNORMAL LOW (ref 35.0–47.0)
Hemoglobin: 9.4 g/dL — ABNORMAL LOW (ref 12.0–16.0)
LYMPHS ABS: 0.7 10*3/uL — AB (ref 1.0–3.6)
Lymphocytes Relative: 11 %
MCH: 31.7 pg (ref 26.0–34.0)
MCHC: 33.2 g/dL (ref 32.0–36.0)
MCV: 95.5 fL (ref 80.0–100.0)
MONOS PCT: 13 %
Monocytes Absolute: 0.8 10*3/uL (ref 0.2–0.9)
NEUTROS PCT: 74 %
Neutro Abs: 4.7 10*3/uL (ref 1.4–6.5)
PLATELETS: 131 10*3/uL — AB (ref 150–440)
RBC: 2.98 MIL/uL — ABNORMAL LOW (ref 3.80–5.20)
RDW: 13.5 % (ref 11.5–14.5)
WBC: 6.4 10*3/uL (ref 3.6–11.0)

## 2015-07-11 LAB — BASIC METABOLIC PANEL
Anion gap: 7 (ref 5–15)
BUN: 29 mg/dL — AB (ref 6–20)
CALCIUM: 8 mg/dL — AB (ref 8.9–10.3)
CHLORIDE: 112 mmol/L — AB (ref 101–111)
CO2: 21 mmol/L — AB (ref 22–32)
CREATININE: 1.25 mg/dL — AB (ref 0.44–1.00)
GFR calc non Af Amer: 43 mL/min — ABNORMAL LOW (ref >60)
GFR, EST AFRICAN AMERICAN: 50 mL/min — AB (ref >60)
Glucose, Bld: 166 mg/dL — ABNORMAL HIGH (ref 65–99)
Potassium: 4.9 mmol/L (ref 3.5–5.1)
SODIUM: 140 mmol/L (ref 135–145)

## 2015-07-11 LAB — GLUCOSE, CAPILLARY
GLUCOSE-CAPILLARY: 109 mg/dL — AB (ref 65–99)
GLUCOSE-CAPILLARY: 184 mg/dL — AB (ref 65–99)
GLUCOSE-CAPILLARY: 165 mg/dL — AB (ref 65–99)
GLUCOSE-CAPILLARY: 228 mg/dL — AB (ref 65–99)
GLUCOSE-CAPILLARY: 151 mg/dL — AB (ref 65–99)
Glucose-Capillary: 187 mg/dL — ABNORMAL HIGH (ref 65–99)
Glucose-Capillary: 167 mg/dL — ABNORMAL HIGH (ref 65–99)
Glucose-Capillary: 178 mg/dL — ABNORMAL HIGH (ref 65–99)

## 2015-07-11 MED ORDER — INSULIN ASPART 100 UNIT/ML ~~LOC~~ SOLN
0.0000 [IU] | Freq: Three times a day (TID) | SUBCUTANEOUS | Status: DC
  Administered 2015-07-11 – 2015-07-13 (×6): 2 [IU] via SUBCUTANEOUS
  Filled 2015-07-11 (×2): qty 2
  Filled 2015-07-11: qty 4
  Filled 2015-07-11 (×2): qty 2
  Filled 2015-07-11: qty 4
  Filled 2015-07-11: qty 2

## 2015-07-11 NOTE — Progress Notes (Signed)
[place] [facility] [place] NAME: Anita Wilson    MR#:  [id number]  DATE OF BIRTH:  [birth year]  SUBJECTIVE: 69 yr old  female patient with history of hypertension, diabetes mellitus type 2, COPD admitted for right hip pain secondary to fall. Noted to have right femoral neck fracture. It is post right ORIF yesterday. Has  lot of pain in the right hip with minimal movement. Otherwise no other complaints.   CHIEF COManPLAINT:   Chief Complaint  Patient presents with  . Leg Pain    REVIEW OF SYSTEMS:   Review of Systems  Musculoskeletal:       Complains of pain in the right hip..   CONSTITUTIONAL: No fever, fatigue or weakness.  EYES: No blurred or double vision.  EARS, NOSE, AND THROAT: No tinnitus or ear pain.  RESPIRATORY: No cough, shortness of breath, wheezing or hemoptysis.  CARDIOVASCULAR: No chest pain, orthopnea, edema.  GASTROINTESTINAL: No nausea, vomiting, diarrhea or abdominal pain.  GENITOURINARY: No dysuria, hematuria.  ENDOCRINE: No polyuria, nocturia,  HEMATOLOGY: No anemia, easy bruising or bleeding SKIN: No rash or lesion. MUSCULOSKEL. Right hip pain. No tingling, numbness, weakness.  PSYCHIATRY: No anxiety or depression.   DRUG ALLERGIES:   Allergies  Allergen Reactions  . Bee Venom Shortness Of Breath and Swelling  . Diphenhydramine     Other reaction(s): Other (See Comments) "took tylenol pm and ended up in hospital with something wrong with my heart"  . Morphine     Other reaction(s): Other (See Comments) hallucination  . Phenylalanine Diarrhea    "upset stomach"  . Sulfa Antibiotics     Other reaction(s): Other (See Comments) "metalic smell and taste"    VITALS:  Blood pressure 157/50, pulse 89, temperature 98.6 F (37 C), temperature source Oral, resp. rate 18, height 5\' 4"  (1.626 m), weight 54.885 kg (121 lb), SpO2 97 %.  PHYSICAL EXAMINATION:  GENERAL:  69 y.o.-year-old patient lying in the  bed with no acute distress.  EYES: Pupils equal, round, reactive to light and accommodation. No scleral icterus. Extraocular muscles intact.  HEENT: Head atraumatic, normocephalic. Oropharynx and nasopharynx clear.  NECK:  Supple, no jugular venous distention. No thyroid enlargement, no tenderness.  LUNGS: Normal breath sounds bilaterally, no wheezing, rales,rhonchi or crepitation. No use of accessory muscles of respiration.  CARDIOVASCULAR: S1, S2 normal. No murmurs, rubs, or gallops.  ABDOMEN: Soft, nontender, nondistended. Bowel sounds present. No organomegaly or mass.  EXTREMITIES:  status post surgery, dressing present in the right hip. NEUROLOGIC: Cranial nerves II through XII are intact. Muscle strength 5/5 in all extremities. Sensation intact. Gait not checked.  PSYCHIATRIC: The patient is alert and oriented x 3.  SKIN: No obvious rash, lesion, or ulcer.    LABORATORY PANEL:   CBC  Recent Labs Lab 07/11/15 0611  WBC 6.4  HGB 9.4*  HCT 28.4*  PLT 131*   ------------------------------------------------------------------------------------------------------------------  Chemistries   Recent Labs Lab 07/11/15 0611  NA 140  K 4.9  CL 112*  CO2 21*  GLUCOSE 166*  BUN 29*  CREATININE 1.25*  CALCIUM 8.0*   ------------------------------------------------------------------------------------------------------------------  Cardiac Enzymes No results for input(s): TROPONINI in the last 168 hours. ------------------------------------------------------------------------------------------------------------------  RADIOLOGY:  Dg Hip Operative Unilat With Pelvis Right  07/10/2015  CLINICAL DATA:  Right hip fracture fixation. EXAM: OPERATIVE RIGHT HIP (WITH PELVIS IF PERFORMED) 2 VIEWS TECHNIQUE: Fluoroscopic spot image(s) were submitted for interpretation post-operatively. COMPARISON:  Radiographs 07/09/2015. FINDINGS: Spot AP and  frog-leg lateral fluoroscopic images of the  right hip demonstrate fixation of femoral neck fracture with 3 cannulated screws. The hardware appears well positioned. No complications are identified. There are surgical clips in the right groin. IMPRESSION: No demonstrated complication following right femoral neck fixation. Electronically Signed   By: Richardean Sale M.D.   On: 07/10/2015 17:56   Dg Hip Unilat With Pelvis 2-3 Views Right  07/09/2015  CLINICAL DATA:  Status post fall in kitchen, with right thigh pain. Initial encounter. EXAM: DG HIP (WITH OR WITHOUT PELVIS) 2-3V RIGHT COMPARISON:  Right femur radiographs performed 07/04/2015 FINDINGS: There is suspicion of a minimally displaced transcervical fracture of the right femoral neck. Both femoral heads are seated normally within their respective acetabula. No significant degenerative change is appreciated. The sacroiliac joints are unremarkable in appearance. The visualized bowel gas pattern is grossly unremarkable in appearance. Postoperative change is noted at the right inguinal region. Scattered vascular calcifications are seen. IMPRESSION: Apparent minimally displaced transcervical fracture of the right femoral neck. Electronically Signed   By: Garald Balding M.D.   On: 07/09/2015 23:39    EKG:   Orders placed or performed in visit on 06/18/13  . EKG 12-Lead    ASSESSMENT AND PLAN:  #1 right hip fracture nondisplaced initial encounter:  Status post ORIF yesterday. Continue pain medications, DVT prophylaxis, incentive spirometry.  physical therapy recommends rehabilitation.   2. UTI follow urine cultures, UA was abnormal on admission. Started Rocephin,  Urine cultures show gram-negative rods.    #3 history of COPD: No wheezing. Continue Spiriva, albuterol. #4 hypertension continue home medications  5. acute renal failure secondary to ATN; continue IV hydration.,renal  function improved. #6 history of coronary artery disease status post CABG: Continue Imdur, Coreg,  aspirin,statins dispo to SNF/STR  All the records are reviewed and case discussed with Care Management/Social Workerr. Management plans discussed with the patient, family and they are in agreement.  CODE STATUS: full  TOTAL TIME TAKING CARE OF THIS PATIENT: 35 minutes.   POSSIBLE D/C IN 3-4 DAYS, DEPENDING ON CLINICAL CONDITION.   Epifanio Lesches M.D on 07/11/2015 at 1:21 PM  Between 7am to 6pm - Pager - [phone]  After 6pm go to [email or web address] - password [email or web address] [facility]  Office  [phone]  CC: Primary care physician; Alessandra Grout, MD   Note: This dictation was prepared with Dragon dictation along with smaller phrase technology. Any transcriptional errors that result from this process are unintentional.

## 2015-07-11 NOTE — Evaluation (Signed)
Physical Therapy Evaluation Patient Details Name: Anita Wilson MRN: [id number] DOB: [birth year] Today's Date: 07/11/2015   History of Present Illness  69 yo female with multiple falls at home, resulting in IM nailing R hip for transcervical fem neck fracture.  PMHx:  DM, CABG, breast CA, depression, a-fib  Clinical Impression  Pt is demonstrating some limits of tolerance for gait and movement but are more related to active LE use than ROM.  Her plan is to demonstrate some independent gait to be able to go home but cannot recommend first as she is going home alone.  Will assess her as she goes, may be able to get her there before discharge.    Follow Up Recommendations SNF    Equipment Recommendations  None recommended by PT (await SNF disposition)    Recommendations for Other Services Rehab consult     Precautions / Restrictions Precautions Precautions: Fall;Other (comment) (telemetry) Restrictions Weight Bearing Restrictions: Yes RLE Weight Bearing: Weight bearing as tolerated      Mobility  Bed Mobility Overal bed mobility: Needs Assistance Bed Mobility: Supine to Sit     Supine to sit: Mod assist;Min assist     General bed mobility comments: cued for rolling and asisted legs then light trunk assis to sit bedside  Transfers Overall transfer level: Needs assistance Equipment used: Rolling walker (2 wheeled) Transfers: Sit to/from [facility] Sit to Stand: Min assist Stand pivot transfers: Min guard;Min assist       General transfer comment: reminders about sequence and safety,   Ambulation/Gait Ambulation/Gait assistance: Min assist Ambulation Distance (Feet): 5 Feet Assistive device: Rolling walker (2 wheeled) Gait Pattern/deviations: Step-to pattern;Decreased stride length;Antalgic;Decreased weight shift to right Gait velocity: slow Gait velocity interpretation: Below normal speed for age/gender    Stairs             Wheelchair Mobility    Modified Rankin (Stroke Patients Only)       Balance Overall balance assessment: Needs assistance Sitting-balance support: Feet supported Sitting balance-Leahy Scale: Fair   Postural control: Posterior lean Standing balance support: Bilateral upper extremity supported Standing balance-Leahy Scale: Poor                               Pertinent Vitals/Pain Pain Assessment: Faces Pain Score: 6  Pain Location: R hip Pain Descriptors / Indicators: Aching;Spasm Pain Intervention(s): Limited activity within patient's tolerance;Monitored during session;Repositioned;RN gave pain meds during session    [place] expects to be discharged to:: Private residence Living Arrangements: Other relatives Available Help at Discharge: Family;Available PRN/intermittently Type of Home: House         Home Equipment: None      Prior Function Level of Independence: Independent               Hand Dominance        Extremity/Trunk Assessment   Upper Extremity Assessment: Overall WFL for tasks assessed           Lower Extremity Assessment: RLE deficits/detail RLE Deficits / Details: 2+ hip RLE    Cervical / Trunk Assessment: Normal  Communication   Communication: No difficulties (confusion)  Cognition Arousal/Alertness: Awake/alert Behavior During Therapy: Anxious Overall Cognitive Status: No family/caregiver present to determine baseline cognitive functioning       Memory: Decreased short-term memory;Decreased recall of precautions              General Comments General comments (skin  integrity, edema, etc.): Pt is getting up with trepidation, not appearing to know what to do.  Had mult falls and did not give PT a real history of why or what happened    Exercises        Assessment/Plan    PT Assessment Patient needs continued PT services  PT Diagnosis Acute pain;Difficulty walking   PT Problem List  Decreased strength;Decreased range of motion;Decreased activity tolerance;Decreased balance;Decreased mobility;Decreased cognition;Decreased coordination;Decreased knowledge of use of DME;Decreased safety awareness;Decreased knowledge of precautions;Cardiopulmonary status limiting activity;Decreased skin integrity;Pain  PT Treatment Interventions DME instruction;Gait training;Functional mobility training;Therapeutic activities;Therapeutic exercise;Balance training;Neuromuscular re-education;Cognitive remediation;Patient/family education   PT Goals (Current goals can be found in the Care Plan section) Acute Rehab PT Goals Patient Stated Goal: to go home PT Goal Formulation: With patient Time For Goal Achievement: 07/25/15 Potential to Achieve Goals: Good    Frequency 7X/week   Barriers to discharge Decreased caregiver support no consistent help at home    Co-evaluation               End of Session Equipment Utilized During Treatment: Gait belt Activity Tolerance: Patient tolerated treatment well;Patient limited by pain;Patient limited by fatigue Patient left: in chair;with call bell/phone within reach;with chair alarm set Nurse Communication: Mobility status         Time: [id number] PT Time Calculation (min) (ACUTE ONLY): 39 min   Charges:   PT Evaluation $Initial PT Evaluation Tier I: 1 Procedure PT Treatments $Gait Training: 8-22 mins $Therapeutic Activity: 8-22 mins   PT G Codes:        Ramond Dial 08/04/15, 10:36 AM   Mee Hives, PT MS Acute Rehab Dept. Number: [phone] [phone] and [place] [phone]

## 2015-07-11 NOTE — Progress Notes (Signed)
  Subjective: 1 Day Post-Op Procedure(s) (LRB): CANNULATED HIP PINNING (Right) Patient reports pain as moderate.   Patient is well, and has had no acute complaints or problems Plan is to go Home after hospital stay.  Will wait for PT evaluation  Negative for chest pain and shortness of breath Fever: no Gastrointestinal:Negative for nausea and vomiting  Objective: Vital signs in last 24 hours: Temp:  [97.6 F (36.4 C)-98.9 F (37.2 C)] 98.1 F (36.7 C) (11/17 0427) Pulse Rate:  [51-163] 92 (11/17 0427) Resp:  [14-20] 20 (11/17 0427) BP: (114-179)/(52-130) 161/72 mmHg (11/17 0427) SpO2:  [93 %-100 %] 99 % (11/17 0427)  Intake/Output from previous day:  Intake/Output Summary (Last 24 hours) at 07/11/15 0734 Last data filed at 07/11/15 0540  Gross per 24 hour  Intake    600 ml  Output   1885 ml  Net  -1285 ml    Intake/Output this shift:    Labs:  Recent Labs  07/10/15 0012 07/11/15 0611  HGB 11.1* 9.4*    Recent Labs  07/10/15 0012 07/11/15 0611  WBC 6.2 6.4  RBC 3.48* 2.98*  HCT 33.0* 28.4*  PLT 128* 131*    Recent Labs  07/10/15 0012 07/11/15 0611  NA 138 140  K 4.7 4.9  CL 110 112*  CO2 24 21*  BUN 45* 29*  CREATININE 1.52* 1.25*  GLUCOSE 163* 166*  CALCIUM 8.5* 8.0*    Recent Labs  07/10/15 0012  INR 1.16     EXAM General - Patient is Alert, Appropriate and Oriented Extremity - Neurologically intact ABD soft Sensation intact distally Intact pulses distally Dorsiflexion/Plantar flexion intact Incision: dressing C/D/I Dressing/Incision - clean, dry, no drainage Motor Function - intact, moving foot and toes well on exam.   Abdomen soft with no tympany, normal BS. Pt able to perform straight leg raise with moderate pain.  Past Medical History  Diagnosis Date  . Breast cancer ([place])   . Hypertension   . Diabetes mellitus without complication ([place])   . Allergy   . Depression   . COPD (chronic obstructive pulmonary disease) ([place])    . A-fib ([place])   . CAD (coronary artery disease)   . Chronic diastolic heart failure ([place])   . Pulmonary HTN ([place])   . CKD (chronic kidney disease), stage III     Assessment/Plan: 1 Day Post-Op Procedure(s) (LRB): CANNULATED HIP PINNING (Right) Principal Problem:   Fracture of femoral neck, right (HCC) Active Problems:   Type 2 diabetes mellitus (HCC)   GERD (gastroesophageal reflux disease)   COPD (chronic obstructive pulmonary disease) (HCC)   CAD (coronary artery disease)   HTN (hypertension)   Chronic diastolic CHF (congestive heart failure) (HCC)   CKD (chronic kidney disease), stage III   A-fib (HCC)  Estimated body mass index is 20.76 kg/(m^2) as calculated from the following:   Height as of this encounter: 5\' 4"  (1.626 m).   Weight as of this encounter: 54.885 kg (121 lb). Advance diet Up with therapy   Labs reviewed, Cr and BUN improving, will continue IVF.  Hg 9.4.  CBC and BMP ordered for tomorrow morning. Pt has not had a BM. Pt on 2L of O2, SpO2 99%, pt has history of COPD.  DVT Prophylaxis - Lovenox, Foot Pumps and TED hose Weight-Bearing as tolerated to right leg  J. Cameron Proud, PA-C [facility] Orthopaedic Surgery 07/11/2015, 7:34 AM

## 2015-07-11 NOTE — Progress Notes (Signed)
Clinical Education officer, museum (CSW) presented bed offers to patient and her sister Bethena Roys. Per patient she would like to discuss offers with daughter. CSW will continue to follow and assist as needed.   Blima Rich, [place] [phone]

## 2015-07-11 NOTE — Progress Notes (Signed)
Pt eating and drinking.diabetic with ivf withh dextrose. Spoke with dr Buck Mam who ordered d/c ivf and chg ssi to ac and hs

## 2015-07-11 NOTE — Progress Notes (Signed)
Clinical Social Worker (CSW) met with patient to get bed choice. Patient chose [facility]. Doug admissions coordinator at [facility] is aware of accepted offer. Plan is for patient to D/C to [facility] Saturday. MD will have to complete D/C Summary Friday. CSW will continue to follow and assist as needed.   Blima Rich, [place] [phone]

## 2015-07-11 NOTE — Progress Notes (Signed)
Physical Therapy Treatment Patient Details Name: Anita Wilson MRN: [id number] DOB: August 11, 1946 Today's Date: 07/11/2015    History of Present Illness 69 yo female with multiple falls at home, resulting in IM nailing R hip for transcervical fem neck fracture.  PMHx:  DM, CABG, breast CA, depression, a-fib    PT Comments    Pt with increased tolerance to activity this afternoon.  Demonstrated increased anxiety indicating 10/10 pain to initiate therapy however decreased as session progressed with pt indicating decreased pain at end of session.  Required cues for transfers for PHP to increase safety.  Pt ambulated approx 63ft with RW and CGA.  Pt then performed seated and supine therex as noted. She would con't to benefit from skilled PT to increase LE strength and functional activity tolerance to increase safety with ambulation and transfers.     Follow Up Recommendations  SNF     Equipment Recommendations  None recommended by PT (await SNF disposition)    Recommendations for Other Services Rehab consult     Precautions / Restrictions Precautions Precautions: Fall;Other (comment) (telemetry) Restrictions Weight Bearing Restrictions: Yes RLE Weight Bearing: Weight bearing as tolerated    Mobility  Bed Mobility Overal bed mobility: Needs Assistance Bed Mobility: Sit to Supine     Supine to sit: Mod assist (Assist to lift RLE)     General bed mobility comments: Once in bed pt demostrated mod I in repositioning for comfort  Transfers Overall transfer level: Needs assistance Equipment used: Rolling walker (2 wheeled) Transfers: Sit to/from Stand Sit to Stand: Min assist         General transfer comment: Pt required cues for PHP with transfers for safety  Ambulation/Gait Ambulation/Gait assistance: Min assist Ambulation Distance (Feet): 20 Feet Assistive device: Rolling walker (2 wheeled) Gait Pattern/deviations: Step-to pattern Gait velocity: slow Gait  velocity interpretation: Below normal speed for age/gender     Stairs            Wheelchair Mobility    Modified Rankin (Stroke Patients Only)       Balance Overall balance assessment: Needs assistance Sitting-balance support: Feet supported Sitting balance-Leahy Scale: Fair   Postural control: Posterior lean Standing balance support: Bilateral upper extremity supported Standing balance-Leahy Scale: Poor                      Cognition Arousal/Alertness: Awake/alert Behavior During Therapy: Anxious Overall Cognitive Status: No family/caregiver present to determine baseline cognitive functioning       Memory: Decreased short-term memory;Decreased recall of precautions              Exercises Other Exercises Other Exercises: Pt performed seated LAQ, supine heel slides, AA SLR, QS, hip abd x 10     General Comments General comments (skin integrity, edema, etc.): Pt with increased anxiety upon beginning therapy, decreased as session progressed.        Pertinent Vitals/Pain Pain Assessment: 0-10 Pain Score: 10-Worst pain ever Pain Location: R hip Pain Descriptors / Indicators: Aching Pain Intervention(s): Limited activity within patient's tolerance;Monitored during session;Patient requesting pain meds-RN notified    Home Living                      Prior Function            PT Goals (current goals can now be found in the care plan section) Acute Rehab PT Goals Patient Stated Goal: to go home PT Goal Formulation: With patient Time  For Goal Achievement: 07/25/15 Potential to Achieve Goals: Good    Frequency  7X/week    PT Plan      Co-evaluation             End of Session Equipment Utilized During Treatment: Gait belt Activity Tolerance: Patient tolerated treatment well;Patient limited by pain;Patient limited by fatigue Patient left: in bed;with call bell/phone within reach;with bed alarm set;with family/visitor present      Time: 1455-1520 PT Time Calculation (min) (ACUTE ONLY): 25 min  Charges:  $Gait Training: 8-22 mins $Therapeutic Exercise: 8-22 mins                    G Codes:      Treyvion Durkee 13-Jul-2015, 3:41 PM Klye Besecker, PTA

## 2015-07-12 ENCOUNTER — Encounter: Payer: Self-pay | Admitting: Surgery

## 2015-07-12 LAB — BASIC METABOLIC PANEL
Anion gap: 7 (ref 5–15)
Anion gap: 4 — ABNORMAL LOW (ref 5–15)
BUN: 26 mg/dL — AB (ref 6–20)
BUN: 25 mg/dL — AB (ref 6–20)
CALCIUM: 8.6 mg/dL — AB (ref 8.9–10.3)
CALCIUM: 8.7 mg/dL — AB (ref 8.9–10.3)
CO2: 23 mmol/L (ref 22–32)
CO2: 25 mmol/L (ref 22–32)
CREATININE: 1.18 mg/dL — AB (ref 0.44–1.00)
CREATININE: 1.13 mg/dL — AB (ref 0.44–1.00)
Chloride: 107 mmol/L (ref 101–111)
Chloride: 107 mmol/L (ref 101–111)
GFR calc Af Amer: 54 mL/min — ABNORMAL LOW (ref >60)
GFR calc Af Amer: 57 mL/min — ABNORMAL LOW (ref >60)
GFR calc non Af Amer: 49 mL/min — ABNORMAL LOW (ref >60)
GFR, EST NON AFRICAN AMERICAN: 46 mL/min — AB (ref >60)
GLUCOSE: 185 mg/dL — AB (ref 65–99)
GLUCOSE: 191 mg/dL — AB (ref 65–99)
POTASSIUM: 5.9 mmol/L — AB (ref 3.5–5.1)
Potassium: 5.5 mmol/L — ABNORMAL HIGH (ref 3.5–5.1)
SODIUM: 137 mmol/L (ref 135–145)
Sodium: 136 mmol/L (ref 135–145)

## 2015-07-12 LAB — URINE CULTURE: SPECIAL REQUESTS: NORMAL

## 2015-07-12 LAB — CBC
HCT: 31.1 % — ABNORMAL LOW (ref 35.0–47.0)
Hemoglobin: 10.5 g/dL — ABNORMAL LOW (ref 12.0–16.0)
MCH: 32.1 pg (ref 26.0–34.0)
MCHC: 33.6 g/dL (ref 32.0–36.0)
MCV: 95.4 fL (ref 80.0–100.0)
PLATELETS: 154 10*3/uL (ref 150–440)
RBC: 3.26 MIL/uL — ABNORMAL LOW (ref 3.80–5.20)
RDW: 13.5 % (ref 11.5–14.5)
WBC: 7.8 10*3/uL (ref 3.6–11.0)

## 2015-07-12 LAB — GLUCOSE, CAPILLARY
GLUCOSE-CAPILLARY: 182 mg/dL — AB (ref 65–99)
GLUCOSE-CAPILLARY: 163 mg/dL — AB (ref 65–99)
Glucose-Capillary: 154 mg/dL — ABNORMAL HIGH (ref 65–99)
Glucose-Capillary: 197 mg/dL — ABNORMAL HIGH (ref 65–99)
Glucose-Capillary: 183 mg/dL — ABNORMAL HIGH (ref 65–99)

## 2015-07-12 LAB — POTASSIUM: POTASSIUM: 5.6 mmol/L — AB (ref 3.5–5.1)

## 2015-07-12 MED ORDER — ENOXAPARIN SODIUM 40 MG/0.4ML ~~LOC~~ SOLN: 40.0000 mg | SUBCUTANEOUS | Status: DC

## 2015-07-12 MED ORDER — CEPHALEXIN 500 MG PO CAPS
500.0000 mg | ORAL_CAPSULE | Freq: Two times a day (BID) | ORAL | Status: DC
  Administered 2015-07-12 – 2015-07-13 (×2): 500 mg via ORAL
  Filled 2015-07-12 (×2): qty 1

## 2015-07-12 MED ORDER — CEPHALEXIN 500 MG PO CAPS: 500.0000 mg | ORAL_CAPSULE | Freq: Two times a day (BID) | ORAL | Status: AC

## 2015-07-12 MED ORDER — HYDRALAZINE HCL 10 MG PO TABS
10.0000 mg | ORAL_TABLET | Freq: Three times a day (TID) | ORAL | Status: DC
  Administered 2015-07-12 – 2015-07-13 (×2): 10 mg via ORAL
  Filled 2015-07-12 (×5): qty 1

## 2015-07-12 MED ORDER — FERROUS SULFATE 325 (65 FE) MG PO TABS: 325.0000 mg | ORAL_TABLET | Freq: Three times a day (TID) | ORAL | Status: DC

## 2015-07-12 MED ORDER — OXYCODONE-ACETAMINOPHEN 5-325 MG PO TABS: 1.0000 | ORAL_TABLET | ORAL | Status: DC | PRN

## 2015-07-12 MED ORDER — SODIUM POLYSTYRENE SULFONATE 15 GM/60ML PO SUSP
15.0000 g | Freq: Once | ORAL | Status: AC
Start: 2015-07-12 — End: 2015-07-12
  Administered 2015-07-12: 15 g via ORAL
  Filled 2015-07-12: qty 60

## 2015-07-12 NOTE — Progress Notes (Signed)
  Subjective: 2 Days Post-Op Procedure(s) (LRB): CANNULATED HIP PINNING (Right) Patient reports pain as mild.   Patient is aggitated this morning and confused. Plan is to go Skilled nursing facility after hospital stay. Negative for chest pain and shortness of breath Fever: no Gastrointestinal:Negative for nausea and vomiting  Objective: Vital signs in last 24 hours: Temp:  [98 F (36.7 C)-98.8 F (37.1 C)] 98.1 F (36.7 C) (11/18 0421) Pulse Rate:  [76-101] 100 (11/18 0423) Resp:  [18] 18 (11/18 0421) BP: (124-184)/(50-82) 162/82 mmHg (11/18 0451) SpO2:  [89 %-97 %] 92 % (11/18 0423)  Intake/Output from previous day:  Intake/Output Summary (Last 24 hours) at 07/12/15 0732 Last data filed at 07/12/15 0451  Gross per 24 hour  Intake    240 ml  Output    350 ml  Net   -110 ml    Intake/Output this shift:    Labs:  Recent Labs  07/10/15 0012 07/11/15 0611 07/12/15 0528  HGB 11.1* 9.4* 10.5*    Recent Labs  07/11/15 0611 07/12/15 0528  WBC 6.4 7.8  RBC 2.98* 3.26*  HCT 28.4* 31.1*  PLT 131* 154    Recent Labs  07/11/15 0611 07/12/15 0528  NA 140 137  K 4.9 5.9*  CL 112* 107  CO2 21* 23  BUN 29* 26*  CREATININE 1.25* 1.18*  GLUCOSE 166* 185*  CALCIUM 8.0* 8.6*    Recent Labs  07/10/15 0012  INR 1.16     EXAM General - Patient is Alert and Confused Extremity - Neurologically intact ABD soft Dorsiflexion/Plantar flexion intact Incision: dressing C/D/I No cellulitis present Dressing/Incision - clean, dry, no drainage Motor Function - intact, moving foot and toes well on exam.   Past Medical History  Diagnosis Date  . Breast cancer ([place])   . Hypertension   . Diabetes mellitus without complication ([place])   . Allergy   . Depression   . COPD (chronic obstructive pulmonary disease) ([place])   . A-fib ([place])   . CAD (coronary artery disease)   . Chronic diastolic heart failure ([place])   . Pulmonary HTN ([place])   . CKD (chronic kidney disease),  stage III     Assessment/Plan: 2 Days Post-Op Procedure(s) (LRB): CANNULATED HIP PINNING (Right) Principal Problem:   Fracture of femoral neck, right (HCC) Active Problems:   Type 2 diabetes mellitus (HCC)   GERD (gastroesophageal reflux disease)   COPD (chronic obstructive pulmonary disease) (HCC)   CAD (coronary artery disease)   HTN (hypertension)   Chronic diastolic CHF (congestive heart failure) (HCC)   CKD (chronic kidney disease), stage III   A-fib (HCC)  Estimated body mass index is 20.76 kg/(m^2) as calculated from the following:   Height as of this encounter: 5\' 4"  (1.626 m).   Weight as of this encounter: 54.885 kg (121 lb). Advance diet Up with therapy   Pt is confused and agitated this morning.  She had to be reminded that she had undergone surgery two days prior. Pt is tachycardic this AM, but in a normal rhythm. K+ 5.9 this AM.  Will order Stat potassium.  Instructed Nurse to contact internal medicine to make them aware of the potassium level. Pt states that she has had a BM since the surgery.  Plan on d/c potentially tomorrow pending medical condition.  DVT Prophylaxis - Lovenox, Foot Pumps and TED hose Weight-Bearing as tolerated to right leg  J. Cameron Proud, PA-C [facility] Orthopaedic Surgery 07/12/2015, 7:32 AM

## 2015-07-12 NOTE — Progress Notes (Signed)
Plan is for patient to D/C to [facility]. Per [facility] admissions coordinator at [facility] patient is going to room 505. Clinical Education officer, museum (CSW) sent D/C summary today. CSW met with patient and her sister Bethena Roys and made then aware of above. CSW answered all patient's questions. CSW will continue to follow and assist as needed.   Blima Rich, [place] [phone]

## 2015-07-12 NOTE — Discharge Summary (Addendum)
[facility] [place]   PATIENT NAME: Anita Wilson    MR#:  [id number]  DATE OF BIRTH:  Jan 17, 1946  DATE OF ADMISSION:  07/09/2015 ADMITTING PHYSICIAN: Lance Coon, MD  DATE OF DISCHARGE: 07/13/2015  PRIMARY CARE PHYSICIAN: Alessandra Grout, MD    ADMISSION DIAGNOSIS:  Femoral neck fracture, right, closed, initial encounter [S72.001A]  DISCHARGE DIAGNOSIS:  Principal Problem:   Fracture of femoral neck, right (HCC) Active Problems:   Type 2 diabetes mellitus (HCC)   GERD (gastroesophageal reflux disease)   COPD (chronic obstructive pulmonary disease) (HCC)   CAD (coronary artery disease)   HTN (hypertension)   Chronic diastolic CHF (congestive heart failure) (HCC)   CKD (chronic kidney disease), stage III   A-fib ([place])   SECONDARY DIAGNOSIS:   Past Medical History  Diagnosis Date  . Breast cancer ([place])   . Hypertension   . Diabetes mellitus without complication ([place])   . Allergy   . Depression   . COPD (chronic obstructive pulmonary disease) ([place])   . A-fib ([place])   . CAD (coronary artery disease)   . Chronic diastolic heart failure ([place])   . Pulmonary HTN ([place])   . CKD (chronic kidney disease), stage III     HOSPITAL COURSE:   #1 right hip fracture nondisplaced initial encounter:  Status post ORIF on 11/16. Doing well.  Continue with PT, WBAT RLE Discharge to rehab today Follow up with [facility] in 6 weeks for xray of right hip Remove staples and apply steri strips on 07/24/15 DVT prophylaxys with lovenox for 14 days per orthopedics protocol.  2. UTI follow urine cultures, UA was abnormal on admission.  Urine culture shows Klebsiella. Keflex PO on dc x 7 days stop date 07/18/15  #3 history of COPD: No wheezing. Continue Spiriva, albuterol.  #4 hypertension continue home medications   5. acute renal failure secondary to ATN; Renal function improved with hydration. She has been  hyperkalemic during admission and has a history of the same. Treated with kayexelate.  Will need repeat BMP on 11/21. Would avoid supplements such as glucernal. If needed give a renal preparation.  #6 history of coronary artery disease status post CABG: Continue Imdur, Coreg, aspirin,statins. No chest pain during admission.  #7 diabetes mellitus: A1C 5.9. CBG's well controlled here on SSI at low dose. Will restart lower dose lantus on dc to SNF. She does need glucose monitoring and adjustment.  Can resume glipizide at 10 mg BID, but needs tid/hs cbg checks.  DISCHARGE CONDITIONS:   stable  CONSULTS OBTAINED:  Treatment Team:  Dereck Leep, MD Corky Mull, MD  DRUG ALLERGIES:   Allergies  Allergen Reactions  . Bee Venom Shortness Of Breath and Swelling  . Diphenhydramine     Other reaction(s): Other (See Comments) "took tylenol pm and ended up in hospital with something wrong with my heart"  . Morphine     Other reaction(s): Other (See Comments) hallucination  . Phenylalanine Diarrhea    "upset stomach"  . Sulfa Antibiotics     Other reaction(s): Other (See Comments) "metalic smell and taste"    DISCHARGE MEDICATIONS:   Current Discharge Medication List    START taking these medications   Details  bisacodyl (DULCOLAX) 10 MG suppository Place 1 suppository (10 mg total) rectally daily as needed for moderate constipation. Qty: 12 suppository, Refills: 0    cephALEXin (KEFLEX) 500 MG capsule Take 1 capsule (500 mg total) by  mouth 2 (two) times daily. Qty: 10 capsule, Refills: 0    enoxaparin (LOVENOX) 40 MG/0.4ML injection Inject 0.4 mLs (40 mg total) into the skin daily. Qty: 11 Syringe, Refills: 0    ferrous sulfate 325 (65 FE) MG tablet Take 1 tablet (325 mg total) by mouth 3 (three) times daily after meals. Qty: 90 tablet, Refills: 3      CONTINUE these medications which have CHANGED   Details  insulin glargine (LANTUS) 100 UNIT/ML injection Inject 0.1 mLs (10  Units total) into the skin at bedtime. Qty: 10 mL, Refills: 11    oxyCODONE-acetaminophen (ROXICET) 5-325 MG tablet Take 1-2 tablets by mouth every 4 (four) hours as needed for severe pain. Qty: 15 tablet, Refills: 0      CONTINUE these medications which have NOT CHANGED   Details  alendronate (FOSAMAX) 70 MG tablet Take 70 mg by mouth once a week.     aspirin EC 81 MG tablet Take 81 mg by mouth daily.     atorvastatin (LIPITOR) 40 MG tablet Take 40 mg by mouth daily at 6 PM.     carvedilol (COREG) 25 MG tablet Take 25 mg by mouth 2 (two) times daily with a meal.     citalopram (CELEXA) 10 MG tablet Take 10 mg by mouth daily.    CLONIDINE HCL PO Take 0.1 mg by mouth at bedtime.     EPINEPHrine (EPIPEN JR) 0.15 MG/0.3ML injection Inject 0.15 mg into the skin.    ferrous sulfate 325 (65 FE) MG EC tablet Take 325 mg by mouth daily with breakfast.     fluticasone (FLONASE) 50 MCG/ACT nasal spray Use 2 sprays in each  nostril daily    glipiZIDE (GLUCOTROL) 10 MG tablet Take 10 mg by mouth 2 (two) times daily before a meal.     glucose blood (ONE TOUCH ULTRA TEST) test strip     hydrALAZINE (APRESOLINE) 10 MG tablet Take 10 mg by mouth 3 (three) times daily.     Insulin Syringe-Needle U-100 (INSULIN SYRINGE .3CC/31GX5/16") 31G X 5/16" 0.3 ML MISC     isosorbide mononitrate (IMDUR) 30 MG 24 hr tablet Take 30 mg by mouth daily.     letrozole (FEMARA) 2.5 MG tablet Take 2.5 mg by mouth daily.    Multiple Vitamin (MULTI-VITAMINS) TABS Take 1 tablet by mouth daily.     omeprazole (PRILOSEC) 20 MG capsule Take 20 mg by mouth 2 (two) times daily before a meal.     tiotropium (SPIRIVA HANDIHALER) 18 MCG inhalation capsule Place into inhaler and inhale.         DISCHARGE INSTRUCTIONS:    DIET:  Cardiac diet  DISCHARGE CONDITION:  Fair  ACTIVITY:  Activity as tolerated  OXYGEN:  Home Oxygen: No.   Oxygen Delivery: room air  DISCHARGE LOCATION:  nursing home   If  you experience worsening of your admission symptoms, develop shortness of breath, life threatening emergency, suicidal or homicidal thoughts you must seek medical attention immediately by calling 911 or calling your MD immediately  if symptoms less severe.  You Must read complete instructions/literature along with all the possible adverse reactions/side effects for all the Medicines you take and that have been prescribed to you. Take any new Medicines after you have completely understood and accpet all the possible adverse reactions/side effects.   Please note  You were cared for by a hospitalist during your hospital stay. If you have any questions about your discharge medications or the care you  received while you were in the hospital after you are discharged, you can call the unit and asked to speak with the hospitalist on call if the hospitalist that took care of you is not available. Once you are discharged, your primary care physician will handle any further medical issues. Please note that NO REFILLS for any discharge medications will be authorized once you are discharged, as it is imperative that you return to your primary care physician (or establish a relationship with a primary care physician if you do not have one) for your aftercare needs so that they can reassess your need for medications and monitor your lab values.    Today   CHIEF COMPLAINT:   Chief Complaint  Patient presents with  . Leg Pain    HISTORY OF PRESENT ILLNESS:   Anita Wilson is a 70 y.o. female who presents with right hip fracture. Patient states that she fell initially last Thursday, and came to the ED for evaluation but was told she did not fracture her hip. She then had another fall on the day of presentation to the ED a second time. This time x-ray does show right hip fracture. Orthopedics follow from the ED, will see in the morning. Hospitalists called for admission.  VITAL SIGNS:  Blood pressure  179/94, pulse 62, temperature 98 F (36.7 C), temperature source Oral, resp. rate 18, height 5\' 4"  (1.626 m), weight 54.885 kg (121 lb), SpO2 92 %.  I/O:    Intake/Output Summary (Last 24 hours) at 07/13/15 1204 Last data filed at 07/13/15 0900  Gross per 24 hour  Intake    120 ml  Output      0 ml  Net    120 ml    PHYSICAL EXAMINATION:  GENERAL:  69 y.o.-year-old patientsitting in chair with no acute distress.  LUNGS: Normal breath sounds bilaterally, no wheezing, rales,rhonchi or crepitation. No use of accessory muscles of respiration.  CARDIOVASCULAR: S1, S2 normal. No murmurs, rubs, or gallops.  ABDOMEN: Soft, non-tender, non-distended. Bowel sounds present. No organomegaly or mass.  EXTREMITIES: No pedal edema, cyanosis, or clubbing.  NEUROLOGIC: Cranial nerves II through XII are intact. Muscle strength 5/5 in all extremities. Sensation intact. Gait not checked.  PSYCHIATRIC: The patient is alert and oriented x 3.  SKIN: No obvious rash, lesion, or ulcer.   DATA REVIEW:   CBC  Recent Labs Lab 07/13/15 0429  WBC 7.5  HGB 10.4*  HCT 30.6*  PLT 163    Chemistries   Recent Labs Lab 07/13/15 0429  NA 139  K 4.6  CL 105  CO2 24  GLUCOSE 241*  BUN 23*  CREATININE 1.17*  CALCIUM 8.6*    Cardiac Enzymes No results for input(s): TROPONINI in the last 168 hours.  Microbiology Results  Results for orders placed or performed during the hospital encounter of 07/09/15  Urine culture     Status: None   Collection Time: 07/10/15  2:43 AM  Result Value Ref Range Status   Specimen Description Urine  Final   Special Requests URINE  Final   Culture >=100,000 COLONIES/mL KLEBSIELLA PNEUMONIAE  Final   Report Status 07/12/2015 FINAL  Final   Organism ID, Bacteria KLEBSIELLA PNEUMONIAE  Final      Susceptibility   Klebsiella pneumoniae - MIC*    AMPICILLIN <=2 RESISTANT Resistant     CEFTAZIDIME <=1 SENSITIVE Sensitive     CEFAZOLIN <=4 SENSITIVE Sensitive      CEFTRIAXONE <=1 SENSITIVE Sensitive  CIPROFLOXACIN <=0.25 SENSITIVE Sensitive     GENTAMICIN <=1 SENSITIVE Sensitive     IMIPENEM <=0.25 SENSITIVE Sensitive     TRIMETH/SULFA <=20 SENSITIVE Sensitive     PIP/TAZO Value in next row Sensitive      SENSITIVE<=4    * >=100,000 COLONIES/mL KLEBSIELLA PNEUMONIAE  MRSA PCR Screening     Status: None   Collection Time: 07/10/15  5:47 AM  Result Value Ref Range Status   MRSA by PCR NEGATIVE NEGATIVE Final    Comment:        The GeneXpert MRSA Assay (FDA approved for NASAL specimens only), is one component of a comprehensive MRSA colonization surveillance program. It is not intended to diagnose MRSA infection nor to guide or monitor treatment for MRSA infections.   Urine culture     Status: None   Collection Time: 07/10/15  2:15 PM  Result Value Ref Range Status   Specimen Description URINE, RANDOM  Final   Special Requests Normal  Final   Culture >=100,000 COLONIES/mL KLEBSIELLA PNEUMONIAE  Final   Report Status 07/12/2015 FINAL  Final   Organism ID, Bacteria KLEBSIELLA PNEUMONIAE  Final      Susceptibility   Klebsiella pneumoniae - MIC*    AMPICILLIN <=2 RESISTANT Resistant     CEFTAZIDIME <=1 SENSITIVE Sensitive     CEFAZOLIN <=4 SENSITIVE Sensitive     CEFTRIAXONE <=1 SENSITIVE Sensitive     CIPROFLOXACIN <=0.25 SENSITIVE Sensitive     GENTAMICIN <=1 SENSITIVE Sensitive     IMIPENEM <=0.25 SENSITIVE Sensitive     TRIMETH/SULFA <=20 SENSITIVE Sensitive     PIP/TAZO Value in next row Sensitive      SENSITIVE<=4    * >=100,000 COLONIES/mL KLEBSIELLA PNEUMONIAE    RADIOLOGY:  No results found.  EKG:   Orders placed or performed in visit on 06/18/13  . EKG 12-Lead      Management plans discussed with the patient, family and they are in agreement.  CODE STATUS:     Code Status Orders        Start     Ordered   07/10/15 1905  Full code   Continuous     07/10/15 1904      TOTAL TIME TAKING CARE OF THIS  PATIENT: 40 minutes.  Greater than 50% of time spent in care coordination and counseling.  Myrtis Ser M.D on 07/13/2015 at 12:04 PM  Between 7am to 6pm - Pager - [phone]  After 6pm go to [email or web address] - password [email or web address] [facility]  Office  [phone]  CC: Primary care physician; Alessandra Grout, MD  This discharge summary was started on 11/18 in anticipation of discharge on that date. The patient did not leave [place] until 11/19. The discharge summary was updated and I saw the patient on 11/19 prior to her actual discharge.

## 2015-07-12 NOTE — Progress Notes (Signed)
Physical Therapy Treatment Patient Details Name: Anita Wilson MRN: [id number] DOB: [birth year] Today's Date: 07/12/2015    History of Present Illness 69 yo female with multiple falls at home, resulting in IM nailing R hip for fem neck fracture.  PMHx:  DM, CABG, breast CA, depression, a-fib    PT Comments    Pt demonstrates improved bed mobility, transfers, and ambulatory tolerance. Patient continues to demonstrate poor WB through RLE (unable to bring heel down). She is rather impulsive during this session, particularly with turning with RW to get on to [facility] and return to bed (poor and unsafe mechanics noted with both). Collectively she is making strides towards her mobility goals, though she will benefit from increased emphasis on WB through RLE (perhaps trying a knee immobilizer) and increased safety cues (2nd bout of ambulation much less impulsive after discussion with PT).   Follow Up Recommendations  SNF     Equipment Recommendations       Recommendations for Other Services       Precautions / Restrictions Precautions Precautions: Fall;Other (comment) (telemetry) Restrictions Weight Bearing Restrictions: Yes RLE Weight Bearing: Weight bearing as tolerated    Mobility  Bed Mobility Overal bed mobility: Needs Assistance Bed Mobility: Supine to Sit;Sit to Supine     Supine to sit: Min assist;Mod assist Sit to supine: Min assist   General bed mobility comments: Pt demonstrates good control of torso with UEs, however she struggles to bring her RLE over the edge of the bed secondary to weakness/pain.   Transfers Overall transfer level: Needs assistance Equipment used: Rolling walker (2 wheeled) Transfers: Sit to/from Stand Sit to Stand: Min guard Stand pivot transfers: Min guard;Min assist       General transfer comment: Patient performs transfers impulsively and requires more than one attempt to complete. He relies primarily on her UEs and leans forward  significantly to bring her shoulders over her wrists to give mechanical advantage to triceps.   Ambulation/Gait Ambulation/Gait assistance: Min guard Ambulation Distance (Feet): 60 Feet (30' for 2 rounds) Assistive device: Rolling walker (2 wheeled) Gait Pattern/deviations: Step-to pattern;Step-through pattern;Decreased weight shift to right;Decreased stance time - right;Antalgic   Gait velocity interpretation: <1.8 ft/sec, indicative of risk for recurrent falls General Gait Details: Pt prefers to WB through her RLE through her toes, she does not bring into foot flat as this hurts. Patient is impulsive in gait and requires ample cuing to establish a rhythm and order to ambulation as she impulsively attempts to get back to L sided WB as quickly as possible. She takes several steps via hopping as well.    Stairs            Wheelchair Mobility    Modified Rankin (Stroke Patients Only)       Balance Overall balance assessment: Needs assistance Sitting-balance support: No upper extremity supported;Feet unsupported Sitting balance-Leahy Scale: Good Sitting balance - Comments: No balance deficits.    Standing balance support: Bilateral upper extremity supported Standing balance-Leahy Scale: Fair                      Cognition Arousal/Alertness: Awake/alert Behavior During Therapy: WFL for tasks assessed/performed;Impulsive Overall Cognitive Status: Within Functional Limits for tasks assessed                      Exercises General Exercises - Lower Extremity Long Arc Quad: AROM;15 reps;Both Hip Flexion/Marching: AROM;Both;15 reps Other Exercises Other Exercises: Patient ambulated to restroom, impulsively  attempted to cross threshold and sit on commode with walker inappropriately placed (excessively anterior to her).  Other Exercises: Toilet transfer, SPT without RW, cga/min assist--patient impulsively initiating transfer prior to cuing from therapist,  expressing sense of urgency.  Sit/stand from [facility], cga with RW.  Standing balance for clothing management with RW, min assist (R lateral LOB x1)    General Comments General comments (skin integrity, edema, etc.): Mild bruising noted in gluteal region and anterior thigh, likely from falls.       Pertinent Vitals/Pain Pain Assessment: Faces Pain Score: 8  Faces Pain Scale: Hurts little more Pain Location: R anterior thigh Pain Descriptors / Indicators: Aching Pain Intervention(s): Limited activity within patient's tolerance;Monitored during session;Premedicated before session;Ice applied    Home Living                      Prior Function            PT Goals (current goals can now be found in the care plan section) Acute Rehab PT Goals Patient Stated Goal: to go home PT Goal Formulation: With patient Time For Goal Achievement: 07/25/15 Potential to Achieve Goals: Good Progress towards PT goals: Progressing toward goals    Frequency  BID    PT Plan Current plan remains appropriate    Co-evaluation             End of Session Equipment Utilized During Treatment: Gait belt Activity Tolerance: Patient tolerated treatment well;Patient limited by fatigue Patient left: in bed;with call bell/phone within reach;with bed alarm set     Time: 1416-1443 PT Time Calculation (min) (ACUTE ONLY): 27 min  Charges:  $Gait Training: 8-22 mins $Therapeutic Activity: 8-22 mins                    G Codes:      Kerman Passey, PT, DPT    07/12/2015, 3:11 PM

## 2015-07-12 NOTE — Progress Notes (Signed)
Pharmacy Antibiotic Time-Out Note  Anita Wilson is a 69 y.o. year-old female admitted on 07/09/2015.  The patient is currently on ceftriaxone for UTI.  Assessment/Plan: After discussion with Dr. Volanda Napoleon, the current antibiotic, ceftriaxone will be narrowed to cephalexin in anticipation of discharge.  Temp (24hrs), Avg:98.3 F (36.8 C), Min:98 F (36.7 C), Max:98.8 F (37.1 C)   Recent Labs Lab 07/10/15 0012 07/11/15 0611 07/12/15 0528  WBC 6.2 6.4 7.8    Recent Labs Lab 07/10/15 0012 07/11/15 0611 07/12/15 0528 07/12/15 1234  CREATININE 1.52* 1.25* 1.18* 1.13*   Estimated Creatinine Clearance: 41.1 mL/min (by C-G formula based on Cr of 1.13).   Antimicrobial allergies: sulfa antibiotics  Antimicrobials this admission: ceftriaxone 11/16 >> 11/18 Cephalexin 11/18 >>   Microbiology Results: 11/16 UCx: klebsiella - sensitive to ancef, ceftriaxone, cipro    Thank you for allowing pharmacy to be a part of this patient's care.  Rexene Edison, PharmD Clinical Pharmacist  07/12/2015 4:21 PM

## 2015-07-12 NOTE — Progress Notes (Signed)
[facility] [place] NAME: Berdene Wilson    MR#:  [id number]  DATE OF BIRTH:  09/05/1945  SUBJECTIVE: 69 yr old  female patient with history of hypertension, diabetes mellitus type 2, COPD admitted for right hip pain secondary to fall. Noted to have right femoral neck fracture. It is post right ORIF 11/16   CHIEF COManPLAINT:   Chief Complaint  Patient presents with  . Leg Pain   Uncomfortable today pain in right hip.  REVIEW OF SYSTEMS:   Review of Systems  Musculoskeletal:       Complains of pain in the right hip..   CONSTITUTIONAL: No fever, fatigue or weakness.  EYES: No blurred or double vision.  EARS, NOSE, AND THROAT: No tinnitus or ear pain.  RESPIRATORY: No cough, shortness of breath, wheezing or hemoptysis.  CARDIOVASCULAR: No chest pain, orthopnea, edema.  GASTROINTESTINAL: No nausea, vomiting, diarrhea or abdominal pain.  GENITOURINARY: No dysuria, hematuria.  ENDOCRINE: No polyuria, nocturia,  HEMATOLOGY: No anemia, easy bruising or bleeding SKIN: No rash or lesion. MUSCULOSKEL. Right hip pain. No tingling, numbness, weakness.  PSYCHIATRY: No anxiety or depression.   DRUG ALLERGIES:   Allergies  Allergen Reactions  . Bee Venom Shortness Of Breath and Swelling  . Diphenhydramine     Other reaction(s): Other (See Comments) "took tylenol pm and ended up in hospital with something wrong with my heart"  . Morphine     Other reaction(s): Other (See Comments) hallucination  . Phenylalanine Diarrhea    "upset stomach"  . Sulfa Antibiotics     Other reaction(s): Other (See Comments) "metalic smell and taste"    VITALS:  Blood pressure 158/86, pulse 88, temperature 98.2 F (36.8 C), temperature source Oral, resp. rate 18, height 5\' 4"  (1.626 m), weight 54.885 kg (121 lb), SpO2 94 %.  PHYSICAL EXAMINATION:  GENERAL:  69 y.o.-year-old patient lying in the bed with no acute distress.  EYES: Pupils equal,  round, reactive to light and accommodation. No scleral icterus. Extraocular muscles intact.  HEENT: Head atraumatic, normocephalic. Oropharynx and nasopharynx clear.  NECK:  Supple, no jugular venous distention. No thyroid enlargement, no tenderness.  LUNGS: Normal breath sounds bilaterally, no wheezing, rales,rhonchi or crepitation. No use of accessory muscles of respiration.  CARDIOVASCULAR: S1, S2 normal. No murmurs, rubs, or gallops.  ABDOMEN: Soft, nontender, nondistended. Bowel sounds present. No organomegaly or mass.  EXTREMITIES:  status post surgery, dressing present in the right hip. NEUROLOGIC: Cranial nerves II through XII are intact. Muscle strength 5/5 in all extremities. Sensation intact. Gait not checked.  PSYCHIATRIC: The patient is alert and oriented x 3.  SKIN: No obvious rash, lesion, or ulcer.    LABORATORY PANEL:   CBC  Recent Labs Lab 07/12/15 0528  WBC 7.8  HGB 10.5*  HCT 31.1*  PLT 154   ------------------------------------------------------------------------------------------------------------------  Chemistries   Recent Labs Lab 07/12/15 1234  NA 136  K 5.5*  CL 107  CO2 25  GLUCOSE 191*  BUN 25*  CREATININE 1.13*  CALCIUM 8.7*   ------------------------------------------------------------------------------------------------------------------  Cardiac Enzymes No results for input(s): TROPONINI in the last 168 hours. ------------------------------------------------------------------------------------------------------------------  RADIOLOGY:  Dg Hip Operative Unilat With Pelvis Right  07/10/2015  CLINICAL DATA:  Right hip fracture fixation. EXAM: OPERATIVE RIGHT HIP (WITH PELVIS IF PERFORMED) 2 VIEWS TECHNIQUE: Fluoroscopic spot image(s) were submitted for interpretation post-operatively. COMPARISON:  Radiographs 07/09/2015. FINDINGS: Spot AP and frog-leg lateral fluoroscopic images of the right hip demonstrate fixation  of femoral neck  fracture with 3 cannulated screws. The hardware appears well positioned. No complications are identified. There are surgical clips in the right groin. IMPRESSION: No demonstrated complication following right femoral neck fixation. Electronically Signed   By: Richardean Sale M.D.   On: 07/10/2015 17:56    EKG:   Orders placed or performed in visit on 06/18/13  . EKG 12-Lead    ASSESSMENT AND PLAN:  #1 right hip fracture nondisplaced initial encounter:  Status post ORIF 11/16. Continue pain medications, DVT prophylaxis, incentive spirometry.  physical therapy recommends rehabilitation. She will likely discharge to SNF tomorrow   2. UTI follow urine cultures, UA was abnormal on admission.  Rocephin while inpatient. Switch to Keflex upon discharge. Urine culture is sensitive   #3 history of COPD: No wheezing. Continue Spiriva, albuterol.  #4 hypertension continue home medications   5. acute renal failure secondary to ATN; continue IV hydration.,renal  function improved. Potassium has been elevated. She reports that this is been the case for her in the past. Not on any medications which would cause this. Kayexalate given today.  #6 history of coronary artery disease status post CABG: Continue Imdur, Coreg, aspirin,statins dispo to SNF/STR   All the records are reviewed and case discussed with Care Management/Social Workerr. Management plans discussed with the patient, family and they are in agreement.  CODE STATUS: full  TOTAL TIME TAKING CARE OF THIS PATIENT: 35 minutes.   POSSIBLE D/C IN one DAYS, DEPENDING ON CLINICAL CONDITION.   Myrtis Ser M.D on 07/12/2015 at 4:46 PM  Between 7am to 6pm - Pager - [phone]  After 6pm go to [email or web address] - password [email or web address] [facility]  Office  [phone]  CC: Primary care physician; Alessandra Grout, MD   Note: This dictation was prepared with Dragon dictation along with smaller phrase technology. Any  transcriptional errors that result from this process are unintentional.

## 2015-07-12 NOTE — Progress Notes (Signed)
Physical Therapy Treatment Patient Details Name: Anita Wilson MRN: [id number] DOB: 01-06-1946 Today's Date: 07/12/2015    History of Present Illness 69 yo female with multiple falls at home, resulting in IM nailing R hip for fem neck fracture.  PMHx:  DM, CABG, breast CA, depression, a-fib    PT Comments    Mobility progression limited by pain this AM; RN informed/aware and to administer additional medication as appropriate.  Despite pain, patient displays good active strength and ROM of RLE with isolated therex; does require increased encouragement for full effort, as tends to be somewhat self-limiting at times.  Refused additional gait efforts this AM due to pain; will attempt additional gait in PM. Vitals (HR and cardiac rhythm) stable and WFL throughout session.   Follow Up Recommendations  SNF     Equipment Recommendations       Recommendations for Other Services       Precautions / Restrictions      Mobility  Bed Mobility Overal bed mobility: Needs Assistance Bed Mobility: Supine to Sit     Supine to sit: Min assist;Mod assist        Transfers Overall transfer level: Needs assistance Equipment used: Rolling walker (2 wheeled) Transfers: Sit to/from Stand Sit to Stand: Min guard Stand pivot transfers: Min guard;Min assist       General transfer comment: cuing for hand placement and RW use (initially attempting SPT without RW); impulsive  Ambulation/Gait Ambulation/Gait assistance: Min assist Ambulation Distance (Feet): 5 Feet Assistive device: Rolling walker (2 wheeled)       General Gait Details: step to gait pattern from [facility] to recliner, cga/min assist--decreased stance time/weight acceptance R LE; maintains weight on R forefoot (no foot flat). Refused additional gait distance due to pain.   Stairs            Wheelchair Mobility    Modified Rankin (Stroke Patients Only)       Balance Overall balance assessment: Needs  assistance Sitting-balance support: No upper extremity supported;Feet supported Sitting balance-Leahy Scale: Good     Standing balance support: Bilateral upper extremity supported Standing balance-Leahy Scale: Fair                      Cognition Arousal/Alertness: Awake/alert Behavior During Therapy: WFL for tasks assessed/performed Overall Cognitive Status: Within Functional Limits for tasks assessed                      Exercises Other Exercises Other Exercises: Supine LE therex, 1x10: ankle pumps, SAQs; seated LE therex, 1x10: LAQs, marching, iso hip adduct.  Increased time and encouragement required for completion; somewhat self-limiting at times Other Exercises: Toilet transfer, SPT without RW, cga/min assist--patient impulsively initiating transfer prior to cuing from therapist, expressing sense of urgency.  Sit/stand from [facility], cga with RW.  Standing balance for clothing management with RW, min assist (R lateral LOB x1)    General Comments        Pertinent Vitals/Pain Pain Score: 8  Pain Location: R hip Pain Descriptors / Indicators: Aching Pain Intervention(s): Limited activity within patient's tolerance;Monitored during session;Repositioned;Patient requesting pain meds-RN notified    Home Living                      Prior Function            PT Goals (current goals can now be found in the care plan section) Acute Rehab PT Goals Patient Stated  Goal: to go home PT Goal Formulation: With patient Time For Goal Achievement: 07/25/15 Potential to Achieve Goals: Good Progress towards PT goals: Progressing toward goals    Frequency  BID    PT Plan Current plan remains appropriate    Co-evaluation             End of Session Equipment Utilized During Treatment: Gait belt Activity Tolerance: Patient tolerated treatment well;Patient limited by pain Patient left: in chair;with call bell/phone within reach;with chair alarm set      Time: [id number] PT Time Calculation (min) (ACUTE ONLY): 21 min  Charges:  $Therapeutic Activity: 8-22 mins                    G Codes:      Alwaleed Obeso H. Owens Shark, PT, DPT, NCS 07/12/2015, 1:23 PM [phone]

## 2015-07-13 LAB — GLUCOSE, CAPILLARY
GLUCOSE-CAPILLARY: 166 mg/dL — AB (ref 65–99)
Glucose-Capillary: 181 mg/dL — ABNORMAL HIGH (ref 65–99)

## 2015-07-13 LAB — BASIC METABOLIC PANEL
ANION GAP: 10 (ref 5–15)
BUN: 23 mg/dL — ABNORMAL HIGH (ref 6–20)
CALCIUM: 8.6 mg/dL — AB (ref 8.9–10.3)
CHLORIDE: 105 mmol/L (ref 101–111)
CO2: 24 mmol/L (ref 22–32)
CREATININE: 1.17 mg/dL — AB (ref 0.44–1.00)
GFR calc Af Amer: 54 mL/min — ABNORMAL LOW (ref >60)
GFR calc non Af Amer: 47 mL/min — ABNORMAL LOW (ref >60)
GLUCOSE: 241 mg/dL — AB (ref 65–99)
POTASSIUM: 4.6 mmol/L (ref 3.5–5.1)
Sodium: 139 mmol/L (ref 135–145)

## 2015-07-13 LAB — CBC
HEMATOCRIT: 30.6 % — AB (ref 35.0–47.0)
HEMOGLOBIN: 10.4 g/dL — AB (ref 12.0–16.0)
MCH: 32.1 pg (ref 26.0–34.0)
MCHC: 34.1 g/dL (ref 32.0–36.0)
MCV: 94.1 fL (ref 80.0–100.0)
Platelets: 163 10*3/uL (ref 150–440)
RBC: 3.25 MIL/uL — ABNORMAL LOW (ref 3.80–5.20)
RDW: 13.4 % (ref 11.5–14.5)
WBC: 7.5 10*3/uL (ref 3.6–11.0)

## 2015-07-13 MED ORDER — ENOXAPARIN SODIUM 40 MG/0.4ML ~~LOC~~ SOLN: 40.0000 mg | SUBCUTANEOUS | Status: DC

## 2015-07-13 MED ORDER — BISACODYL 10 MG RE SUPP: 10.0000 mg | Freq: Every day | RECTAL | Status: DC | PRN

## 2015-07-13 MED ORDER — INSULIN GLARGINE 100 UNIT/ML ~~LOC~~ SOLN: 10.0000 [IU] | Freq: Every day | SUBCUTANEOUS | Status: DC

## 2015-07-13 NOTE — Progress Notes (Signed)
Physical Therapy Treatment Patient Details Name: Anita Wilson MRN: [id number] DOB: [birth year] Today's Date: 07/13/2015    History of Present Illness 69 yo female with multiple falls at home, resulting in IM nailing R hip for fem neck fracture.  PMHx:  DM, CABG, breast CA, depression, a-fib    PT Comments    Pt is able to get to EOB and to standing fairly well, but does poorly with ambulation as she is completely unable/unwilling to put weight down through her heel and is heavily reliant on her UEs to advance.  She generally remains impulsive and does not show great safety awareness t/o the session.   Follow Up Recommendations  SNF     Equipment Recommendations       Recommendations for Other Services       Precautions / Restrictions Precautions Precautions: Fall Restrictions RLE Weight Bearing: Weight bearing as tolerated    Mobility  Bed Mobility Overal bed mobility: Modified Independent Bed Mobility: Supine to Sit     Supine to sit: Min guard     General bed mobility comments: Pt is able to get herself to EOB w/ only VCs and encouragement   Transfers Overall transfer level: Modified independent Equipment used: Rolling walker (2 wheeled) Transfers: Sit to/from Stand Sit to Stand: Min guard         General transfer comment: Pt again impulsive with getting up, but ultiamtely does so safely and w/o excessive cuing, etc  Ambulation/Gait Ambulation/Gait assistance: Min guard Ambulation Distance (Feet): 50 Feet Assistive device: Rolling walker (2 wheeled)       General Gait Details: Again pt was not able to effectively take weight through R LE, even with cuing to place her heel down she remains up on her toes and highly reliant on her UEs during L swing through.  Pt has no LOBs, but does no ambulate very well   Science writer    Modified Rankin (Stroke Patients Only)       Balance                                     Cognition Arousal/Alertness: Awake/alert Behavior During Therapy: Impulsive;WFL for tasks assessed/performed                        Exercises General Exercises - Lower Extremity Ankle Circles/Pumps: AROM;10 reps Quad Sets: Strengthening;10 reps Gluteal Sets: Strengthening;10 reps Heel Slides: Strengthening;10 reps Hip ABduction/ADduction: Strengthening;10 reps Straight Leg Raises: AAROM;10 reps    General Comments        Pertinent Vitals/Pain Pain Assessment: 0-10 Pain Score: 8     Home Living                      Prior Function            PT Goals (current goals can now be found in the care plan section) Progress towards PT goals: Progressing toward goals    Frequency  BID    PT Plan Current plan remains appropriate    Co-evaluation             End of Session Equipment Utilized During Treatment: Gait belt Activity Tolerance: Patient tolerated treatment well;Patient limited by fatigue Patient left: with chair alarm set;with call bell/phone within reach     Time: [phone]  PT Time Calculation (min) (ACUTE ONLY): 25 min  Charges:  $Gait Training: 8-22 mins $Therapeutic Exercise: 8-22 mins                    G Codes:     Wayne Both, PT, DPT [phone]  Kreg Shropshire 07/13/2015, 12:43 PM

## 2015-07-13 NOTE — Discharge Instructions (Signed)
Diet: As you were doing prior to hospitalization   Shower:  May shower but keep the wounds dry, use an occlusive plastic wrap, NO SOAKING IN TUB.  If the bandage gets wet, change with a clean dry gauze.  Dressing:  You may change your dressing as needed. Change the dressing with sterile gauze dressing.  Remove staples and apply steri strips on 07/24/15.  Activity:  Increase activity slowly as tolerated, but follow the weight bearing instructions below.  No lifting or driving for 6 weeks.  Weight Bearing:   Weight bearing as tolerated to right lower extremity  To prevent constipation: you may use a stool softener such as -  Colace (over the counter) 100 mg by mouth twice a day  Drink plenty of fluids (prune juice may be helpful) and high fiber foods Miralax (over the counter) for constipation as needed.    Itching:  If you experience itching with your medications, try taking only a single pain pill, or even half a pain pill at a time.  You may take up to 10 pain pills per day, and you can also use benadryl over the counter for itching or also to help with sleep.   Precautions:  If you experience chest pain or shortness of breath - call 911 immediately for transfer to the hospital emergency department!!  If you develop a fever greater that 101 F, purulent drainage from wound, increased redness or drainage from wound, or calf pain-Call [facility]                                              Follow- Up Appointment:  Please call for an appointment to be seen in 2 weeks at [facility]. Remove staples and apply steri strips on 07/24/15.

## 2015-07-13 NOTE — Clinical Social Work Placement (Signed)
   CLINICAL SOCIAL WORK PLACEMENT  NOTE  Date:  07/13/2015  Patient Details  Name: Anita Wilson MRN: [id number] Date of Birth: 1945/11/17  Clinical Social Work is seeking post-discharge placement for this patient at the [place] level of care (*CSW will initial, date and re-position this form in  chart as items are completed):  Yes   Patient/family provided with [facility] [place] Work Department's list of facilities offering this level of care within the geographic area requested by the patient (or if unable, by the patient's family).  Yes   Patient/family informed of their freedom to choose among providers that offer the needed level of care, that participate in Medicare, Medicaid or managed care program needed by the patient, have an available bed and are willing to accept the patient.  Yes   Patient/family informed of [facility]'s ownership interest in [facility] and [facility], as well as of the fact that they are under no obligation to receive care at these facilities.  PASRR submitted to EDS on 07/10/15     PASRR number received on 07/10/15     Existing PASRR number confirmed on       FL2 transmitted to all facilities in geographic area requested by pt/family on 07/10/15     FL2 transmitted to all facilities within larger geographic area on       Patient informed that his/her managed care company has contracts with or will negotiate with certain facilities, including the following:        Yes   Patient/family informed of bed offers received.  Patient chooses bed at  [facility])     Physician recommends and patient chooses bed at      Patient to be transferred to  [facility]) on 07/13/15.  Patient to be transferred to facility by  (EMS)     Patient family notified on 07/13/15 of transfer.  Name of family member notified:   (daughter [name])     [place] Please sign FL2     Additional Comment:     _______________________________________________ Maurine Cane, LCSW 07/13/2015, 11:16 AM

## 2015-07-13 NOTE — Progress Notes (Addendum)
   Subjective: 3 Days Post-Op Procedure(s) (LRB): CANNULATED HIP PINNING (Right) Patient reports pain as moderate.   Patient is well, but has had some minor complaints of right thigh and groin pain. We will start therapy today.  Plan is to go Rehab after hospital stay.  Objective: Vital signs in last 24 hours: Temp:  [98 F (36.7 C)-98.3 F (36.8 C)] 98 F (36.7 C) (11/19 0754) Pulse Rate:  [62-102] 62 (11/19 0754) Resp:  [18] 18 (11/19 0754) BP: (140-179)/(62-94) 179/94 mmHg (11/19 0754) SpO2:  [92 %-98 %] 92 % (11/19 0754)  Intake/Output from previous day: 11/18 0701 - 11/19 0700 In: 440 [P.O.:440] Out: 0  Intake/Output this shift:     Recent Labs  07/11/15 0611 07/12/15 0528 07/13/15 0429  HGB 9.4* 10.5* 10.4*    Recent Labs  07/12/15 0528 07/13/15 0429  WBC 7.8 7.5  RBC 3.26* 3.25*  HCT 31.1* 30.6*  PLT 154 163    Recent Labs  07/12/15 1234 07/13/15 0429  NA 136 139  K 5.5* 4.6  CL 107 105  CO2 25 24  BUN 25* 23*  CREATININE 1.13* 1.17*  GLUCOSE 191* 241*  CALCIUM 8.7* 8.6*   No results for input(s): LABPT, INR in the last 72 hours.  EXAM General - Patient is Alert, Appropriate and Oriented Extremity - Neurovascular intact Sensation intact distally Intact pulses distally Dorsiflexion/Plantar flexion intact Dressing - dressing C/D/I and no drainage Motor Function - intact, moving foot and toes well on exam.   Past Medical History  Diagnosis Date  . Breast cancer ([place])   . Hypertension   . Diabetes mellitus without complication ([place])   . Allergy   . Depression   . COPD (chronic obstructive pulmonary disease) ([place])   . A-fib ([place])   . CAD (coronary artery disease)   . Chronic diastolic heart failure ([place])   . Pulmonary HTN ([place])   . CKD (chronic kidney disease), stage III     Assessment/Plan:   3 Days Post-Op Procedure(s) (LRB): CANNULATED HIP PINNING (Right) Principal Problem:   Fracture of femoral neck, right (HCC) Active  Problems:   Type 2 diabetes mellitus (HCC)   GERD (gastroesophageal reflux disease)   COPD (chronic obstructive pulmonary disease) (HCC)   CAD (coronary artery disease)   HTN (hypertension)   Chronic diastolic CHF (congestive heart failure) (HCC)   CKD (chronic kidney disease), stage III   A-fib (HCC)  Estimated body mass index is 20.76 kg/(m^2) as calculated from the following:   Height as of this encounter: 5\' 4"  (1.626 m).   Weight as of this encounter: 54.885 kg (121 lb). Continue with PT, WBAT RLE Discharge to rehab today Follow up with [facility] in 6 weeks for xray of right hip Remove staples and apply steri strips on 07/24/15  DVT Prophylaxis - Lovenox, Foot Pumps and TED hose Weight-Bearing as tolerated to right leg D/C O2 and Pulse OX and try on Room Air  T. Rachelle Hora, PA-C [place] 07/13/2015, 8:40 AM

## 2015-07-13 NOTE — Progress Notes (Signed)
Clinical Social Worker informed byCatherine Christen Butter, MD that patient is medically ready to discharge to SNF, Patient and daughter [name] are in a agreement with plan.  Call to [facility] to confirm that patient's bed is ready. Provided patient's room number 505 and number to call for report [phone] . All discharge information faxed to  Facility. Rx's added to discharge packet.   RN will call report and patient will discharge to [facility] via EMS.  Casimer Lanius. Latanya Presser, MSW Clinical Social Work Department [phone] 11:18 AM

## 2015-07-15 ENCOUNTER — Encounter: Payer: Self-pay | Admitting: Surgery

## 2015-07-16 NOTE — Anesthesia Postprocedure Evaluation (Signed)
Anesthesia Post Note  Patient: Anita Wilson  Procedure(s) Performed: Procedure(s) (LRB): CANNULATED HIP PINNING (Right)  Patient location during evaluation: PACU Anesthesia Type: Spinal Level of consciousness: oriented and awake and alert Pain management: pain level controlled Vital Signs Assessment: post-procedure vital signs reviewed and stable Respiratory status: spontaneous breathing, respiratory function stable and patient connected to nasal cannula oxygen Cardiovascular status: blood pressure returned to baseline and stable Postop Assessment: No backache and No signs of nausea or vomiting Anesthetic complications: no    Last Vitals:  Filed Vitals:   07/13/15 0429 07/13/15 0754  BP: 140/78 179/94  Pulse: 102 62  Temp: 36.8 C 36.7 C  Resp: 18 18    Last Pain:  Filed Vitals:   07/13/15 1307  PainSc: 3     LLE Motor Response: No movement due to regional block LLE Sensation: No sensation (absent) RLE Motor Response: No movement due to regional block RLE Sensation: No sensation (absent)      Molli Barrows

## 2015-07-22 ENCOUNTER — Encounter: Payer: Self-pay | Admitting: Surgery

## 2015-07-24 ENCOUNTER — Emergency Department: Payer: Medicare Other

## 2015-07-24 ENCOUNTER — Encounter: Payer: Self-pay | Admitting: Emergency Medicine

## 2015-07-24 ENCOUNTER — Inpatient Hospital Stay
Admission: EM | Admit: 2015-07-24 | Discharge: 2015-07-29 | DRG: 467 | Disposition: A | Discharge status: Skilled Nursing Facility | Payer: Medicare Other | Attending: Internal Medicine | Admitting: Internal Medicine

## 2015-07-24 DIAGNOSIS — Z885 Allergy status to narcotic agent status: Secondary | ICD-10-CM | POA: Diagnosis not present

## 2015-07-24 DIAGNOSIS — I130 Hypertensive heart and chronic kidney disease with heart failure and stage 1 through stage 4 chronic kidney disease, or unspecified chronic kidney disease: Secondary | ICD-10-CM | POA: Diagnosis present

## 2015-07-24 DIAGNOSIS — T84124A Displacement of internal fixation device of right femur, initial encounter: Principal | ICD-10-CM | POA: Diagnosis present

## 2015-07-24 DIAGNOSIS — R296 Repeated falls: Secondary | ICD-10-CM | POA: Diagnosis present

## 2015-07-24 DIAGNOSIS — Z794 Long term (current) use of insulin: Secondary | ICD-10-CM | POA: Diagnosis not present

## 2015-07-24 DIAGNOSIS — M25551 Pain in right hip: Secondary | ICD-10-CM | POA: Diagnosis present

## 2015-07-24 DIAGNOSIS — N189 Chronic kidney disease, unspecified: Secondary | ICD-10-CM | POA: Diagnosis present

## 2015-07-24 DIAGNOSIS — Z8249 Family history of ischemic heart disease and other diseases of the circulatory system: Secondary | ICD-10-CM | POA: Diagnosis not present

## 2015-07-24 DIAGNOSIS — Z820 Family history of epilepsy and other diseases of the nervous system: Secondary | ICD-10-CM | POA: Diagnosis not present

## 2015-07-24 DIAGNOSIS — N183 Chronic kidney disease, stage 3 (moderate): Secondary | ICD-10-CM | POA: Diagnosis present

## 2015-07-24 DIAGNOSIS — Z9010 Acquired absence of unspecified breast and nipple: Secondary | ICD-10-CM

## 2015-07-24 DIAGNOSIS — Z7982 Long term (current) use of aspirin: Secondary | ICD-10-CM

## 2015-07-24 DIAGNOSIS — I5042 Chronic combined systolic (congestive) and diastolic (congestive) heart failure: Secondary | ICD-10-CM | POA: Diagnosis present

## 2015-07-24 DIAGNOSIS — S73001A Unspecified subluxation of right hip, initial encounter: Secondary | ICD-10-CM | POA: Diagnosis present

## 2015-07-24 DIAGNOSIS — N179 Acute kidney failure, unspecified: Secondary | ICD-10-CM | POA: Diagnosis present

## 2015-07-24 DIAGNOSIS — J449 Chronic obstructive pulmonary disease, unspecified: Secondary | ICD-10-CM | POA: Diagnosis present

## 2015-07-24 DIAGNOSIS — I2510 Atherosclerotic heart disease of native coronary artery without angina pectoris: Secondary | ICD-10-CM | POA: Diagnosis present

## 2015-07-24 DIAGNOSIS — Z8781 Personal history of (healed) traumatic fracture: Secondary | ICD-10-CM

## 2015-07-24 DIAGNOSIS — E785 Hyperlipidemia, unspecified: Secondary | ICD-10-CM | POA: Diagnosis not present

## 2015-07-24 DIAGNOSIS — Z888 Allergy status to other drugs, medicaments and biological substances status: Secondary | ICD-10-CM | POA: Diagnosis not present

## 2015-07-24 DIAGNOSIS — Z951 Presence of aortocoronary bypass graft: Secondary | ICD-10-CM | POA: Diagnosis not present

## 2015-07-24 DIAGNOSIS — Z833 Family history of diabetes mellitus: Secondary | ICD-10-CM

## 2015-07-24 DIAGNOSIS — Z79899 Other long term (current) drug therapy: Secondary | ICD-10-CM | POA: Diagnosis not present

## 2015-07-24 DIAGNOSIS — F17200 Nicotine dependence, unspecified, uncomplicated: Secondary | ICD-10-CM | POA: Diagnosis present

## 2015-07-24 DIAGNOSIS — S73003A Unspecified subluxation of unspecified hip, initial encounter: Secondary | ICD-10-CM | POA: Diagnosis present

## 2015-07-24 DIAGNOSIS — E1122 Type 2 diabetes mellitus with diabetic chronic kidney disease: Secondary | ICD-10-CM | POA: Diagnosis present

## 2015-07-24 DIAGNOSIS — R1310 Dysphagia, unspecified: Secondary | ICD-10-CM | POA: Diagnosis present

## 2015-07-24 DIAGNOSIS — R0989 Other specified symptoms and signs involving the circulatory and respiratory systems: Secondary | ICD-10-CM

## 2015-07-24 DIAGNOSIS — F0390 Unspecified dementia without behavioral disturbance: Secondary | ICD-10-CM | POA: Diagnosis present

## 2015-07-24 DIAGNOSIS — Z853 Personal history of malignant neoplasm of breast: Secondary | ICD-10-CM | POA: Diagnosis not present

## 2015-07-24 DIAGNOSIS — Z9889 Other specified postprocedural states: Secondary | ICD-10-CM

## 2015-07-24 DIAGNOSIS — Z803 Family history of malignant neoplasm of breast: Secondary | ICD-10-CM | POA: Diagnosis not present

## 2015-07-24 DIAGNOSIS — Z96649 Presence of unspecified artificial hip joint: Secondary | ICD-10-CM

## 2015-07-24 DIAGNOSIS — I10 Essential (primary) hypertension: Secondary | ICD-10-CM | POA: Diagnosis not present

## 2015-07-24 DIAGNOSIS — Z9049 Acquired absence of other specified parts of digestive tract: Secondary | ICD-10-CM | POA: Diagnosis not present

## 2015-07-24 DIAGNOSIS — I480 Paroxysmal atrial fibrillation: Secondary | ICD-10-CM | POA: Diagnosis present

## 2015-07-24 DIAGNOSIS — Z882 Allergy status to sulfonamides status: Secondary | ICD-10-CM | POA: Diagnosis not present

## 2015-07-24 DIAGNOSIS — I471 Supraventricular tachycardia: Secondary | ICD-10-CM | POA: Diagnosis not present

## 2015-07-24 DIAGNOSIS — R072 Precordial pain: Secondary | ICD-10-CM | POA: Diagnosis not present

## 2015-07-24 DIAGNOSIS — R410 Disorientation, unspecified: Secondary | ICD-10-CM | POA: Diagnosis not present

## 2015-07-24 LAB — COMPREHENSIVE METABOLIC PANEL
ALBUMIN: 3 g/dL — AB (ref 3.5–5.0)
ALT: 16 U/L (ref 14–54)
ANION GAP: 8 (ref 5–15)
AST: 20 U/L (ref 15–41)
Alkaline Phosphatase: 111 U/L (ref 38–126)
BILIRUBIN TOTAL: 0.4 mg/dL (ref 0.3–1.2)
BUN: 38 mg/dL — ABNORMAL HIGH (ref 6–20)
CALCIUM: 8.8 mg/dL — AB (ref 8.9–10.3)
CO2: 29 mmol/L (ref 22–32)
Chloride: 100 mmol/L — ABNORMAL LOW (ref 101–111)
Creatinine, Ser: 1.25 mg/dL — ABNORMAL HIGH (ref 0.44–1.00)
GFR calc non Af Amer: 43 mL/min — ABNORMAL LOW (ref >60)
GFR, EST AFRICAN AMERICAN: 50 mL/min — AB (ref >60)
GLUCOSE: 231 mg/dL — AB (ref 65–99)
POTASSIUM: 4.8 mmol/L (ref 3.5–5.1)
Sodium: 137 mmol/L (ref 135–145)
TOTAL PROTEIN: 7.3 g/dL (ref 6.5–8.1)

## 2015-07-24 LAB — CBC WITH DIFFERENTIAL/PLATELET
BASOS PCT: 1 %
Basophils Absolute: 0 10*3/uL (ref 0–0.1)
EOS ABS: 0.1 10*3/uL (ref 0–0.7)
Eosinophils Relative: 1 %
HEMATOCRIT: 34.8 % — AB (ref 35.0–47.0)
Hemoglobin: 11.8 g/dL — ABNORMAL LOW (ref 12.0–16.0)
LYMPHS ABS: 1 10*3/uL (ref 1.0–3.6)
Lymphocytes Relative: 15 %
MCH: 32.1 pg (ref 26.0–34.0)
MCHC: 33.9 g/dL (ref 32.0–36.0)
MCV: 94.6 fL (ref 80.0–100.0)
MONO ABS: 0.6 10*3/uL (ref 0.2–0.9)
MONOS PCT: 9 %
Neutro Abs: 5 10*3/uL (ref 1.4–6.5)
Neutrophils Relative %: 74 %
Platelets: 276 10*3/uL (ref 150–440)
RBC: 3.68 MIL/uL — ABNORMAL LOW (ref 3.80–5.20)
RDW: 13.7 % (ref 11.5–14.5)
WBC: 6.8 10*3/uL (ref 3.6–11.0)

## 2015-07-24 LAB — URINALYSIS COMPLETE WITH MICROSCOPIC (ARMC ONLY)
BACTERIA UA: NONE SEEN
BILIRUBIN URINE: NEGATIVE
GLUCOSE, UA: NEGATIVE mg/dL
HGB URINE DIPSTICK: NEGATIVE
KETONES UR: NEGATIVE mg/dL
LEUKOCYTES UA: NEGATIVE
NITRITE: NEGATIVE
Protein, ur: >500 mg/dL — AB
SPECIFIC GRAVITY, URINE: 1.011 (ref 1.005–1.030)
Squamous Epithelial / LPF: NONE SEEN
pH: 6 (ref 5.0–8.0)

## 2015-07-24 LAB — TROPONIN I

## 2015-07-24 MED ORDER — HYDRALAZINE HCL 25 MG PO TABS
ORAL_TABLET | ORAL | Status: AC
  Filled 2015-07-24: qty 1

## 2015-07-24 MED ORDER — HYDRALAZINE HCL 10 MG PO TABS: 10.0000 mg | ORAL_TABLET | Freq: Once | ORAL | Status: DC

## 2015-07-24 MED ORDER — LORAZEPAM 2 MG/ML IJ SOLN
INTRAMUSCULAR | Status: AC
  Filled 2015-07-24: qty 1

## 2015-07-24 MED ORDER — LORAZEPAM 2 MG/ML IJ SOLN
0.5000 mg | Freq: Once | INTRAMUSCULAR | Status: AC
  Administered 2015-07-24: 0.5 mg via INTRAVENOUS

## 2015-07-24 MED ORDER — MEPERIDINE HCL 25 MG/ML IJ SOLN
12.5000 mg | INTRAMUSCULAR | Status: DC | PRN
  Administered 2015-07-25 (×2): 12.5 mg via INTRAVENOUS
  Filled 2015-07-24 (×2): qty 1

## 2015-07-24 MED ORDER — SODIUM CHLORIDE 0.9 % IV SOLN
INTRAVENOUS | Status: DC
  Administered 2015-07-25: 15:00:00 via INTRAVENOUS

## 2015-07-24 MED ORDER — HYDROCODONE-ACETAMINOPHEN 5-325 MG PO TABS
1.0000 | ORAL_TABLET | Freq: Four times a day (QID) | ORAL | Status: DC | PRN
  Administered 2015-07-25: 1 via ORAL
  Filled 2015-07-24: qty 1

## 2015-07-24 MED ORDER — SODIUM CHLORIDE 0.9 % IV BOLUS (SEPSIS)
500.0000 mL | Freq: Once | INTRAVENOUS | Status: AC
  Administered 2015-07-24: 500 mL via INTRAVENOUS

## 2015-07-24 MED ORDER — HYDRALAZINE HCL 25 MG PO TABS
12.5000 mg | ORAL_TABLET | Freq: Once | ORAL | Status: AC
  Administered 2015-07-24: 12.5 mg via ORAL

## 2015-07-24 MED ORDER — CARVEDILOL 25 MG PO TABS
25.0000 mg | ORAL_TABLET | Freq: Once | ORAL | Status: AC
  Administered 2015-07-24: 25 mg via ORAL
  Filled 2015-07-24: qty 1

## 2015-07-24 NOTE — ED Notes (Signed)
Lab called for lost urine specimen , lab says they will continue to look and call back

## 2015-07-24 NOTE — ED Notes (Signed)
meds given.  Pt to ct scan.

## 2015-07-24 NOTE — ED Notes (Addendum)
Pt via ems from [facility] for altered mental status. Pt had right hip surgery nov 19th , has 7 staples to right hip with no drainage or redness noted. Has had changes in LOC the past couple of day. Pt mumbling and not making sense during triage, pt complains of vague  right hip pain at this time. Pt alert and oriented to person.

## 2015-07-24 NOTE — Consult Note (Signed)
Patient 2 weeks post op hip pinning, has loss of reducdtion and will require revision to THA or heimarthroplasty. Will discuss with Dr Roland Rack in a.m.

## 2015-07-24 NOTE — ED Provider Notes (Signed)
[facility] Emergency Department Provider Note  ____________________________________________  Time seen:  On EMS arrival  I have reviewed the triage vital signs and the nursing notes.   HISTORY  Chief Complaint Altered Mental Status  Caveat-history of present illness and review of systems Limited secondary to the patient's confusion/delirium. Information is obtained from EMS on their arrival as well as staff at [facility].  HPI Anita Wilson is a 69 y.o. female with history of coronary artery disease as post CABG, hypertension, COPD, recent right hip fracture status post ORIF on [id number], complicated by urinary tract infection (treated with Keflex) and acute renal failure second or to HTN who [place] to concern for increased confusion. Staff at the facility reports that since her discharge/arrival to [facility] she has intermittently confused however this worsened today. No fevers or chills, no recurrent falls. She has been eating/drinking well. Staff reports that intermittently she will scream out in pain and appear confused as to where she is.   Past Medical History  Diagnosis Date  . Breast cancer ([place])   . Hypertension   . Diabetes mellitus without complication ([place])   . Allergy   . Depression   . COPD (chronic obstructive pulmonary disease) ([place])   . A-fib ([place])   . CAD (coronary artery disease)   . Chronic diastolic heart failure ([place])   . Pulmonary HTN ([place])   . CKD (chronic kidney disease), stage III     Patient Active Problem List   Diagnosis Date Noted  . Fracture of femoral neck, right ([place]) 07/10/2015  . Type 2 diabetes mellitus ([place]) 07/10/2015  . GERD (gastroesophageal reflux disease) 07/10/2015  . COPD (chronic obstructive pulmonary disease) ([place]) 07/10/2015  . CAD (coronary artery disease) 07/10/2015  . HTN (hypertension) 07/10/2015  . Chronic diastolic CHF (congestive heart failure) ([place])  07/10/2015  . CKD (chronic kidney disease), stage III 07/10/2015  . A-fib ([place]) 07/10/2015    Past Surgical History  Procedure Laterality Date  . Coronary artery bypass graft    . Mastectomy    . Parathyroidectomy    . Appendectomy    . Breast lumpectomy with axillary lymph node dissection    . Hip pinning,cannulated Right 07/10/2015    Procedure: CANNULATED HIP PINNING;  Surgeon: Corky Mull, MD;  Location: [facility] ORS;  Service: Orthopedics;  Laterality: Right;  right    Current Outpatient Rx  Name  Route  Sig  Dispense  Refill  . acetaminophen (TYLENOL) 325 MG tablet   Oral   Take 650 mg by mouth every 4 (four) hours as needed for mild pain.         Marland Kitchen alendronate (FOSAMAX) 70 MG tablet   Oral   Take 70 mg by mouth once a week. Pt takes on Monday. Take with a full glass of water on an empty stomach.         Marland Kitchen aspirin EC 81 MG tablet   Oral   Take 81 mg by mouth daily.         Marland Kitchen atorvastatin (LIPITOR) 40 MG tablet   Oral   Take 40 mg by mouth daily.         . bisacodyl (DULCOLAX) 10 MG suppository   Rectal   Place 1 suppository (10 mg total) rectally daily as needed for moderate constipation.   12 suppository   0   . carvedilol (COREG) 25 MG tablet   Oral   Take 25 mg by  mouth 2 (two) times daily.         . cholecalciferol (VITAMIN D) 1000 UNITS tablet   Oral   Take 1,000 Units by mouth daily.         . citalopram (CELEXA) 10 MG tablet   Oral   Take 10 mg by mouth daily.         Marland Kitchen enoxaparin (LOVENOX) 40 MG/0.4ML injection   Subcutaneous   Inject 0.4 mLs (40 mg total) into the skin daily.   11 Syringe   0   . EPINEPHrine (EPIPEN JR 2-PAK) 0.15 MG/0.3ML injection   Intramuscular   Inject 0.15 mg into the muscle as needed for anaphylaxis.         . ferrous sulfate 325 (65 FE) MG tablet   Oral   Take 1 tablet (325 mg total) by mouth 3 (three) times daily after meals.   90 tablet   3   . fluticasone (FLONASE) 50 MCG/ACT nasal spray    Each Nare   Place 2 sprays into both nostrils daily as needed for rhinitis.         Marland Kitchen glipiZIDE (GLUCOTROL) 10 MG tablet   Oral   Take 10 mg by mouth 2 (two) times daily.         . hydrALAZINE (APRESOLINE) 25 MG tablet   Oral   Take 25 mg by mouth 2 (two) times daily.         . insulin aspart (NOVOLOG) 100 UNIT/ML injection   Subcutaneous   Inject 3-13 Units into the skin 3 (three) times daily. Pt uses per sliding scale:    60-150:  0 units  151-200:  3 units  201-250:  5 units  251-300:  7 units  301-350:  9 units  351-400:  11 units  401-451:  13 units         . insulin glargine (LANTUS) 100 UNIT/ML injection   Subcutaneous   Inject 0.1 mLs (10 Units total) into the skin at bedtime. Patient taking differently: Inject 12 Units into the skin at bedtime.    10 mL   11   . isosorbide mononitrate (IMDUR) 30 MG 24 hr tablet   Oral   Take 30 mg by mouth daily.         Marland Kitchen letrozole (FEMARA) 2.5 MG tablet   Oral   Take 2.5 mg by mouth daily.         . metaxalone (SKELAXIN) 400 MG tablet   Oral   Take 400 mg by mouth every 4 (four) hours as needed for muscle spasms.         . Multiple Vitamin (MULTIVITAMIN WITH MINERALS) TABS tablet   Oral   Take 1 tablet by mouth daily.         Marland Kitchen omeprazole (PRILOSEC) 20 MG capsule   Oral   Take 20 mg by mouth 2 (two) times daily.         Marland Kitchen oxyCODONE-acetaminophen (ROXICET) 5-325 MG tablet   Oral   Take 1-2 tablets by mouth every 4 (four) hours as needed for severe pain.   15 tablet   0   . tiotropium (SPIRIVA) 18 MCG inhalation capsule   Inhalation   Place 18 mcg into inhaler and inhale daily.           Allergies Bee venom; Diphenhydramine; Morphine; Phenylalanine; and Sulfa antibiotics  Family History  Problem Relation Age of Onset  . Hypertension Mother   . Diabetes Mother   .  Heart disease Father   . Diabetes Father   . Diabetes Sister   . Breast cancer Sister   . Dementia Sister     Social  History Social History  Substance Use Topics  . Smoking status: Current Some Day Smoker  . Smokeless tobacco: Never Used  . Alcohol Use: Yes     Comment: Occassionally    Review of Systems  Constitutional: No fever/chills Gastrointestinal: no vomiting.  No diarrhea.  No constipation. Musculoskeletal: Negative for back pain.  Caveat-history of present illness and review of systems Limited secondary to the patient's confusion/delirium. Information is obtained from EMS on their arrival as well as staff at [facility]. ____________________________________________   PHYSICAL EXAM:  VITAL SIGNS: ED Triage Vitals  Enc Vitals Group     BP 07/24/15 1851 182/81 mmHg     Pulse Rate 07/24/15 1851 83     Resp 07/24/15 1851 18     Temp --      Temp src --      SpO2 07/24/15 1851 100 %     Weight --      Height --      Head Cir --      Peak Flow --      Pain Score --      Pain Loc --      Pain Edu? --      Excl. in [place]? --     Constitutional: Alert  And calm but answers "I don't know" when asked why she is in the ER. She then begins yelling out intermittently "help! Don't hurt me! Just help me with this pain I'm having" and points to the right hip. Eyes: Conjunctivae are normal. PERRL. EOMI. Head: Atraumatic. Nose: No congestion/rhinnorhea. Mouth/Throat: Mucous membranes are moist.  Oropharynx non-erythematous. Neck: No stridor.  Cardiovascular: Normal rate, regular rhythm. Grossly normal heart sounds.  Good peripheral circulation. Respiratory: Normal respiratory effort.  No retractions. Lungs CTAB. Gastrointestinal: Soft and nontender. No distention. No CVA tenderness. Genitourinary: deferred Musculoskeletal: The right hip is tender to palpation, there several staples in the right hip which are healing appropriately, no drainage, erythema, or fluctuance. 2+ DP pulse bilaterally. Neurologic:  Normal speech and language. Appears to move all extremities equally but does not  comply with formal neurological testing. Skin:  Skin is warm, dry and intact. No rash noted. Psychiatric: Mood and affect are normal. Speech and behavior are normal.  ____________________________________________   LABS (all labs ordered are listed, but only abnormal results are displayed)  Labs Reviewed  CBC WITH DIFFERENTIAL/PLATELET - Abnormal; Notable for the following:    RBC 3.68 (*)    Hemoglobin 11.8 (*)    HCT 34.8 (*)    All other components within normal limits  COMPREHENSIVE METABOLIC PANEL - Abnormal; Notable for the following:    Chloride 100 (*)    Glucose, Bld 231 (*)    BUN 38 (*)    Creatinine, Ser 1.25 (*)    Calcium 8.8 (*)    Albumin 3.0 (*)    GFR calc non Af Amer 43 (*)    GFR calc Af Amer 50 (*)    All other components within normal limits  URINALYSIS COMPLETEWITH MICROSCOPIC ([facility] ONLY) - Abnormal; Notable for the following:    Color, Urine YELLOW (*)    APPearance CLEAR (*)    Protein, ur >500 (*)    All other components within normal limits  TROPONIN I   ____________________________________________  EKG  ED ECG  REPORT I, Joanne Gavel, the attending physician, personally viewed and interpreted this ECG.   Date: 07/24/2015  EKG Time: 18:47  Rate: 77  Rhythm: normal sinus rhythm with PACs  Axis: normal  Intervals:right bundle branch block  ST&T Change: No acute ST elevation. No change when compared to 06/18/2013.  ____________________________________________  RADIOLOGY  CXR IMPRESSION: No active cardiopulmonary disease.  CT head IMPRESSION: No acute intracranial pathology.  [place] left hip IMPRESSION: Status post fixation of the right femoral neck fracture with evidence of slight interval increased impaction and displacement compared to 07/10/2015. ____________________________________________   PROCEDURES  Procedure(s) performed: None  Critical Care performed:  No  ____________________________________________   INITIAL IMPRESSION / ASSESSMENT AND PLAN / ED COURSE  Pertinent labs & imaging results that were available during my care of the patient were reviewed by me and considered in my medical decision making (see chart for details).  Anita Wilson is a 69 y.o. female with history of coronary artery disease as post CABG, hypertension, COPD, recent right hip fracture status post ORIF on [id number], complicated by urinary tract infection (treated with Keflex) and acute renal failure second or to HTN who presents from [place] due to concern for increased confusion which appears to be consistent with agitated delirium. On arrival, vital signs stable, she is afebrile. She is an intact neurological examination. Plan for screening labs, chest x-ray, UA, CT head as well as x-ray of the right hip. Reassess for disposition.  ----------------------------------------- 11:11 PM on 07/24/2015 ----------------------------------------- Patient now is calm, alert and oriented 3. CBC notable for mild anemia. Creatinine elevated at 1.25 however this appears somewhat to prior. Troponin negative. CT head and chest x-ray showed no acute abnormality. X-ray of the left hip is concerning for postoperative complication. I discussed this with Dr. Rudene Christians of ortho who has reviewed the films and recommends admission as she will likely require repeat operation. Case is discussed with the hospitalsist, Dr. Lavetta Nielsen, for admission at this time.  ____________________________________________   FINAL CLINICAL IMPRESSION(S) / ED DIAGNOSES  Final diagnoses:  Right hip pain  Status post-operative repair of hip fracture      Joanne Gavel, MD 07/24/15 2312

## 2015-07-25 ENCOUNTER — Encounter: Payer: Self-pay | Admitting: *Deleted

## 2015-07-25 ENCOUNTER — Inpatient Hospital Stay: Payer: Medicare Other | Admitting: Anesthesiology

## 2015-07-25 ENCOUNTER — Encounter
Admission: EM | Disposition: A | Discharge status: Skilled Nursing Facility | Payer: Self-pay | Source: Home / Self Care | Attending: Internal Medicine

## 2015-07-25 ENCOUNTER — Inpatient Hospital Stay: Payer: Medicare Other

## 2015-07-25 HISTORY — PX: HIP ARTHROPLASTY: SHX981

## 2015-07-25 LAB — GLUCOSE, CAPILLARY
Glucose-Capillary: 164 mg/dL — ABNORMAL HIGH (ref 65–99)
Glucose-Capillary: 181 mg/dL — ABNORMAL HIGH (ref 65–99)
Glucose-Capillary: 167 mg/dL — ABNORMAL HIGH (ref 65–99)
Glucose-Capillary: 175 mg/dL — ABNORMAL HIGH (ref 65–99)
Glucose-Capillary: 177 mg/dL — ABNORMAL HIGH (ref 65–99)

## 2015-07-25 LAB — TYPE AND SCREEN
ABO/RH(D): A POS
Antibody Screen: NEGATIVE

## 2015-07-25 LAB — BASIC METABOLIC PANEL
Anion gap: 7 (ref 5–15)
BUN: 33 mg/dL — ABNORMAL HIGH (ref 6–20)
CO2: 29 mmol/L (ref 22–32)
Calcium: 8.4 mg/dL — ABNORMAL LOW (ref 8.9–10.3)
Chloride: 102 mmol/L (ref 101–111)
Creatinine, Ser: 1.01 mg/dL — ABNORMAL HIGH (ref 0.44–1.00)
GFR calc Af Amer: >60 mL/min (ref >60)
GFR calc non Af Amer: 56 mL/min — ABNORMAL LOW (ref >60)
Glucose, Bld: 181 mg/dL — ABNORMAL HIGH (ref 65–99)
Potassium: 4.6 mmol/L (ref 3.5–5.1)
Sodium: 138 mmol/L (ref 135–145)

## 2015-07-25 LAB — CBC
HCT: 31.3 % — ABNORMAL LOW (ref 35.0–47.0)
Hemoglobin: 10.4 g/dL — ABNORMAL LOW (ref 12.0–16.0)
MCH: 31.5 pg (ref 26.0–34.0)
MCHC: 33.3 g/dL (ref 32.0–36.0)
MCV: 94.6 fL (ref 80.0–100.0)
Platelets: 243 10*3/uL (ref 150–440)
RBC: 3.31 MIL/uL — ABNORMAL LOW (ref 3.80–5.20)
RDW: 13.6 % (ref 11.5–14.5)
WBC: 5.3 10*3/uL (ref 3.6–11.0)

## 2015-07-25 LAB — MRSA PCR SCREENING: MRSA by PCR: NEGATIVE

## 2015-07-25 LAB — HEMOGLOBIN A1C: Hgb A1c MFr Bld: 6.6 % — ABNORMAL HIGH (ref 4.0–6.0)

## 2015-07-25 LAB — TSH: TSH: 0.796 u[IU]/mL (ref 0.350–4.500)

## 2015-07-25 SURGERY — HEMIARTHROPLASTY, HIP, DIRECT ANTERIOR APPROACH, FOR FRACTURE
Anesthesia: General | Site: Hip | Laterality: Right | Wound class: Clean

## 2015-07-25 MED ORDER — PHENYLEPHRINE HCL 10 MG/ML IJ SOLN
INTRAMUSCULAR | Status: DC | PRN
  Administered 2015-07-25 (×4): 100 ug via INTRAVENOUS

## 2015-07-25 MED ORDER — DOCUSATE SODIUM 100 MG PO CAPS
100.0000 mg | ORAL_CAPSULE | Freq: Two times a day (BID) | ORAL | Status: DC
  Filled 2015-07-25: qty 1

## 2015-07-25 MED ORDER — ACETAMINOPHEN 10 MG/ML IV SOLN
INTRAVENOUS | Status: AC
  Filled 2015-07-25: qty 100

## 2015-07-25 MED ORDER — METOCLOPRAMIDE HCL 5 MG/ML IJ SOLN
5.0000 mg | Freq: Three times a day (TID) | INTRAMUSCULAR | Status: DC | PRN
Start: 2015-07-25 — End: 2015-07-29

## 2015-07-25 MED ORDER — MIDAZOLAM HCL 5 MG/5ML IJ SOLN
INTRAMUSCULAR | Status: DC | PRN
  Administered 2015-07-25: 2 mg via INTRAVENOUS

## 2015-07-25 MED ORDER — ONDANSETRON HCL 4 MG/2ML IJ SOLN: 4.0000 mg | Freq: Four times a day (QID) | INTRAMUSCULAR | Status: DC | PRN

## 2015-07-25 MED ORDER — BUPIVACAINE LIPOSOME 1.3 % IJ SUSP
INTRAMUSCULAR | Status: AC
  Filled 2015-07-25: qty 20

## 2015-07-25 MED ORDER — INSULIN ASPART 100 UNIT/ML ~~LOC~~ SOLN
0.0000 [IU] | Freq: Three times a day (TID) | SUBCUTANEOUS | Status: DC
  Administered 2015-07-25: 1 [IU] via SUBCUTANEOUS
  Administered 2015-07-26 (×2): 2 [IU] via SUBCUTANEOUS
  Administered 2015-07-26: 5 [IU] via SUBCUTANEOUS
  Administered 2015-07-27 (×2): 3 [IU] via SUBCUTANEOUS
  Administered 2015-07-27: 5 [IU] via SUBCUTANEOUS
  Administered 2015-07-28: 2 [IU] via SUBCUTANEOUS
  Administered 2015-07-28 – 2015-07-29 (×2): 5 [IU] via SUBCUTANEOUS
  Administered 2015-07-29: 3 [IU] via SUBCUTANEOUS
  Filled 2015-07-25: qty 2
  Filled 2015-07-25 (×3): qty 5
  Filled 2015-07-25 (×2): qty 2
  Filled 2015-07-25: qty 3
  Filled 2015-07-25: qty 2
  Filled 2015-07-25: qty 5
  Filled 2015-07-25 (×2): qty 3

## 2015-07-25 MED ORDER — ATORVASTATIN CALCIUM 20 MG PO TABS
40.0000 mg | ORAL_TABLET | Freq: Every day | ORAL | Status: DC
  Administered 2015-07-26 – 2015-07-29 (×3): 40 mg via ORAL
  Filled 2015-07-25 (×3): qty 2

## 2015-07-25 MED ORDER — ROCURONIUM BROMIDE 100 MG/10ML IV SOLN
INTRAVENOUS | Status: DC | PRN
  Administered 2015-07-25 (×2): 10 mg via INTRAVENOUS

## 2015-07-25 MED ORDER — CEFAZOLIN SODIUM-DEXTROSE 2-3 GM-% IV SOLR
2.0000 g | Freq: Four times a day (QID) | INTRAVENOUS | Status: AC
  Administered 2015-07-25 – 2015-07-26 (×3): 2 g via INTRAVENOUS
  Filled 2015-07-25 (×3): qty 50

## 2015-07-25 MED ORDER — METAXALONE 800 MG PO TABS: 400.0000 mg | ORAL_TABLET | ORAL | Status: DC | PRN

## 2015-07-25 MED ORDER — INFLUENZA VAC SPLIT QUAD 0.5 ML IM SUSY
0.5000 mL | PREFILLED_SYRINGE | INTRAMUSCULAR | Status: DC
  Filled 2015-07-25: qty 0.5

## 2015-07-25 MED ORDER — DOCUSATE SODIUM 100 MG PO CAPS
100.0000 mg | ORAL_CAPSULE | Freq: Two times a day (BID) | ORAL | Status: DC
  Administered 2015-07-26 – 2015-07-29 (×7): 100 mg via ORAL
  Filled 2015-07-25 (×7): qty 1

## 2015-07-25 MED ORDER — CEFAZOLIN SODIUM-DEXTROSE 2-3 GM-% IV SOLR
2.0000 g | INTRAVENOUS | Status: AC
  Administered 2015-07-25: 2 g via INTRAVENOUS
  Filled 2015-07-25: qty 50

## 2015-07-25 MED ORDER — SODIUM CHLORIDE 0.9 % IJ SOLN
INTRAMUSCULAR | Status: AC
  Filled 2015-07-25: qty 50

## 2015-07-25 MED ORDER — FLUTICASONE PROPIONATE 50 MCG/ACT NA SUSP: 2.0000 | Freq: Every day | NASAL | Status: DC | PRN

## 2015-07-25 MED ORDER — DIPHENHYDRAMINE HCL 12.5 MG/5ML PO ELIX: 12.5000 mg | ORAL_SOLUTION | ORAL | Status: DC | PRN

## 2015-07-25 MED ORDER — PROPOFOL 10 MG/ML IV BOLUS
INTRAVENOUS | Status: DC | PRN
  Administered 2015-07-25: 125 mg via INTRAVENOUS
  Administered 2015-07-25: 50 mg via INTRAVENOUS
  Administered 2015-07-25: 25 mg via INTRAVENOUS

## 2015-07-25 MED ORDER — TIOTROPIUM BROMIDE MONOHYDRATE 18 MCG IN CAPS
18.0000 ug | ORAL_CAPSULE | Freq: Every day | RESPIRATORY_TRACT | Status: DC
  Administered 2015-07-26 – 2015-07-29 (×4): 18 ug via RESPIRATORY_TRACT
  Filled 2015-07-25 (×2): qty 5

## 2015-07-25 MED ORDER — ONDANSETRON HCL 4 MG/2ML IJ SOLN: 4.0000 mg | Freq: Once | INTRAMUSCULAR | Status: DC | PRN

## 2015-07-25 MED ORDER — NEOMYCIN-POLYMYXIN B GU 40-200000 IR SOLN
Status: DC | PRN
  Administered 2015-07-25: 16 mL

## 2015-07-25 MED ORDER — BUPIVACAINE HCL (PF) 0.5 % IJ SOLN
INTRAMUSCULAR | Status: DC | PRN
  Administered 2015-07-25: 30 mL

## 2015-07-25 MED ORDER — NEOMYCIN-POLYMYXIN B GU 40-200000 IR SOLN
Status: AC
  Filled 2015-07-25: qty 16

## 2015-07-25 MED ORDER — ONDANSETRON HCL 4 MG PO TABS: 4.0000 mg | ORAL_TABLET | Freq: Four times a day (QID) | ORAL | Status: DC | PRN

## 2015-07-25 MED ORDER — ALENDRONATE SODIUM 70 MG PO TABS: 70.0000 mg | ORAL_TABLET | ORAL | Status: DC

## 2015-07-25 MED ORDER — FENTANYL CITRATE (PF) 100 MCG/2ML IJ SOLN
INTRAMUSCULAR | Status: AC
  Filled 2015-07-25: qty 2

## 2015-07-25 MED ORDER — HYDROMORPHONE HCL 1 MG/ML IJ SOLN
0.5000 mg | INTRAMUSCULAR | Status: DC | PRN
  Administered 2015-07-27: 0.5 mg via INTRAVENOUS
  Filled 2015-07-25: qty 1

## 2015-07-25 MED ORDER — LETROZOLE 2.5 MG PO TABS
2.5000 mg | ORAL_TABLET | Freq: Every day | ORAL | Status: DC
  Administered 2015-07-26 – 2015-07-29 (×3): 2.5 mg via ORAL
  Filled 2015-07-25 (×3): qty 1

## 2015-07-25 MED ORDER — INSULIN GLARGINE 100 UNIT/ML ~~LOC~~ SOLN
6.0000 [IU] | Freq: Every day | SUBCUTANEOUS | Status: DC
  Administered 2015-07-26 – 2015-07-28 (×3): 6 [IU] via SUBCUTANEOUS
  Filled 2015-07-25 (×7): qty 0.06

## 2015-07-25 MED ORDER — SUCCINYLCHOLINE CHLORIDE 20 MG/ML IJ SOLN
INTRAMUSCULAR | Status: DC | PRN
  Administered 2015-07-25: 80 mg via INTRAVENOUS

## 2015-07-25 MED ORDER — BISACODYL 10 MG RE SUPP: 10.0000 mg | Freq: Every day | RECTAL | Status: DC | PRN

## 2015-07-25 MED ORDER — ADULT MULTIVITAMIN W/MINERALS CH
1.0000 | ORAL_TABLET | Freq: Every day | ORAL | Status: DC
Start: 2015-07-25 — End: 2015-07-29
  Administered 2015-07-26 – 2015-07-29 (×3): 1 via ORAL
  Filled 2015-07-25 (×3): qty 1

## 2015-07-25 MED ORDER — OXYCODONE-ACETAMINOPHEN 5-325 MG PO TABS: 1.0000 | ORAL_TABLET | ORAL | Status: DC | PRN

## 2015-07-25 MED ORDER — ACETAMINOPHEN 650 MG RE SUPP: 650.0000 mg | Freq: Four times a day (QID) | RECTAL | Status: DC | PRN

## 2015-07-25 MED ORDER — ACETAMINOPHEN 325 MG PO TABS
650.0000 mg | ORAL_TABLET | Freq: Four times a day (QID) | ORAL | Status: DC | PRN
  Administered 2015-07-29: 650 mg via ORAL
  Filled 2015-07-25: qty 2

## 2015-07-25 MED ORDER — ACETAMINOPHEN 325 MG PO TABS: 650.0000 mg | ORAL_TABLET | Freq: Four times a day (QID) | ORAL | Status: DC | PRN

## 2015-07-25 MED ORDER — ISOSORBIDE MONONITRATE ER 30 MG PO TB24
30.0000 mg | ORAL_TABLET | Freq: Every day | ORAL | Status: DC
  Administered 2015-07-26 – 2015-07-29 (×4): 30 mg via ORAL
  Filled 2015-07-25 (×4): qty 1

## 2015-07-25 MED ORDER — POTASSIUM CHLORIDE IN NACL 20-0.9 MEQ/L-% IV SOLN
INTRAVENOUS | Status: DC
  Administered 2015-07-25: 19:00:00 via INTRAVENOUS
  Filled 2015-07-25 (×3): qty 1000

## 2015-07-25 MED ORDER — TRANEXAMIC ACID 1000 MG/10ML IV SOLN
INTRAVENOUS | Status: AC
  Filled 2015-07-25: qty 10

## 2015-07-25 MED ORDER — LIDOCAINE HCL (CARDIAC) 20 MG/ML IV SOLN
INTRAVENOUS | Status: DC | PRN
  Administered 2015-07-25: 60 mg via INTRAVENOUS

## 2015-07-25 MED ORDER — METOCLOPRAMIDE HCL 5 MG PO TABS: 5.0000 mg | ORAL_TABLET | Freq: Three times a day (TID) | ORAL | Status: DC | PRN

## 2015-07-25 MED ORDER — FLEET ENEMA 7-19 GM/118ML RE ENEM: 1.0000 | ENEMA | Freq: Once | RECTAL | Status: DC | PRN

## 2015-07-25 MED ORDER — PANTOPRAZOLE SODIUM 40 MG PO TBEC
40.0000 mg | DELAYED_RELEASE_TABLET | Freq: Every day | ORAL | Status: DC
  Administered 2015-07-26 – 2015-07-29 (×3): 40 mg via ORAL
  Filled 2015-07-25 (×3): qty 1

## 2015-07-25 MED ORDER — ACETAMINOPHEN 10 MG/ML IV SOLN
INTRAVENOUS | Status: DC | PRN
  Administered 2015-07-25: 1000 mg via INTRAVENOUS

## 2015-07-25 MED ORDER — HYDRALAZINE HCL 20 MG/ML IJ SOLN
10.0000 mg | INTRAMUSCULAR | Status: DC | PRN
  Administered 2015-07-25: 10 mg via INTRAVENOUS
  Filled 2015-07-25 (×2): qty 1

## 2015-07-25 MED ORDER — BUPIVACAINE-EPINEPHRINE (PF) 0.25% -1:200000 IJ SOLN
INTRAMUSCULAR | Status: AC
  Filled 2015-07-25: qty 30

## 2015-07-25 MED ORDER — OXYCODONE HCL 5 MG PO TABS
5.0000 mg | ORAL_TABLET | ORAL | Status: DC | PRN
  Administered 2015-07-25 – 2015-07-29 (×6): 5 mg via ORAL
  Filled 2015-07-25 (×8): qty 1

## 2015-07-25 MED ORDER — SODIUM CHLORIDE 0.9 % IJ SOLN
3.0000 mL | Freq: Two times a day (BID) | INTRAMUSCULAR | Status: DC
  Administered 2015-07-25 – 2015-07-29 (×5): 3 mL via INTRAVENOUS

## 2015-07-25 MED ORDER — VITAMIN D 1000 UNITS PO TABS
1000.0000 [IU] | ORAL_TABLET | Freq: Every day | ORAL | Status: DC
  Administered 2015-07-26 – 2015-07-29 (×3): 1000 [IU] via ORAL
  Filled 2015-07-25 (×3): qty 1

## 2015-07-25 MED ORDER — HYDRALAZINE HCL 25 MG PO TABS
25.0000 mg | ORAL_TABLET | Freq: Two times a day (BID) | ORAL | Status: DC
  Administered 2015-07-26 – 2015-07-29 (×8): 25 mg via ORAL
  Filled 2015-07-25 (×8): qty 1

## 2015-07-25 MED ORDER — PNEUMOCOCCAL VAC POLYVALENT 25 MCG/0.5ML IJ INJ
0.5000 mL | INJECTION | INTRAMUSCULAR | Status: DC
  Filled 2015-07-25: qty 0.5

## 2015-07-25 MED ORDER — ONDANSETRON HCL 4 MG/2ML IJ SOLN
INTRAMUSCULAR | Status: DC | PRN
  Administered 2015-07-25: 4 mg via INTRAVENOUS

## 2015-07-25 MED ORDER — SODIUM CHLORIDE 0.9 % IV SOLN
INTRAVENOUS | Status: DC
  Administered 2015-07-25 (×2): via INTRAVENOUS

## 2015-07-25 MED ORDER — CITALOPRAM HYDROBROMIDE 20 MG PO TABS
10.0000 mg | ORAL_TABLET | Freq: Every day | ORAL | Status: DC
  Administered 2015-07-26 – 2015-07-29 (×3): 10 mg via ORAL
  Filled 2015-07-25: qty 1
  Filled 2015-07-25: qty 2
  Filled 2015-07-25: qty 1

## 2015-07-25 MED ORDER — FERROUS SULFATE 325 (65 FE) MG PO TABS
325.0000 mg | ORAL_TABLET | Freq: Three times a day (TID) | ORAL | Status: DC
  Administered 2015-07-25 – 2015-07-29 (×9): 325 mg via ORAL
  Filled 2015-07-25 (×9): qty 1

## 2015-07-25 MED ORDER — FENTANYL CITRATE (PF) 100 MCG/2ML IJ SOLN
25.0000 ug | INTRAMUSCULAR | Status: DC | PRN
  Administered 2015-07-25 (×4): 25 ug via INTRAVENOUS

## 2015-07-25 MED ORDER — FENTANYL CITRATE (PF) 250 MCG/5ML IJ SOLN
INTRAMUSCULAR | Status: DC | PRN
  Administered 2015-07-25: 100 ug via INTRAVENOUS
  Administered 2015-07-25: 25 ug via INTRAVENOUS
  Administered 2015-07-25: 50 ug via INTRAVENOUS
  Administered 2015-07-25: 25 ug via INTRAVENOUS

## 2015-07-25 MED ORDER — MAGNESIUM HYDROXIDE 400 MG/5ML PO SUSP: 30.0000 mL | Freq: Every day | ORAL | Status: DC | PRN

## 2015-07-25 MED ORDER — PHENYLEPHRINE HCL 10 MG/ML IJ SOLN
INTRAMUSCULAR | Status: AC
  Filled 2015-07-25: qty 1

## 2015-07-25 MED ORDER — ASPIRIN EC 81 MG PO TBEC
81.0000 mg | DELAYED_RELEASE_TABLET | Freq: Every day | ORAL | Status: DC
  Administered 2015-07-26 – 2015-07-29 (×3): 81 mg via ORAL
  Filled 2015-07-25 (×3): qty 1

## 2015-07-25 MED ORDER — ENOXAPARIN SODIUM 40 MG/0.4ML ~~LOC~~ SOLN
40.0000 mg | SUBCUTANEOUS | Status: DC
  Administered 2015-07-26 – 2015-07-29 (×4): 40 mg via SUBCUTANEOUS
  Filled 2015-07-25 (×4): qty 0.4

## 2015-07-25 MED ORDER — ENOXAPARIN SODIUM 40 MG/0.4ML ~~LOC~~ SOLN: 40.0000 mg | SUBCUTANEOUS | Status: DC

## 2015-07-25 MED ORDER — BUPIVACAINE-EPINEPHRINE (PF) 0.25% -1:200000 IJ SOLN: INTRAMUSCULAR | Status: DC | PRN

## 2015-07-25 MED ORDER — ACETAMINOPHEN 500 MG PO TABS
1000.0000 mg | ORAL_TABLET | Freq: Four times a day (QID) | ORAL | Status: AC
  Administered 2015-07-25 – 2015-07-26 (×2): 1000 mg via ORAL
  Filled 2015-07-25 (×2): qty 2

## 2015-07-25 MED ORDER — CARVEDILOL 25 MG PO TABS
25.0000 mg | ORAL_TABLET | Freq: Two times a day (BID) | ORAL | Status: DC
Start: 2015-07-25 — End: 2015-07-29
  Administered 2015-07-26 – 2015-07-29 (×8): 25 mg via ORAL
  Filled 2015-07-25 (×9): qty 1

## 2015-07-25 SURGICAL SUPPLY — 56 items
BAG DECANTER FOR FLEXI CONT (MISCELLANEOUS) IMPLANT
BLADE SAGITTAL WIDE XTHICK NO (BLADE) ×3 IMPLANT
BLADE SURG SZ20 CARB STEEL (BLADE) ×3 IMPLANT
BNDG COHESIVE 6X5 TAN STRL LF (GAUZE/BANDAGES/DRESSINGS) ×3 IMPLANT
BOWL CEMENT MIXING ADV NOZZLE (MISCELLANEOUS) IMPLANT
CANISTER SUCT 1200ML W/VALVE (MISCELLANEOUS) ×3 IMPLANT
CANISTER SUCT 3000ML (MISCELLANEOUS) ×3 IMPLANT
CAPT HIP HEMI 2 ×3 IMPLANT
CHLORAPREP W/TINT 26ML (MISCELLANEOUS) ×6 IMPLANT
Collarless porous stem ×3 IMPLANT
DECANTER SPIKE VIAL GLASS SM (MISCELLANEOUS) IMPLANT
DRAPE IMP U-DRAPE 54X76 (DRAPES) ×6 IMPLANT
DRAPE INCISE IOBAN 66X60 STRL (DRAPES) ×3 IMPLANT
DRAPE SHEET LG 3/4 BI-LAMINATE (DRAPES) ×3 IMPLANT
DRAPE SURG 17X23 STRL (DRAPES) ×3 IMPLANT
DRSG OPSITE POSTOP 4X12 (GAUZE/BANDAGES/DRESSINGS) IMPLANT
DRSG OPSITE POSTOP 4X14 (GAUZE/BANDAGES/DRESSINGS) IMPLANT
ELECT BLADE 6.5 EXT (BLADE) ×3 IMPLANT
ELECT CAUTERY BLADE 6.4 (BLADE) ×3 IMPLANT
GAUZE PACK 2X3YD (MISCELLANEOUS) ×3 IMPLANT
GLOVE BIO SURGEON STRL SZ8 (GLOVE) ×9 IMPLANT
GLOVE INDICATOR 8.0 STRL GRN (GLOVE) ×3 IMPLANT
GOWN STRL REUS W/ TWL LRG LVL3 (GOWN DISPOSABLE) ×1 IMPLANT
GOWN STRL REUS W/ TWL XL LVL3 (GOWN DISPOSABLE) ×1 IMPLANT
GOWN STRL REUS W/TWL LRG LVL3 (GOWN DISPOSABLE) ×2
GOWN STRL REUS W/TWL XL LVL3 (GOWN DISPOSABLE) ×2
HANDPIECE SUCTION TUBG SURGILV (MISCELLANEOUS) ×3 IMPLANT
HOOD PEEL AWAY FLYTE STAYCOOL (MISCELLANEOUS) ×6 IMPLANT
IV NS 100ML SINGLE PACK (IV SOLUTION) ×3 IMPLANT
NDL SAFETY 18GX1.5 (NEEDLE) ×3 IMPLANT
NEEDLE FILTER BLUNT 18X 1/2SAF (NEEDLE) ×2
NEEDLE FILTER BLUNT 18X1 1/2 (NEEDLE) ×1 IMPLANT
NEEDLE SPNL 20GX3.5 QUINCKE YW (NEEDLE) ×3 IMPLANT
NS IRRIG 1000ML POUR BTL (IV SOLUTION) ×3 IMPLANT
PACK HIP PROSTHESIS (MISCELLANEOUS) ×3 IMPLANT
PAD GROUND ADULT SPLIT (MISCELLANEOUS) ×3 IMPLANT
PILLOW ABDUC SM (MISCELLANEOUS) IMPLANT
REMOVER STAPLE SKIN (DISPOSABLE) ×3 IMPLANT
SOL .9 NS 3000ML IRR  AL (IV SOLUTION) ×2
SOL .9 NS 3000ML IRR UROMATIC (IV SOLUTION) ×1 IMPLANT
STAPLER SKIN PROX 35W (STAPLE) ×3 IMPLANT
STRAP SAFETY BODY (MISCELLANEOUS) ×3 IMPLANT
SUT ETHIBOND #5 BRAIDED 30INL (SUTURE) ×3 IMPLANT
SUT ETHIBOND 2 V 37 (SUTURE) ×6 IMPLANT
SUT ETHIBOND CT1 BRD #0 30IN (SUTURE) ×3 IMPLANT
SUT ORTHOCORD OS-6 NDL 36 (SUTURE) ×9 IMPLANT
SUT QUILL PDO 2 24X24 VLT (SUTURE) ×3 IMPLANT
SUT VIC AB 0 CT1 27 (SUTURE) ×4
SUT VIC AB 0 CT1 27XCR 8 STRN (SUTURE) ×2 IMPLANT
SUT VIC AB 1 CT1 36 (SUTURE) ×3 IMPLANT
SUT VIC AB 2-0 CT1 27 (SUTURE) ×4
SUT VIC AB 2-0 CT1 TAPERPNT 27 (SUTURE) ×2 IMPLANT
SYR 30ML LL (SYRINGE) ×3 IMPLANT
SYR TB 1ML 27GX1/2 LL (SYRINGE) ×3 IMPLANT
SYRINGE 10CC LL (SYRINGE) ×3 IMPLANT
TAPE TRANSPORE STRL 2 31045 (GAUZE/BANDAGES/DRESSINGS) ×3 IMPLANT

## 2015-07-25 NOTE — NC FL2 (Signed)
[place] LEVEL OF CARE SCREENING TOOL     IDENTIFICATION  Patient Name: Anita Wilson [id number] Birthdate: [birth year] Sex: female Admission Date (Current Location): 07/24/2015  [place] and [place] Number:  [facility] )   Facility and Address:  [facility] [address], [address] [place], [place] [address]      Provider Number: [id number]  Attending Physician Name and Address:  Vaughan Basta, MD  Relative Name and Phone Number:       Current Level of Care: Hospital Recommended Level of Care: [place] Prior Approval Number:    Date Approved/Denied:   PASRR Number:  ( [id number] )  Discharge Plan: SNF    Current Diagnoses: Patient Active Problem List   Diagnosis Date Noted  . 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 Fracture of femoral neck, right ([place]) 07/10/2015  . Type 2 diabetes mellitus ([place]) 07/10/2015  . GERD (gastroesophageal reflux disease) 07/10/2015  . COPD (chronic obstructive pulmonary disease) ([place]) 07/10/2015  . CAD (coronary artery disease) 07/10/2015  . HTN (hypertension) 07/10/2015  . Chronic diastolic CHF (congestive heart failure) ([place]) 07/10/2015  . CKD (chronic kidney disease), stage III 07/10/2015  . A-fib ([place]) 07/10/2015    Orientation ACTIVITIES/SOCIAL BLADDER RESPIRATION    Self  Passive Incontinent, Indwelling catheter Normal  BEHAVIORAL SYMPTOMS/MOOD NEUROLOGICAL BOWEL NUTRITION STATUS   (none )  (none ) Incontinent Diet (NPO for surgery )  PHYSICIAN VISITS COMMUNICATION OF NEEDS Height & Weight Skin  30 days Verbally 5\' 4"  (162.6 cm) 106 lbs. Surgical wounds (Incision: Right Hip. )          AMBULATORY STATUS RESPIRATION    Assist extensive Normal      Personal Care Assistance Level of Assistance  Bathing, Feeding, Dressing Bathing Assistance: Limited assistance Feeding assistance: Independent Dressing Assistance: Limited assistance      Functional Limitations Info   Sight, Hearing, Speech Sight Info: Impaired Hearing Info: Adequate Speech Info: Adequate       SPECIAL CARE FACTORS FREQUENCY  PT (By licensed PT), OT (By licensed OT)     PT Frequency:  (5) OT Frequency:  (5)           Additional Factors Info  Code Status, Allergies, Insulin Sliding Scale Code Status Info:  (Full Code. ) Allergies Info:  (Bee Venom, Diphenhydramine, Morphine, Phenylalanine, Sulfa Antibiotics)   Insulin Sliding Scale Info:  (Novolog Insulin Injections 3 times daily )       Current Medications (07/25/2015):  This is the current hospital active medication list Current Facility-Administered Medications  Medication Dose Route Frequency Provider Last Rate Last Dose  . 0.9 %  sodium chloride infusion   Intravenous Continuous Hessie Knows, MD      . 0.9 %  sodium chloride infusion   Intravenous Continuous Harrie Foreman, MD 100 mL/hr at 07/25/15 [phone]    . acetaminophen (TYLENOL) tablet 650 mg  650 mg Oral Q6H PRN Harrie Foreman, MD       Or  . acetaminophen (TYLENOL) suppository 650 mg  650 mg Rectal Q6H PRN Harrie Foreman, MD      . aspirin EC tablet 81 mg  81 mg Oral Daily Harrie Foreman, MD   81 mg at 07/25/15 0948  . atorvastatin (LIPITOR) tablet 40 mg  40 mg Oral Daily Harrie Foreman, MD   40 mg at 07/25/15 0948  . bisacodyl (DULCOLAX) suppository 10 mg  10 mg Rectal Daily PRN Harrie Foreman, MD      .  carvedilol (COREG) tablet 25 mg  25 mg Oral BID Harrie Foreman, MD   25 mg at 07/25/15 0059  . ceFAZolin (ANCEF) IVPB 2 g/50 mL premix  2 g Intravenous 30 min Pre-Op Corky Mull, MD      . cholecalciferol (VITAMIN D) tablet 1,000 Units  1,000 Units Oral Daily Harrie Foreman, MD   1,000 Units at 07/25/15 [phone]  . citalopram (CELEXA) tablet 10 mg  10 mg Oral Daily Harrie Foreman, MD   10 mg at 07/25/15 0949  . docusate sodium (COLACE) capsule 100 mg  100 mg Oral BID Harrie Foreman, MD   100 mg at 07/25/15 0119  . enoxaparin (LOVENOX)  injection 40 mg  40 mg Subcutaneous Q24H Harrie Foreman, MD      . ferrous sulfate tablet 325 mg  325 mg Oral TID PC Harrie Foreman, MD   325 mg at 07/25/15 0949  . fluticasone (FLONASE) 50 MCG/ACT nasal spray 2 spray  2 spray Each Nare Daily PRN Harrie Foreman, MD      . hydrALAZINE (APRESOLINE) injection 10 mg  10 mg Intravenous Q4H PRN Lytle [place], MD   10 mg at 07/25/15 0118  . hydrALAZINE (APRESOLINE) tablet 25 mg  25 mg Oral BID Harrie Foreman, MD   25 mg at 07/25/15 0100  . HYDROcodone-acetaminophen (NORCO/VICODIN) 5-325 MG per tablet 1-2 tablet  1-2 tablet Oral Q6H PRN Hessie Knows, MD   1 tablet at 07/25/15 0005  . [START ON 07/26/2015] Influenza vac split quadrivalent PF (FLUARIX) injection 0.5 mL  0.5 mL Intramuscular Tomorrow-1000 Hessie Knows, MD      . insulin aspart (novoLOG) injection 0-9 Units  0-9 Units Subcutaneous TID WC Harrie Foreman, MD   1 Units at 07/25/15 1133  . insulin glargine (LANTUS) injection 6 Units  6 Units Subcutaneous QHS Harrie Foreman, MD   6 Units at 07/25/15 0100  . isosorbide mononitrate (IMDUR) 24 hr tablet 30 mg  30 mg Oral Daily Harrie Foreman, MD   30 mg at 07/25/15 0951  . letrozole [facility]) tablet 2.5 mg  2.5 mg Oral Daily Harrie Foreman, MD   2.5 mg at 07/25/15 0951  . meperidine (DEMEROL) injection 12.5 mg  12.5 mg Intravenous Q2H PRN Hessie Knows, MD   12.5 mg at 07/25/15 0959  . metaxalone (SKELAXIN) tablet 400 mg  400 mg Oral Q4H PRN Harrie Foreman, MD      . multivitamin with minerals tablet 1 tablet  1 tablet Oral Daily Harrie Foreman, MD   1 tablet at 07/25/15 [phone]  . ondansetron (ZOFRAN) tablet 4 mg  4 mg Oral Q6H PRN Harrie Foreman, MD       Or  . ondansetron [facility]) injection 4 mg  4 mg Intravenous Q6H PRN Harrie Foreman, MD      . oxyCODONE-acetaminophen (PERCOCET/ROXICET) 5-325 MG per tablet 1-2 tablet  1-2 tablet Oral Q4H PRN Harrie Foreman, MD      . pantoprazole (PROTONIX) EC tablet 40 mg  40 mg  Oral Daily Harrie Foreman, MD   40 mg at 07/25/15 0951  . [START ON 07/26/2015] pneumococcal 23 valent vaccine (PNU-IMMUNE) injection 0.5 mL  0.5 mL Intramuscular Tomorrow-1000 Hessie Knows, MD      . sodium chloride 0.9 % injection 3 mL  3 mL Intravenous Q12H Harrie Foreman, MD   3 mL at 07/25/15 0101  . tiotropium [facility]) inhalation  capsule 18 mcg  18 mcg Inhalation Daily Harrie Foreman, MD   18 mcg at 07/25/15 0800     Discharge Medications: Please see discharge summary for a list of discharge medications.  Relevant Imaging Results:  Relevant Lab Results:  Recent Labs    Additional Information  (SSN: [id number])  Loralyn Freshwater, LCSW

## 2015-07-25 NOTE — Op Note (Signed)
07/24/2015 - 07/25/2015  4:19 PM  Patient:   Anita Wilson  Pre-Op Diagnosis:   Displaced femoral neck fracture status post prior in situ cannulated screw fixation of impacted femoral neck fracture, right hip.  Post-Op Diagnosis:   Same.  Procedure:   Right hip bipolar hemiarthroplasty with removal of 6.5 mm cannulated screws 3.  Surgeon:   Pascal Lux, MD  Assistant:   Cameron Proud, PA-C  Anesthesia:   General endotracheal  Findings:   As above.  Complications:   None  EBL:   100 cc  Fluids:   900 cc crystalloid  UOP:   350 cc  TT:   None  Drains:   None  Closure:   Staples  Implants:   Biomet press-fit system with a #13 lateral offset Echo femoral stem, a 45 mm outer diameter shell, a 28 mm head, and a -6 mm neck  Brief Clinical Note:   The patient is a 69 year old female who is now 2.5 weeks status post an in situ cannulated screw fixation of an impacted nondisplaced right femoral neck fracture. The patient initially was doing well but apparently began to get out of bed frequently without assistance while in rehabilitation and, according to the patient's daughter, actually fell about 8 or 10 times. She began to complain of increased right hip pain. She was brought to the emergency room last night where x-rays of the right hip demonstrated loss of alignment/displacement of the right femoral neck fracture. The patient has been cleared medically and presents at this time for definitive management of the injury.  Procedure:   The patient was brought into the operating room. After adequate general endotracheal intubation and anesthesia was obtained, the patient was repositioned in the left lateral decubitus position and secured using a lateral hip positioner. The right hip and lower extremity were prepped with ChloroPrep solution before being draped sterilely. Preoperative antibiotics were administered. A timeout was performed to verify the appropriate surgical site  before the previous incision was reopened and carried down through the subcutaneous tissues to expose the screw heads. Each of the screws were removed sequentially using the appropriate screwdriver. Next, a standard posterior approach to the hip was made through an approximately 4-5 inch incision centered over the tip of the greater trochanter. The incision was carried down through the subcutaneous tissues to expose the gluteal fascia and proximal end of the iliotibial band. These structures were split the length of the incision and the [place] self-retaining hip retractor placed. The bursal tissues were swept posteriorly to expose the short external rotators. The anterior border of the piriformis tendon was identified and this plane developed down through the capsule to enter the joint. Abundant fracture hematoma was suctioned. A flap of tissue was elevated off the posterior aspect of the femoral neck and greater trochanter and retracted posteriorly. This flap included the piriformis tendon, the short external rotators, and the posterior capsule. The femoral head was removed in its entirety, then taken to the back table where it was measured and found to be optimally replicated by a 45 mm head. The appropriate trial head was inserted and found to demonstrate an excellent suction fit.   Attention was directed to the femoral side. The femoral neck was recut 10-12 mm above the lesser trochanter using an oscillating saw. The piriformis fossa was debrided of soft tissues before the intramedullary canal was accessed through this point using a triple step reamer. The canal was reamed sequentially beginning with a #7  tapered reamer and progressing to a #13 tapered reamer. This provided excellent circumferential chatter. A box osteotome was used to establish version before the canal was broached sequentially beginning with a #7 broach and progressing to a #13 broach. This was left in place and several trial reductions  performed using both the standard and lateral offset femoral neck options. The permanent #13 lateral offset Echo femoral stem was impacted into place. A repeat trial reduction was performed using the -6 mm neck length. The -6 mm neck length demonstrated excellent stability both in extension and external rotation as well as with flexion to 90 and internal rotation beyond 70. It also was stable in the position of sleep. The 28 mm head with the -6 mm neck length and the 45 mm outer diameter shell construct was put together on the back table before being impacted onto the stem of the femoral component. The Morse taper locking mechanism was verified using manual distraction before the head was relocated and placed through a range of motion with the findings as described above.  The wound was copiously irrigated with bacitracin saline solution via the jet lavage system before the peri-incisional and pericapsular tissues were injected with 30 cc of 0.5% Sensorcaine with epinephrine to help with postoperative analgesia. The posterior flap was reapproximated to the posterior aspect of the greater trochanter using #2 Tycron interrupted sutures placed through drill holes. Several additional #2 Tycron interrupted sutures were used to reinforce this layer of closure. The iliotibial band was reapproximated using #1 Vicryl interrupted sutures before the gluteal fascia was closed using a running #1 Vicryl suture. The subcutaneous tissues were closed in several layers using 2-0 Vicryl interrupted sutures before the skin was closed using staples. A sterile occlusive dressing was applied to the wound before the patient was placed into an abduction wedge pillow. The patient was then rolled back into the supine position on the hospital bed before being awakened, extubated, and returned to the recovery room in satisfactory condition after tolerating the procedure well.

## 2015-07-25 NOTE — Clinical Social Work Note (Signed)
Clinical Social Work Assessment  Patient Details  Name: Anita Wilson MRN: [id number] Date of Birth: 02/16/1946  Date of referral:  07/25/15               Reason for consult:  Facility Placement, Other (Comment Required) (From [facility] (short term rehab)  )                Permission sought to share information with:  Chartered certified accountant granted to share information::  Yes, Verbal Permission Granted  Name::      [place]::   [place]  Relationship::     Contact Information:     Housing/Transportation Living arrangements for the past 2 months:  [place] of Information:  Adult Children Patient Interpreter Needed:  None Criminal Activity/Legal Involvement Pertinent to Current Situation/Hospitalization:  No - Comment as needed Significant Relationships:  Adult Children, Siblings Lives with:  Self, Facility Resident Do you feel safe going back to the place where you live?  Yes Need for family participation in patient care:  Yes (Comment)  Care giving concerns:  Patient has been to [facility] for short term rehab for 2 weeks. Before going to rehab patient lived at home alone in [place].    Social Worker assessment / plan: Patient discharged to [facility] 2 weeks ago after hip surgery. Patient was brought back in to [facility] for altered mental status and is having another hip surgery today. Per chart patient is confused and not alert and oriented. Clinical Social Worker (CSW) contacted patient's daughter Anita Wilson [phone] to complete assessment. Per Anita Wilson patient has been confused the whole 2 weeks she has been at [facility]. Per daughter the confusion has progressively gotten worse. Daughter reported that patient lives alone in [place]. Daughter reported that patient does not use drugs or drink alcohol. Daughter reported that the plan is for patient to return to  [facility] after this second surgery and finish up rehab. Per [facility] admissions coordinator at [facility] patient can return when medically stable. Per daughter patient does not have a HPOA.   FL2 complete and faxed out. CSW will continue to follow and assist as needed.   Employment status:  Retired Nurse, adult PT Recommendations:  Not assessed at this time Information / Referral to community resources:  [place]  Patient/Family's Response to care: Daughter is agreeable for patient to return to [facility].   Patient/Family's Understanding of and Emotional Response to Diagnosis, Current Treatment, and Prognosis: Daughter was pleasant throughout assessment.   Emotional Assessment Appearance:  Appears stated age Attitude/Demeanor/Rapport:  Unable to Assess Affect (typically observed):  Unable to Assess Orientation:  Oriented to Self, Fluctuating Orientation (Suspected and/or reported Sundowners) Alcohol / Substance use:  Not Applicable Psych involvement (Current and /or in the community):  No (Comment)  Discharge Needs  Concerns to be addressed:  Discharge Planning Concerns Readmission within the last 30 days:  No Current discharge risk:  Chronically ill, Dependent with Mobility Barriers to Discharge:  Continued Medical Work up   Loralyn Freshwater, LCSW 07/25/2015, 11:49 AM

## 2015-07-25 NOTE — H&P (Addendum)
Anita Wilson is an 69 y.o. female.   Chief Complaint: Mental status change HPI: The patient presents emergency department via EMS from her nursing home where she had been recuperating status post right hip arthroplasty. Nursing home staff to become concerned due to the patient's mental status change and cries of pain. In the emergency department the patient could not provide her own history but complained of pain up and down her right side. Occasionally she would point to her chest and continually says "somebody get it down, I just can't get it down". Troponin was negative. EKG shows no acute changes. CT of the head was negative for acute process. Orthopedic surgery was called to consult on the recent pinning procedure to her right hip and determined that it needed revision. Prior to surgery the patient's mental status needed to improve which prompted the emergency department staff to call for admission.  Past Medical History  Diagnosis Date  . Breast cancer ([place])   . Hypertension   . Diabetes mellitus without complication ([place])   . Allergy   . Depression   . COPD (chronic obstructive pulmonary disease) ([place])   . A-fib ([place])   . CAD (coronary artery disease)   . Chronic diastolic heart failure ([place])   . Pulmonary HTN ([place])   . CKD (chronic kidney disease), stage III     Past Surgical History  Procedure Laterality Date  . Coronary artery bypass graft    . Mastectomy    . Parathyroidectomy    . Appendectomy    . Breast lumpectomy with axillary lymph node dissection    . Hip pinning,cannulated Right 07/10/2015    Procedure: CANNULATED HIP PINNING;  Surgeon: Corky Mull, MD;  Location: [facility] ORS;  Service: Orthopedics;  Laterality: Right;  right    Family History  Problem Relation Age of Onset  . Hypertension Mother   . Diabetes Mother   . Heart disease Father   . Diabetes Father   . Diabetes Sister   . Breast cancer Sister   . Dementia Sister    Social History:  reports  that she has been smoking.  She has never used smokeless tobacco. She reports that she drinks alcohol. She reports that she does not use illicit drugs.  Allergies:  Allergies  Allergen Reactions  . Bee Venom Shortness Of Breath and Swelling  . Diphenhydramine Other (See Comments)    Reaction:  Unknown   . Morphine Other (See Comments)    Reaction:  Hallucinations   . Phenylalanine Diarrhea and Nausea And Vomiting  . Sulfa Antibiotics Other (See Comments)    Reaction:  Unknown     Prior to Admission medications   Medication Sig Start Date End Date Taking? Authorizing Provider  acetaminophen (TYLENOL) 325 MG tablet Take 650 mg by mouth every 4 (four) hours as needed for mild pain.   Yes Historical Provider, MD  alendronate (FOSAMAX) 70 MG tablet Take 70 mg by mouth once a week. Pt takes on Monday. Take with a full glass of water on an empty stomach.   Yes Historical Provider, MD  aspirin EC 81 MG tablet Take 81 mg by mouth daily.   Yes Historical Provider, MD  atorvastatin (LIPITOR) 40 MG tablet Take 40 mg by mouth daily.   Yes Historical Provider, MD  bisacodyl (DULCOLAX) 10 MG suppository Place 1 suppository (10 mg total) rectally daily as needed for moderate constipation. 07/13/15  Yes Aldean Jewett, MD  carvedilol (COREG) 25 MG tablet Take  25 mg by mouth 2 (two) times daily.   Yes Historical Provider, MD  cholecalciferol (VITAMIN D) 1000 UNITS tablet Take 1,000 Units by mouth daily.   Yes Historical Provider, MD  citalopram (CELEXA) 10 MG tablet Take 10 mg by mouth daily.   Yes Historical Provider, MD  EPINEPHrine (EPIPEN JR 2-PAK) 0.15 MG/0.3ML injection Inject 0.15 mg into the muscle as needed for anaphylaxis.   Yes Historical Provider, MD  ferrous sulfate 325 (65 FE) MG tablet Take 1 tablet (325 mg total) by mouth 3 (three) times daily after meals. 07/12/15  Yes Aldean Jewett, MD  fluticasone [facility]) 50 MCG/ACT nasal spray Place 2 sprays into both nostrils daily as needed  for rhinitis.   Yes Historical Provider, MD  glipiZIDE (GLUCOTROL) 10 MG tablet Take 10 mg by mouth 2 (two) times daily.   Yes Historical Provider, MD  hydrALAZINE (APRESOLINE) 25 MG tablet Take 25 mg by mouth 2 (two) times daily.   Yes Historical Provider, MD  insulin aspart (NOVOLOG) 100 UNIT/ML injection Inject 3-13 Units into the skin 3 (three) times daily. Pt uses per sliding scale:    60-150:  0 units  151-200:  3 units  201-250:  5 units  251-300:  7 units  301-350:  9 units  351-400:  11 units  401-451:  13 units   Yes Historical Provider, MD  insulin glargine (LANTUS) 100 UNIT/ML injection Inject 0.1 mLs (10 Units total) into the skin at bedtime. Patient taking differently: Inject 12 Units into the skin at bedtime.  07/13/15  Yes Aldean Jewett, MD  isosorbide mononitrate (IMDUR) 30 MG 24 hr tablet Take 30 mg by mouth daily.   Yes Historical Provider, MD  letrozole (FEMARA) 2.5 MG tablet Take 2.5 mg by mouth daily.   Yes Historical Provider, MD  metaxalone (SKELAXIN) 400 MG tablet Take 400 mg by mouth every 4 (four) hours as needed for muscle spasms.   Yes Historical Provider, MD  Multiple Vitamin (MULTIVITAMIN WITH MINERALS) TABS tablet Take 1 tablet by mouth daily.   Yes Historical Provider, MD  omeprazole (PRILOSEC) 20 MG capsule Take 20 mg by mouth 2 (two) times daily.   Yes Historical Provider, MD  oxyCODONE-acetaminophen (ROXICET) 5-325 MG tablet Take 1-2 tablets by mouth every 4 (four) hours as needed for severe pain. 07/12/15  Yes Aldean Jewett, MD  tiotropium (SPIRIVA) 18 MCG inhalation capsule Place 18 mcg into inhaler and inhale daily.   Yes Historical Provider, MD     Results for orders placed or performed during the hospital encounter of 07/24/15 (from the past 48 hour(s))  CBC with Differential     Status: Abnormal   Collection Time: 07/24/15  6:46 PM  Result Value Ref Range   WBC 6.8 3.6 - 11.0 K/uL   RBC 3.68 (L) 3.80 - 5.20 MIL/uL   Hemoglobin 11.8 (L)  12.0 - 16.0 g/dL   HCT 34.8 (L) 35.0 - 47.0 %   MCV 94.6 80.0 - 100.0 fL   MCH 32.1 26.0 - 34.0 pg   MCHC 33.9 32.0 - 36.0 g/dL   RDW 13.7 11.5 - 14.5 %   Platelets 276 150 - 440 K/uL   Neutrophils Relative % 74 %   Neutro Abs 5.0 1.4 - 6.5 K/uL   Lymphocytes Relative 15 %   Lymphs Abs 1.0 1.0 - 3.6 K/uL   Monocytes Relative 9 %   Monocytes Absolute 0.6 0.2 - 0.9 K/uL   Eosinophils Relative 1 %  Eosinophils Absolute 0.1 0 - 0.7 K/uL   Basophils Relative 1 %   Basophils Absolute 0.0 0 - 0.1 K/uL  Comprehensive metabolic panel     Status: Abnormal   Collection Time: 07/24/15  6:46 PM  Result Value Ref Range   Sodium 137 135 - 145 mmol/L   Potassium 4.8 3.5 - 5.1 mmol/L   Chloride 100 (L) 101 - 111 mmol/L   CO2 29 22 - 32 mmol/L   Glucose, Bld 231 (H) 65 - 99 mg/dL   BUN 38 (H) 6 - 20 mg/dL   Creatinine, Ser 1.25 (H) 0.44 - 1.00 mg/dL   Calcium 8.8 (L) 8.9 - 10.3 mg/dL   Total Protein 7.3 6.5 - 8.1 g/dL   Albumin 3.0 (L) 3.5 - 5.0 g/dL   AST 20 15 - 41 U/L   ALT 16 14 - 54 U/L   Alkaline Phosphatase 111 38 - 126 U/L   Total Bilirubin 0.4 0.3 - 1.2 mg/dL   GFR calc non Af Amer 43 (L) >60 mL/min   GFR calc Af Amer 50 (L) >60 mL/min    Comment: (NOTE) The eGFR has been calculated using the CKD EPI equation. This calculation has not been validated in all clinical situations. eGFR's persistently <60 mL/min signify possible Chronic Kidney Disease.    Anion gap 8 5 - 15  Troponin I     Status: None   Collection Time: 07/24/15  6:46 PM  Result Value Ref Range   Troponin I <0.03 <0.031 ng/mL    Comment:        NO INDICATION OF MYOCARDIAL INJURY.   Urinalysis complete, with microscopic ([facility] only)     Status: Abnormal   Collection Time: 07/24/15  8:34 PM  Result Value Ref Range   Color, Urine YELLOW (A) YELLOW   APPearance CLEAR (A) CLEAR   Glucose, UA NEGATIVE NEGATIVE mg/dL   Bilirubin Urine NEGATIVE NEGATIVE   Ketones, ur NEGATIVE NEGATIVE mg/dL   Specific Gravity,  Urine 1.011 1.005 - 1.030   Hgb urine dipstick NEGATIVE NEGATIVE   pH 6.0 5.0 - 8.0   Protein, ur >500 (A) NEGATIVE mg/dL   Nitrite NEGATIVE NEGATIVE   Leukocytes, UA NEGATIVE NEGATIVE   RBC / HPF 0-5 0 - 5 RBC/hpf   WBC, UA 0-5 0 - 5 WBC/hpf   Bacteria, UA NONE SEEN NONE SEEN   Squamous Epithelial / LPF NONE SEEN NONE SEEN   Hyaline Casts, UA PRESENT    Dg Chest 2 View  07/24/2015  CLINICAL DATA:  Altered mental status. EXAM: CHEST  2 VIEW COMPARISON:  April 16, 2011 FINDINGS: The heart size and mediastinal contours are stable. There is no focal infiltrate, pulmonary edema, or pleural effusion. The visualized skeletal structures are stable. IMPRESSION: No active cardiopulmonary disease. Electronically Signed   By: Abelardo Diesel M.D.   On: 07/24/2015 19:57   Ct Head Wo Contrast  07/24/2015  CLINICAL DATA:  Altered mental status EXAM: CT HEAD WITHOUT CONTRAST TECHNIQUE: Contiguous axial images were obtained from the base of the skull through the vertex without intravenous contrast. COMPARISON:  None. FINDINGS: There is no evidence of mass effect, midline shift, or extra-axial fluid collections. There is no evidence of a space-occupying lesion or intracranial hemorrhage. There is no evidence of a cortical-based area of acute infarction. There is are bilateral small old basal ganglia lacunar infarcts. There is generalized cerebral atrophy. There is periventricular white matter low attenuation likely secondary to microangiopathy. The ventricles and sulci  are appropriate for the patient's age. The basal cisterns are patent. Visualized portions of the orbits are unremarkable. The visualized portions of the paranasal sinuses and mastoid air cells are unremarkable. Cerebrovascular atherosclerotic calcifications are noted. The osseous structures are unremarkable. IMPRESSION: No acute intracranial pathology. Electronically Signed   By: Kathreen Devoid   On: 07/24/2015 19:28   Dg Hip Unilat With Pelvis 2-3  Views Right  07/24/2015  CLINICAL DATA:  Right hip fracture status postoperative fixation, postop pain EXAM: DG HIP (WITH OR WITHOUT PELVIS) 2-3V RIGHT COMPARISON:  07/09/2015, 07/10/2015 FINDINGS: 3 fixation screws reduce the proximal right femoral neck fracture. Fracture line remains visible with some residual displacement superiorly. Screws appear intact. Compare to the postoperative films of 07/10/2015, there is slight increased impaction at the fracture. IMPRESSION: Status post fixation of the right femoral neck fracture with evidence of slight interval increased impaction and displacement compared to 07/10/2015. Electronically Signed   By: Jerilynn Mages.  Shick M.D.   On: 07/24/2015 20:05    Review of Systems  Unable to perform ROS: mental status change  Musculoskeletal: Positive for joint pain (R hip).    Blood pressure 180/94, pulse 87, temperature 98.6 F (37 C), temperature source Oral, resp. rate 24, SpO2 99 %. Physical Exam  Nursing note and vitals reviewed. Constitutional: She appears well-developed and well-nourished. She appears distressed.  HENT:  Head: Normocephalic and atraumatic.  Mouth/Throat: Oropharynx is clear and moist.  Eyes: Conjunctivae and EOM are normal. Pupils are equal, round, and reactive to light. No scleral icterus.  Neck: Normal range of motion. Neck supple. No JVD present. No tracheal deviation present. No thyromegaly present.  Cardiovascular: Normal rate, regular rhythm and normal heart sounds.  Exam reveals no gallop and no friction rub.   No murmur heard. Respiratory: Effort normal and breath sounds normal.  GI: Soft. Bowel sounds are normal. She exhibits no distension. There is no tenderness.  Genitourinary:  deferred  Musculoskeletal: Normal range of motion. She exhibits no edema.  Staple line right hip clean; difficult to examine hip due to pain. Leg is not rotated  Lymphadenopathy:    She has no cervical adenopathy.  Neurological: She is alert. No cranial  nerve deficit. She exhibits normal muscle tone.  Skin: Skin is warm and dry. She is not diaphoretic.  Psychiatric:  Patient is delirious and in pain     Assessment/Plan This is a 69 year old Caucasian female admitted for delirium and right hip arthroplasty revision due to subluxation/hardware derangement. 1. Delirium: Possibly secondary to dehydration given acute on chronic kidney injury as well as clinical signs such as chapped lips and decrease skin turgor. Check for reversible causes of delirium: TSH and RPR. Avoid benzodiazepines. 2. Acute on chronic kidney injury: Creatinine increased above baseline. Hydrate with intravenous fluid. Avoid nephrotoxic agents. 3. Right hip revision: Pin needing repair. Manage pain with home medication as the patient's chart indicates she is allergic to morphine. This may also worsen delirium. 4. CAD: Status post CABG. No indication the patient has cardiac chest pain. We'll review nursing home medication administration record for last dose of alendronate as this can cause esophagitis that may give rise to the patient's complaint of chest pain. Continue Imdur 5. Hypertension: Uncontrolled at this time likely secondary to pain. Continue home medications. Hydralazine when necessary. 6. Diabetes mellitus type 2: Hold oral hypoglycemic agents. Continue basal insulin. Add sliding scale insulin while the patient is hospitalized. 7. COPD: Continue Spiriva 8.  DVT prophylaxis: SCDs. Start heparin following surgery.  9. GI prophylaxis: Pantoprazole. The patient is a full code. Time spent on admission orders and patient care approximately 45 minutes.  Harrie Foreman 07/25/2015, 12:03 AM

## 2015-07-25 NOTE — Progress Notes (Signed)
[place] [facility] [place] NAME: Anita Wilson    MR#:  [id number]  DATE OF BIRTH:  [birth year]  SUBJECTIVE:  CHIEF COMPLAINT:   Chief Complaint  Patient presents with  . Altered Mental Status    care of his severe pain on the right hip, noted subluxation.  Plan is for hemiarthroplasty today.   Patient appears confused, she is completely alert. May be due to his baseline dementia. REVIEW OF SYSTEMS:  Not able to provide any review of system because of mental status change. ROS  DRUG ALLERGIES:   Allergies  Allergen Reactions  . Bee Venom Shortness Of Breath and Swelling  . Diphenhydramine Other (See Comments)    Reaction:  Unknown   . Morphine Other (See Comments)    Reaction:  Hallucinations   . Phenylalanine Diarrhea and Nausea And Vomiting  . Sulfa Antibiotics Other (See Comments)    Reaction:  Unknown     VITALS:  Blood pressure 180/69, pulse 99, temperature 97.3 F (36.3 C), temperature source Oral, resp. rate 14, height 5\' 4"  (1.626 m), weight 48.081 kg (106 lb), SpO2 100 %.  PHYSICAL EXAMINATION:  Physical Exam   Constitutional: She appears well-developed and well-nourished. She appears distressed.  HENT:  Head: Normocephalic and atraumatic.  Mouth/Throat: Oropharynx is clear and moist.  Eyes: Conjunctivae and EOM are normal. Pupils are equal, round, and reactive to light. No scleral icterus.  Neck: Normal range of motion. Neck supple. No JVD present. No tracheal deviation present. No thyromegaly present.  Cardiovascular: Normal rate, regular rhythm and normal heart sounds. Exam reveals no gallop and no friction rub.  No murmur heard. Respiratory: Effort normal and breath sounds normal.  GI: Soft. Bowel sounds are normal. She exhibits no distension. There is no tenderness.  Genitourinary:  deferred  Musculoskeletal: Normal range of motion. She exhibits no edema.  Staple line right hip clean; difficult to  examine hip due to pain. Leg is not rotated  Lymphadenopathy:   She has no cervical adenopathy.  Neurological: She is alert. No cranial nerve deficit. She exhibits normal muscle tone.  Skin: Skin is warm and dry. She is not diaphoretic.  Psychiatric:  Patient is delirious and in pain  LABORATORY PANEL:   CBC  Recent Labs Lab 07/25/15 0100  WBC 5.3  HGB 10.4*  HCT 31.3*  PLT 243   ------------------------------------------------------------------------------------------------------------------  Chemistries   Recent Labs Lab 07/24/15 1846 07/25/15 0100  NA 137 138  K 4.8 4.6  CL 100* 102  CO2 29 29  GLUCOSE 231* 181*  BUN 38* 33*  CREATININE 1.25* 1.01*  CALCIUM 8.8* 8.4*  AST 20  --   ALT 16  --   ALKPHOS 111  --   BILITOT 0.4  --    ------------------------------------------------------------------------------------------------------------------  Cardiac Enzymes  Recent Labs Lab 07/24/15 1846  TROPONINI <0.03   ------------------------------------------------------------------------------------------------------------------  RADIOLOGY:  Dg Chest 2 View  07/24/2015  CLINICAL DATA:  Altered mental status. EXAM: CHEST  2 VIEW COMPARISON:  April 16, 2011 FINDINGS: The heart size and mediastinal contours are stable. There is no focal infiltrate, pulmonary edema, or pleural effusion. The visualized skeletal structures are stable. IMPRESSION: No active cardiopulmonary disease. Electronically Signed   By: Abelardo Diesel M.D.   On: 07/24/2015 19:57   Dg Pelvis 1-2 Views  07/25/2015  CLINICAL DATA:  Postop RIGHT hip arthroplasty EXAM: PELVIS - 1-2 VIEW COMPARISON:  None. FINDINGS: RIGHT hip total arthroplasty noted. No evidence fracture  dislocation. Joint space narrowing of the LEFT hip. IMPRESSION: No complication following RIGHT hip arthroplasty. Electronically Signed   By: Suzy Bouchard M.D.   On: 07/25/2015 17:47   Ct Head Wo Contrast  07/24/2015  CLINICAL  DATA:  Altered mental status EXAM: CT HEAD WITHOUT CONTRAST TECHNIQUE: Contiguous axial images were obtained from the base of the skull through the vertex without intravenous contrast. COMPARISON:  None. FINDINGS: There is no evidence of mass effect, midline shift, or extra-axial fluid collections. There is no evidence of a space-occupying lesion or intracranial hemorrhage. There is no evidence of a cortical-based area of acute infarction. There is are bilateral small old basal ganglia lacunar infarcts. There is generalized cerebral atrophy. There is periventricular white matter low attenuation likely secondary to microangiopathy. The ventricles and sulci are appropriate for the patient's age. The basal cisterns are patent. Visualized portions of the orbits are unremarkable. The visualized portions of the paranasal sinuses and mastoid air cells are unremarkable. Cerebrovascular atherosclerotic calcifications are noted. The osseous structures are unremarkable. IMPRESSION: No acute intracranial pathology. Electronically Signed   By: Kathreen Devoid   On: 07/24/2015 19:28   Dg Hip Unilat With Pelvis 2-3 Views Right  07/24/2015  CLINICAL DATA:  Right hip fracture status postoperative fixation, postop pain EXAM: DG HIP (WITH OR WITHOUT PELVIS) 2-3V RIGHT COMPARISON:  07/09/2015, 07/10/2015 FINDINGS: 3 fixation screws reduce the proximal right femoral neck fracture. Fracture line remains visible with some residual displacement superiorly. Screws appear intact. Compare to the postoperative films of 07/10/2015, there is slight increased impaction at the fracture. IMPRESSION: Status post fixation of the right femoral neck fracture with evidence of slight interval increased impaction and displacement compared to 07/10/2015. Electronically Signed   By: Jerilynn Mages.  Shick M.D.   On: 07/24/2015 20:05    ASSESSMENT AND PLAN:   Active Problems:   Hip subluxation [facility])  This is a 69 year old Caucasian female admitted for delirium  and right hip arthroplasty revision due to subluxation/hardware derangement. 1. Delirium: Possibly secondary to dehydration given acute on chronic kidney injury  Check for reversible causes of delirium: TSHas  and RPR. Avoid benzodiazepines.  We will talk to family or nursing staff about her baseline status. 2. Acute on chronic kidney injury: Creatinine increased above baseline. Hydrate with intravenous fluid. Avoid nephrotoxic agents. Slightly improved. 3. Right hip revision: Pin needing repair. And his pain with home medication as the patient's chart indicates she is allergic to morphine. This may also worsen delirium.  She was seen by orthopedic team and they are planning for hemiarthroplasty. 4. CAD: Status post CABG. No indication the patient has cardiac chest pain. We'll review nursing home medication administration record for last dose of alendronate as this can cause esophagitis that may give rise to the patient's complaint of chest pain. Continue Imdur 5. Hypertension: Uncontrolled at this time likely secondary to pain. Continue home medications. Hydralazine when necessary. 6. Diabetes mellitus type 2: Hold oral hypoglycemic agents. Continue basal insulin. Add sliding scale insulin while the patient is hospitalized. 7. COPD: Continue Spiriva 8. DVT prophylaxis: SCDs. Start heparin following surgery. 9. GI prophylaxis: Pantoprazole.   All the records are reviewed and case discussed with Care Management/Social Workerr. Management plans discussed with the patient, family and they are in agreement.  CODE STATUS: Full code  TOTAL TIME TAKING CARE OF THIS PATIENT: 35 minutes.     POSSIBLE D/C IN 2-3 DAYS, DEPENDING ON CLINICAL CONDITION.   Vaughan Basta M.D on 07/25/2015   Between  7am to 6pm - Pager - [phone]  After 6pm go to [email or web address] - password [email or web address] [facility]  Office  [phone]  CC: Primary care physician; Alessandra Grout,  MD  Note: This dictation was prepared with Dragon dictation along with smaller phrase technology. Any transcriptional errors that result from this process are unintentional.

## 2015-07-25 NOTE — Progress Notes (Signed)
Pt confused and currently confrontational. She spit out her beta blocker refusing medication.

## 2015-07-25 NOTE — Transfer of Care (Signed)
Immediate Anesthesia Transfer of Care Note  Patient: Anita Wilson  Procedure(s) Performed: Procedure(s): ARTHROPLASTY BIPOLAR HIP (HEMIARTHROPLASTY) (Right)  Patient Location: PACU  Anesthesia Type:General  Level of Consciousness: awake, alert , oriented and patient cooperative  Airway & Oxygen Therapy: Patient Spontanous Breathing and Patient connected to face mask oxygen  Post-op Assessment: Report given to RN, Post -op Vital signs reviewed and stable and Patient moving all extremities X 4  Post vital signs: Reviewed and stable  Last Vitals:  Filed Vitals:   07/25/15 0719 07/25/15 1111  BP: 165/77 175/81  Pulse: 103 79  Temp: 36.4 C 36.8 C  Resp: 18 16    Complications: No apparent anesthesia complications

## 2015-07-25 NOTE — Consult Note (Addendum)
ORTHOPAEDIC CONSULTATION  REQUESTING PHYSICIAN: Vaughan Basta, MD  Chief Complaint:   Persistent right hip pain status post in situ cannulated screw fixation of impacted right femoral neck fracture.  History of Present Illness: Anita Wilson is a 69 y.o. female who is now 2.5 weeks status post an in situ cannulated screw fixation of an impacted right femoral neck fracture. The patient has been in rehabilitation since being discharged from the hospital. Apparently, she has been complaining of increased pain in her hip, especially with weightbearing. Furthermore, the nursing staff at the rehabilitation center has become increasingly concerned by her apparent mental status changes. She was brought back to the emergency room last night where cardiac workup and head CT were unremarkable. X-rays of her pelvis and right hip, however, have demonstrated some shift of the femoral neck fracture into a more unstable position. She is admitted at this time for pain control and definitive management of her right hip. The patient denies any falls or other injury that may have contributed to the shift of the fracture.  Past Medical History  Diagnosis Date  . Breast cancer ([place])   . Hypertension   . Diabetes mellitus without complication ([place])   . Allergy   . Depression   . COPD (chronic obstructive pulmonary disease) ([place])   . A-fib ([place])   . CAD (coronary artery disease)   . Chronic diastolic heart failure ([place])   . Pulmonary HTN ([place])   . CKD (chronic kidney disease), stage III    Past Surgical History  Procedure Laterality Date  . Coronary artery bypass graft    . Mastectomy    . Parathyroidectomy    . Appendectomy    . Breast lumpectomy with axillary lymph node dissection    . Hip pinning,cannulated Right 07/10/2015    Procedure: CANNULATED HIP PINNING;  Surgeon: Corky Mull, MD;  Location: [facility] ORS;  Service:  Orthopedics;  Laterality: Right;  right   Social History   Social History  . Marital Status: Single    Spouse Name: N/A  . Number of Children: N/A  . Years of Education: N/A   Social History Main Topics  . Smoking status: Current Some Day Smoker  . Smokeless tobacco: Never Used  . Alcohol Use: Yes     Comment: Occassionally  . Drug Use: No  . Sexual Activity: Not Asked   Other Topics Concern  . None   Social History Narrative   Family History  Problem Relation Age of Onset  . Hypertension Mother   . Diabetes Mother   . Heart disease Father   . Diabetes Father   . Diabetes Sister   . Breast cancer Sister   . Dementia Sister    Allergies  Allergen Reactions  . Bee Venom Shortness Of Breath and Swelling  . Diphenhydramine Other (See Comments)    Reaction:  Unknown   . Morphine Other (See Comments)    Reaction:  Hallucinations   . Phenylalanine Diarrhea and Nausea And Vomiting  . Sulfa Antibiotics Other (See Comments)    Reaction:  Unknown    Prior to Admission medications   Medication Sig Start Date End Date Taking? Authorizing Provider  acetaminophen (TYLENOL) 325 MG tablet Take 650 mg by mouth every 4 (four) hours as needed for mild pain.   Yes Historical Provider, MD  alendronate (FOSAMAX) 70 MG tablet Take 70 mg by mouth once a week. Pt takes on Monday. Take with a full glass of water on an empty  stomach.   Yes Historical Provider, MD  aspirin EC 81 MG tablet Take 81 mg by mouth daily.   Yes Historical Provider, MD  atorvastatin (LIPITOR) 40 MG tablet Take 40 mg by mouth daily.   Yes Historical Provider, MD  bisacodyl (DULCOLAX) 10 MG suppository Place 1 suppository (10 mg total) rectally daily as needed for moderate constipation. 07/13/15  Yes Aldean Jewett, MD  carvedilol (COREG) 25 MG tablet Take 25 mg by mouth 2 (two) times daily.   Yes Historical Provider, MD  cholecalciferol (VITAMIN D) 1000 UNITS tablet Take 1,000 Units by mouth daily.   Yes Historical  Provider, MD  citalopram (CELEXA) 10 MG tablet Take 10 mg by mouth daily.   Yes Historical Provider, MD  EPINEPHrine (EPIPEN JR 2-PAK) 0.15 MG/0.3ML injection Inject 0.15 mg into the muscle as needed for anaphylaxis.   Yes Historical Provider, MD  ferrous sulfate 325 (65 FE) MG tablet Take 1 tablet (325 mg total) by mouth 3 (three) times daily after meals. 07/12/15  Yes Aldean Jewett, MD  fluticasone [facility]) 50 MCG/ACT nasal spray Place 2 sprays into both nostrils daily as needed for rhinitis.   Yes Historical Provider, MD  glipiZIDE (GLUCOTROL) 10 MG tablet Take 10 mg by mouth 2 (two) times daily.   Yes Historical Provider, MD  hydrALAZINE (APRESOLINE) 25 MG tablet Take 25 mg by mouth 2 (two) times daily.   Yes Historical Provider, MD  insulin aspart (NOVOLOG) 100 UNIT/ML injection Inject 3-13 Units into the skin 3 (three) times daily. Pt uses per sliding scale:    60-150:  0 units  151-200:  3 units  201-250:  5 units  251-300:  7 units  301-350:  9 units  351-400:  11 units  401-451:  13 units   Yes Historical Provider, MD  insulin glargine (LANTUS) 100 UNIT/ML injection Inject 0.1 mLs (10 Units total) into the skin at bedtime. Patient taking differently: Inject 12 Units into the skin at bedtime.  07/13/15  Yes Aldean Jewett, MD  isosorbide mononitrate (IMDUR) 30 MG 24 hr tablet Take 30 mg by mouth daily.   Yes Historical Provider, MD  letrozole (FEMARA) 2.5 MG tablet Take 2.5 mg by mouth daily.   Yes Historical Provider, MD  metaxalone (SKELAXIN) 400 MG tablet Take 400 mg by mouth every 4 (four) hours as needed for muscle spasms.   Yes Historical Provider, MD  Multiple Vitamin (MULTIVITAMIN WITH MINERALS) TABS tablet Take 1 tablet by mouth daily.   Yes Historical Provider, MD  omeprazole (PRILOSEC) 20 MG capsule Take 20 mg by mouth 2 (two) times daily.   Yes Historical Provider, MD  oxyCODONE-acetaminophen (ROXICET) 5-325 MG tablet Take 1-2 tablets by mouth every 4 (four) hours  as needed for severe pain. 07/12/15  Yes Aldean Jewett, MD  tiotropium (SPIRIVA) 18 MCG inhalation capsule Place 18 mcg into inhaler and inhale daily.   Yes Historical Provider, MD   Dg Chest 2 View  07/24/2015  CLINICAL DATA:  Altered mental status. EXAM: CHEST  2 VIEW COMPARISON:  April 16, 2011 FINDINGS: The heart size and mediastinal contours are stable. There is no focal infiltrate, pulmonary edema, or pleural effusion. The visualized skeletal structures are stable. IMPRESSION: No active cardiopulmonary disease. Electronically Signed   By: Abelardo Diesel M.D.   On: 07/24/2015 19:57   Ct Head Wo Contrast  07/24/2015  CLINICAL DATA:  Altered mental status EXAM: CT HEAD WITHOUT CONTRAST TECHNIQUE: Contiguous axial images were obtained from  the base of the skull through the vertex without intravenous contrast. COMPARISON:  None. FINDINGS: There is no evidence of mass effect, midline shift, or extra-axial fluid collections. There is no evidence of a space-occupying lesion or intracranial hemorrhage. There is no evidence of a cortical-based area of acute infarction. There is are bilateral small old basal ganglia lacunar infarcts. There is generalized cerebral atrophy. There is periventricular white matter low attenuation likely secondary to microangiopathy. The ventricles and sulci are appropriate for the patient's age. The basal cisterns are patent. Visualized portions of the orbits are unremarkable. The visualized portions of the paranasal sinuses and mastoid air cells are unremarkable. Cerebrovascular atherosclerotic calcifications are noted. The osseous structures are unremarkable. IMPRESSION: No acute intracranial pathology. Electronically Signed   By: Kathreen Devoid   On: 07/24/2015 19:28   Dg Hip Unilat With Pelvis 2-3 Views Right  07/24/2015  CLINICAL DATA:  Right hip fracture status postoperative fixation, postop pain EXAM: DG HIP (WITH OR WITHOUT PELVIS) 2-3V RIGHT COMPARISON:  07/09/2015,  07/10/2015 FINDINGS: 3 fixation screws reduce the proximal right femoral neck fracture. Fracture line remains visible with some residual displacement superiorly. Screws appear intact. Compare to the postoperative films of 07/10/2015, there is slight increased impaction at the fracture. IMPRESSION: Status post fixation of the right femoral neck fracture with evidence of slight interval increased impaction and displacement compared to 07/10/2015. Electronically Signed   By: Jerilynn Mages.  Shick M.D.   On: 07/24/2015 20:05    Positive ROS: All other systems have been reviewed and were otherwise negative with the exception of those mentioned in the HPI and as above.  Physical Exam: General:  Alert, no acute distress Psychiatric:  Normal mood and affect   Cardiovascular:  No pedal edema Respiratory:  No wheezing, non-labored breathing GI:  Abdomen is soft and non-tender Skin:  No lesions in the area of chief complaint Neurologic:  Sensation intact distally Lymphatic:  No axillary or cervical lymphadenopathy  Orthopedic Exam:  Orthopedic examination is limited to the right hip and lower extremity. The patient's right lateral hip wound is well-healed without evidence for infection. Skin inspection otherwise unremarkable. She has pain with any attempted active or passive motion of the right hip. She is able to actively dorsiflex and plantar flex her toes and ankle. Sensation is intact to light touch to her foot and lower leg. She has good capillary refill to her right foot.  X-rays:  X-rays of the pelvis and right hip from last night are available for review. These films demonstrate some vertical translation of the fracture of approximately 4-5 mm with some backing out of the screws. No new fractures or other abnormalities are identified.  Assessment: Loss of reduction status post in situ cannulated screw fixation of impacted right femoral neck fracture.  Plan: Given the shift in the fracture, I feel that the  patient would best benefit from a conversion to a right hip hemiarthroplasty. This procedure will be discussed in detail with the patient's care provider, given the patient's altered mental status. We also will discuss the potential risks (including bleeding, infection, nerve and/or blood vessel injury, persistent or recurrent pain, stiffness, dislocation, leg length inequality, loosening of and/or failure of the components, need for further surgery, blood clots, strokes, heart attacks and/or arrhythmias, etc.) and benefits. The nursing staff will obtain formal written consent from the patient's care provider prior to this procedure. I would like to this procedure later this afternoon, assuming she is cleared medically.  Thank you for  ask me to participate in the care of this most pleasant unfortunate woman. I will be happy to follow her with you.   Pascal Lux, MD  Beeper #:  [phone]  07/25/2015 7:49 AM   I spoke with the patient's daughter and discussed the planned procedure, as well as the potential risks and benefits described above. The patient's daughter is agreeable for the patient to proceed with this operation. She will be in around lunch time in order to sign the consent on behalf of of the patient. Of note, the patient's daughter also notes that the patient has been trying to get up on her own and without assistance on numerous occasions while in the nursing home, and has fallen at least 8 or 10 times, according to the patient's daughter. Most likely, this is what has contributed to the displacement of the fracture.

## 2015-07-25 NOTE — Progress Notes (Signed)
Inpatient Diabetes Program Recommendations  AACE/ADA: New Consensus Statement on Inpatient Glycemic Control (2015)  Target Ranges:  Prepandial:   less than 140 mg/dL      Peak postprandial:   less than 180 mg/dL (1-2 hours)      Critically ill patients:  140 - 180 mg/dL   Review of Glycemic Control  Results for Anita Wilson, Anita Wilson (MRN [id number]) as of 07/25/2015 09:56  Ref. Range 07/25/2015 00:56 07/25/2015 07:22  Glucose-Capillary Latest Ref Range: 65-99 mg/dL 164 (H) 181 (H)    Diabetes history: Type 2 Outpatient Diabetes medications: Glipizide 10mg  bid, Novolog 3-13 units tid, Lantus 12 units qhs Current orders for Inpatient glycemic control: Novolog 0-9 units tid, Lantus 6 units qhs  Inpatient Diabetes Program Recommendations: Agree with current diabetes medication orders.  Will follow.  Gentry Fitz, RN, BA, MHA, CDE Diabetes Coordinator Inpatient Diabetes Program  [phone] (Team Pager) [phone] ([place]) 07/25/2015 9:57 AM

## 2015-07-25 NOTE — Progress Notes (Signed)
Fosamax d/c per policy. Please reorder with discharge medications.

## 2015-07-25 NOTE — Anesthesia Procedure Notes (Signed)
Procedure Name: Intubation Date/Time: 07/25/2015 2:40 PM Performed by: Delaney Meigs Pre-anesthesia Checklist: Patient identified, Emergency Drugs available, Suction available, Patient being monitored and Timeout performed Patient Re-evaluated:Patient Re-evaluated prior to inductionOxygen Delivery Method: Circle system utilized Preoxygenation: Pre-oxygenation with 100% oxygen Intubation Type: IV induction Ventilation: Mask ventilation without difficulty Laryngoscope Size: Mac and 3 Grade View: Grade II Tube type: Oral Tube size: 7.0 mm Number of attempts: 1 Airway Equipment and Method: Stylet Placement Confirmation: ETT inserted through vocal cords under direct vision,  positive ETCO2 and breath sounds checked- equal and bilateral Tube secured with: Tape Dental Injury: Teeth and Oropharynx as per pre-operative assessment

## 2015-07-25 NOTE — Anesthesia Preprocedure Evaluation (Signed)
Anesthesia Evaluation  Patient identified by MRN, date of birth, ID band Patient awake    Reviewed: Allergy & Precautions, H&P , NPO status , Patient's Chart, lab work & pertinent test results, reviewed documented beta blocker date and time   Airway Mallampati: II  TM Distance: >3 FB Neck ROM: full    Dental no notable dental hx. (+) Missing, Loose   Pulmonary neg pulmonary ROS, COPD, Current Smoker,    Pulmonary exam normal        Cardiovascular Exercise Tolerance: Good hypertension, + CAD and +CHF  negative cardio ROS       Neuro/Psych PSYCHIATRIC DISORDERS negative neurological ROS  negative psych ROS   GI/Hepatic negative GI ROS, Neg liver ROS, GERD  ,  Endo/Other  negative endocrine ROSdiabetes  Renal/GU Renal diseasenegative Renal ROS  negative genitourinary   Musculoskeletal   Abdominal   Peds  Hematology negative hematology ROS (+)   Anesthesia Other Findings   Reproductive/Obstetrics negative OB ROS                             Anesthesia Physical  Anesthesia Plan  ASA: III and emergent  Anesthesia Plan: General   Post-op Pain Management:    Induction:   Airway Management Planned:   Additional Equipment:   Intra-op Plan:   Post-operative Plan:   Informed Consent: I have reviewed the patients History and Physical, chart, labs and discussed the procedure including the risks, benefits and alternatives for the proposed anesthesia with the patient or authorized representative who has indicated his/her understanding and acceptance.     Plan Discussed with: CRNA  Anesthesia Plan Comments:         Anesthesia Quick Evaluation

## 2015-07-25 NOTE — Progress Notes (Signed)
SNF and Non-Emergent EMS Transport Benefits:  Number called: [phone] Rep: Desiree Reference Number: [id number]  [place] Complete HMO Plan Two active as of 08/24/14 with no deductible.  Out of pocket max is $6700, of which $1135.82 met so far.  In-network SNF: $0 copay for days 1-20, a $160 daily copay for days 21-62, and a $0 copay for days 63-100.  Once out of pocket is reached, patient covered at 100% for remainder of 100 day benefit period. Per rep, all 100 days available at this time.   $0 copay for professional fees and 3 day hospital stay is not required.  Josem Kaufmann is required: [phone].    Non-emergent EMS transport: $250 copay for each one way medically necessary, Medicare covered trip.  Josem Kaufmann is required: [phone].

## 2015-07-25 NOTE — ED Notes (Signed)
Pt hard to arouse, Dr. Marcille Blanco accessed. Pt arousable with voice and drinking water. MD ordered Norco, see MAR

## 2015-07-26 ENCOUNTER — Encounter: Payer: Self-pay | Admitting: Surgery

## 2015-07-26 LAB — BASIC METABOLIC PANEL
ANION GAP: 8 (ref 5–15)
BUN: 33 mg/dL — ABNORMAL HIGH (ref 6–20)
CO2: 22 mmol/L (ref 22–32)
Calcium: 7.5 mg/dL — ABNORMAL LOW (ref 8.9–10.3)
Chloride: 107 mmol/L (ref 101–111)
Creatinine, Ser: 1.25 mg/dL — ABNORMAL HIGH (ref 0.44–1.00)
GFR, EST AFRICAN AMERICAN: 50 mL/min — AB (ref >60)
GFR, EST NON AFRICAN AMERICAN: 43 mL/min — AB (ref >60)
GLUCOSE: 216 mg/dL — AB (ref 65–99)
POTASSIUM: 5.3 mmol/L — AB (ref 3.5–5.1)
Sodium: 137 mmol/L (ref 135–145)

## 2015-07-26 LAB — GLUCOSE, CAPILLARY
Glucose-Capillary: 176 mg/dL — ABNORMAL HIGH (ref 65–99)
Glucose-Capillary: 254 mg/dL — ABNORMAL HIGH (ref 65–99)
Glucose-Capillary: 182 mg/dL — ABNORMAL HIGH (ref 65–99)
Glucose-Capillary: 188 mg/dL — ABNORMAL HIGH (ref 65–99)

## 2015-07-26 LAB — RPR: RPR Ser Ql: NONREACTIVE

## 2015-07-26 NOTE — Care Management Important Message (Signed)
Important Message  Patient Details  Name: Anita Wilson MRN: [id number] Date of Birth: Jun 19, 1946   Medicare Important Message Given:  Yes    [name] A Dyrell Tuccillo 07/26/2015, 10:41 AM

## 2015-07-26 NOTE — Progress Notes (Signed)
Patient found to have removed teley leads and bracelets.  Explained importance of teley and patient allowed me to place back on.

## 2015-07-26 NOTE — Evaluation (Signed)
Physical Therapy Evaluation Patient Details Name: Anita Wilson MRN: [id number] DOB: 31-Dec-1945 Today's Date: 07/26/2015   History of Present Illness  Pt with recent R hip fx, ORIF, now returns with hip dislocation and needed a hemiathroplasty.   Clinical Impression  Pt is limited with what she is able to do with PT and though she is able to do some AAROM she has some confusion and pain limitations that make full participation difficult.  She is able to attempt some very limited stepping with walker and assist from bed to chair but is neither safe nor confident with this.  She is able to give some effort during supine bed exercises but ultimately is limited by pain and being anxious.      Follow Up Recommendations SNF    Equipment Recommendations       Recommendations for Other Services       Precautions / Restrictions Precautions Precautions: Fall;Posterior Hip Restrictions Weight Bearing Restrictions: Yes RLE Weight Bearing: Weight bearing as tolerated      Mobility  Bed Mobility Overal bed mobility: Needs Assistance Bed Mobility: Supine to Sit     Supine to sit: Mod assist;Max assist     General bed mobility comments: Pt is anxious about getting to EOB but with mod/max assist does get to sitting despite c/o increased pain  Transfers Overall transfer level: Needs assistance Equipment used: Rolling walker (2 wheeled) Transfers: Sit to/from Stand Sit to Stand: Mod assist Stand pivot transfers: Mod assist       General transfer comment: Pt is able to get to standing with assist, but shows   Ambulation/Gait Ambulation/Gait assistance: Mod assist Ambulation Distance (Feet): 3 Feet Assistive device: Rolling walker (2 wheeled)       General Gait Details: Pt able to take a few small, shuffling, unsure, cautious steps with heavy reliance on the walker from bed to recliner.  She is anxious and unsafe with the effort.   Stairs            Wheelchair  Mobility    Modified Rankin (Stroke Patients Only)       Balance                                             Pertinent Vitals/Pain Pain Assessment:  (not rated, indicates min pain at rest, increases w/ movement)    Home Living Family/patient expects to be discharged to:: Skilled nursing facility                      Prior Function           Comments: Pt coming from rehab facility, pt with some confusion and unable to fully answer questions regarding PLOF     Hand Dominance        Extremity/Trunk Assessment   Upper Extremity Assessment: Generalized weakness           Lower Extremity Assessment: RLE deficits/detail RLE Deficits / Details: Pt with pain, hesitancy and general weakness (grossly 2+/5) in R LE, L LE grossly 3+/5       Communication   Communication:  (pt confused)  Cognition Arousal/Alertness: Awake/alert (some confusion) Behavior During Therapy: Impulsive;WFL for tasks assessed/performed Overall Cognitive Status:  (pt does not recall why she is here)  General Comments      Exercises Total Joint Exercises Ankle Circles/Pumps: AROM;Both;10 reps Quad Sets: Strengthening;Right;10 reps Heel Slides: AAROM;10 reps;Right Hip ABduction/ADduction: AAROM;10 reps;Right      Assessment/Plan    PT Assessment Patient needs continued PT services  PT Diagnosis Acute pain;Difficulty walking;Generalized weakness   PT Problem List Decreased strength;Decreased range of motion;Decreased activity tolerance;Decreased balance;Decreased mobility;Decreased cognition;Decreased coordination;Decreased knowledge of use of DME;Decreased safety awareness;Decreased knowledge of precautions;Cardiopulmonary status limiting activity;Decreased skin integrity;Pain  PT Treatment Interventions DME instruction;Gait training;Functional mobility training;Therapeutic activities;Therapeutic exercise;Balance  training;Neuromuscular re-education;Cognitive remediation;Patient/family education   PT Goals (Current goals can be found in the Care Plan section) Acute Rehab PT Goals Patient Stated Goal: control the pain PT Goal Formulation: With patient Time For Goal Achievement: 08/09/15 Potential to Achieve Goals: Fair    Frequency BID   Barriers to discharge        Co-evaluation               End of Session Equipment Utilized During Treatment: Gait belt Activity Tolerance: Patient limited by lethargy;Patient limited by pain Patient left: with chair alarm set;with call bell/phone within reach           Time: 0910-0940 PT Time Calculation (min) (ACUTE ONLY): 30 min   Charges:   PT Evaluation $Initial PT Evaluation Tier I: 1 Procedure PT Treatments $Therapeutic Exercise: 8-22 mins   PT G Codes:       Wayne Both, PT, DPT [phone]  Kreg Shropshire 07/26/2015, 1:17 PM

## 2015-07-26 NOTE — Progress Notes (Signed)
[facility] [place] NAME: Anita Wilson    MR#:  [id number]  DATE OF BIRTH:  1946/04/28  SUBJECTIVE:  CHIEF COMPLAINT:   Chief Complaint  Patient presents with  . Altered Mental Status    care of his severe pain on the right hip, noted subluxation.  s/p hemiarthroplasty 07/25/15- remains alert, but confused.     REVIEW OF SYSTEMS:  Not able to provide any review of system because of mental status change. ROS  DRUG ALLERGIES:   Allergies  Allergen Reactions  . Bee Venom Shortness Of Breath and Swelling  . Diphenhydramine Other (See Comments)    Reaction:  Unknown   . Morphine Other (See Comments)    Reaction:  Hallucinations   . Phenylalanine Diarrhea and Nausea And Vomiting  . Sulfa Antibiotics Other (See Comments)    Reaction:  Unknown     VITALS:  Blood pressure 167/71, pulse 98, temperature 99.1 F (37.3 C), temperature source Oral, resp. rate 18, height 5\' 4"  (1.626 m), weight 48.081 kg (106 lb), SpO2 99 %.  PHYSICAL EXAMINATION:  Physical Exam  Constitutional: No distress.     Constitutional: She appears well-developed and well-nourished. HENT:  Head: Normocephalic and atraumatic.  Mouth/Throat: Oropharynx is clear and moist.  Eyes: Conjunctivae and EOM are normal. Pupils are equal, round, and reactive to light. No scleral icterus.  Neck: Normal range of motion. Neck supple. No JVD present. No tracheal deviation present. No thyromegaly present.  Cardiovascular: Normal rate, regular rhythm and normal heart sounds. Exam reveals no gallop and no friction rub.  No murmur heard. Respiratory: Effort normal and breath sounds normal.  GI: Soft. Bowel sounds are normal. She exhibits no distension. There is no tenderness.  Genitourinary:  deferred  Musculoskeletal: Normal range of motion. She exhibits no edema.  S/p surgical dressing right hip. Lymphadenopathy:   She has no cervical adenopathy.   Neurological: She is alert. No cranial nerve deficit. She exhibits normal muscle tone.  Skin: Skin is warm and dry. She is not diaphoretic.  Psychiatric:  Patient is delirious  LABORATORY PANEL:   CBC  Recent Labs Lab 07/25/15 0100  WBC 5.3  HGB 10.4*  HCT 31.3*  PLT 243   ------------------------------------------------------------------------------------------------------------------  Chemistries   Recent Labs Lab 07/24/15 1846  07/26/15 0416  NA 137  < > 137  K 4.8  < > 5.3*  CL 100*  < > 107  CO2 29  < > 22  GLUCOSE 231*  < > 216*  BUN 38*  < > 33*  CREATININE 1.25*  < > 1.25*  CALCIUM 8.8*  < > 7.5*  AST 20  --   --   ALT 16  --   --   ALKPHOS 111  --   --   BILITOT 0.4  --   --   < > = values in this interval not displayed. ------------------------------------------------------------------------------------------------------------------  Cardiac Enzymes  Recent Labs Lab 07/24/15 1846  TROPONINI <0.03   ------------------------------------------------------------------------------------------------------------------  RADIOLOGY:  Dg Pelvis 1-2 Views  07/25/2015  CLINICAL DATA:  Postop RIGHT hip arthroplasty EXAM: PELVIS - 1-2 VIEW COMPARISON:  None. FINDINGS: RIGHT hip total arthroplasty noted. No evidence fracture dislocation. Joint space narrowing of the LEFT hip. IMPRESSION: No complication following RIGHT hip arthroplasty. Electronically Signed   By: Suzy Bouchard M.D.   On: 07/25/2015 17:47    ASSESSMENT AND PLAN:   Active Problems:   Hip subluxation ([place])  This is  a 69 year old Caucasian female admitted for delirium and right hip arthroplasty revision due to subluxation/hardware derangement. 1. Delirium: Possibly secondary to dehydration given acute on chronic kidney injury  Checked for reversible causes of delirium: TSHas  and RPR.  Avoid benzodiazepines.  I spoke to her daughter on phone- as per her- before the previous admission- pt was  living at home alone- and was completely alert and oriented. Since the last admission in hospital- she is confused.  CT head and infection work ups negative.   Called neurology consult. 2. Acute on chronic kidney injury: Creatinine increased above baseline. Hydrate with intravenous fluid. Avoid nephrotoxic agents. Slightly improved. 3. Right hip screw dislocation-    S/p hemiarthroplasty 07/25/15.   Need rehab. 4. CAD: Status post CABG. No indication the patient has cardiac chest pain.  Continue Imdur 5. Hypertension: Uncontrolled at this time likely secondary to pain. Continue home medications. Hydralazine when necessary. 6. Diabetes mellitus type 2: Hold oral hypoglycemic agents. Continue basal insulin. Add sliding scale insulin while the patient is hospitalized. 7. COPD: Continue Spiriva 8. DVT prophylaxis: SCDs. Start heparin following surgery. 9. GI prophylaxis: Pantoprazole.   All the records are reviewed and case discussed with Care Management/Social Workerr. Management plans discussed with the family and they are in agreement.  CODE STATUS: Full code  TOTAL TIME TAKING CARE OF THIS PATIENT: 35 minutes.   POSSIBLE D/C IN 2-3 DAYS, DEPENDING ON CLINICAL CONDITION.   Vaughan Basta M.D on 07/26/2015   Between 7am to 6pm - Pager - [phone]  After 6pm go to [email or web address] - password [email or web address] [facility]  Office  [phone]  CC: Primary care physician; Alessandra Grout, MD  Note: This dictation was prepared with Dragon dictation along with smaller phrase technology. Any transcriptional errors that result from this process are unintentional.

## 2015-07-26 NOTE — Progress Notes (Signed)
Pt is very confused.  She just wants to leave.  She also thinks she is here to have a baby.  She is throwing items from her meal tray onto the floor.

## 2015-07-26 NOTE — Progress Notes (Signed)
  Subjective: 1 Day Post-Op Procedure(s) (LRB): ARTHROPLASTY BIPOLAR HIP (HEMIARTHROPLASTY) (Right) Patient reports pain as 6 on 0-10 scale.   Patient is well, but has had some minor complaints of pain and itching Plan is to go Rehab after hospital stay. Negative for chest pain and shortness of breath Fever: no Gastrointestinal:Positive for mild nausea and vomiting  Objective: Vital signs in last 24 hours: Temp:  [96.1 F (35.6 C)-98.3 F (36.8 C)] 98.1 F (36.7 C) (12/02 0753) Pulse Rate:  [73-101] 96 (12/02 0753) Resp:  [14-20] 20 (12/02 0753) BP: (126-198)/(45-87) 147/66 mmHg (12/02 0753) SpO2:  [94 %-100 %] 100 % (12/02 0753) Weight:  [48.081 kg (106 lb)] 48.081 kg (106 lb) (12/01 1438)  Intake/Output from previous day:  Intake/Output Summary (Last 24 hours) at 07/26/15 0755 Last data filed at 07/26/15 0345  Gross per 24 hour  Intake   2100 ml  Output   1050 ml  Net   1050 ml    Intake/Output this shift:    Labs:  Recent Labs  07/24/15 1846 07/25/15 0100  HGB 11.8* 10.4*    Recent Labs  07/24/15 1846 07/25/15 0100  WBC 6.8 5.3  RBC 3.68* 3.31*  HCT 34.8* 31.3*  PLT 276 243    Recent Labs  07/25/15 0100 07/26/15 0416  NA 138 137  K 4.6 5.3*  CL 102 107  CO2 29 22  BUN 33* 33*  CREATININE 1.01* 1.25*  GLUCOSE 181* 216*  CALCIUM 8.4* 7.5*   No results for input(s): LABPT, INR in the last 72 hours.   EXAM General - Patient is Alert, Oriented and sightly confused. Extremity - ABD soft Sensation intact distally Intact pulses distally Dorsiflexion/Plantar flexion intact Incision: dressing C/D/I Dressing/Incision - clean, dry, no drainage Motor Function - intact, moving foot and toes well on exam.   Past Medical History  Diagnosis Date  . Breast cancer ([place])   . Hypertension   . Diabetes mellitus without complication ([place])   . Allergy   . Depression   . COPD (chronic obstructive pulmonary disease) ([place])   . A-fib ([place])   . CAD  (coronary artery disease)   . Chronic diastolic heart failure ([place])   . Pulmonary HTN ([place])   . CKD (chronic kidney disease), stage III     Assessment/Plan: 1 Day Post-Op Procedure(s) (LRB): ARTHROPLASTY BIPOLAR HIP (HEMIARTHROPLASTY) (Right) Active Problems:   Hip subluxation ([facility])  Estimated body mass index is 18.19 kg/(m^2) as calculated from the following:   Height as of this encounter: 5\' 4"  (1.626 m).   Weight as of this encounter: 48.081 kg (106 lb). Advance Diet Up with therapy  K+ 5.3 this morning, likely due to hemolysis.  Re-check BMP tomorrow morning.  DVT Prophylaxis - Lovenox, Foot Pumps and TED hose Weight-Bearing as tolerated to right leg  J. Cameron Proud, PA-C [facility] Orthopaedic [facility] 07/26/2015, 7:55 AM

## 2015-07-26 NOTE — Plan of Care (Signed)
Problem: Education: Goal: Knowledge of [place] [facility] Education information/materials will improve Outcome: Progressing Patient is confused & cannot understand the education at this time.

## 2015-07-26 NOTE — Care Management (Signed)
Patient is confused this admission post hemiarthorplasty- pulling out IV lines and telemetry leads. She was calmer when I rounded and she stated that she was from [facility] for rehab and plans to return at discharge. No family present.

## 2015-07-26 NOTE — Progress Notes (Signed)
Pt pulled out IV. Dr Lavetta Nielsen notified.

## 2015-07-26 NOTE — Progress Notes (Signed)
Physical Therapy Treatment Patient Details Name: Anita Wilson MRN: [id number] DOB: [birth year] Today's Date: 07/26/2015    History of Present Illness Pt with recent R hip fx, ORIF, now returns with hip dislocation and needed a hemiathroplasty.     PT Comments    Pt struggles with PT session this afternoon.  She is confused and though she appears vaguely to understand why she is here she perseverates on why her hip his sore/hurting and rambles on about the (seemingly moderate) pain and essentially needs constant cuing and encouragement to participate with even very light AAROM exercises.  She is generally unable to string more than a rep or 2 together before she gets off topic and discusses difficulty with moving her R hip/leg.  Follow Up Recommendations  SNF     Equipment Recommendations       Recommendations for Other Services       Precautions / Restrictions Precautions Precautions: Fall;Posterior Hip Restrictions Weight Bearing Restrictions: Yes RLE Weight Bearing: Weight bearing as tolerated    Mobility  Bed Mobility Overal bed mobility:  (struggles w/ exercises and effectively refuses getting up) Bed Mobility: Supine to Sit     Supine to sit: Mod assist;Max assist     General bed mobility comments: Pt is anxious about getting to EOB but with mod/max assist does get to sitting despite c/o increased pain  Transfers Overall transfer level: Needs assistance Equipment used: Rolling walker (2 wheeled) Transfers: Sit to/from Stand Sit to Stand: Mod assist Stand pivot transfers: Mod assist       General transfer comment: Pt is able to get to standing with assist, but shows   Ambulation/Gait Ambulation/Gait assistance: Mod assist Ambulation Distance (Feet): 3 Feet Assistive device: Rolling walker (2 wheeled)       General Gait Details: Pt able to take a few small, shuffling, unsure, cautious steps with heavy reliance on the walker from bed to recliner.   She is anxious and unsafe with the effort.    Stairs            Wheelchair Mobility    Modified Rankin (Stroke Patients Only)       Balance                                    Cognition Arousal/Alertness: Lethargic Behavior During Therapy: Anxious;Agitated;Restless Overall Cognitive Status: Difficult to assess (pt more confused this afternoon )                      Exercises Total Joint Exercises Ankle Circles/Pumps: AROM;Both;10 reps Quad Sets: Right;10 reps;AAROM Short Arc QuadSinclair Wilson;Right;5 reps Heel Slides: AAROM;15 reps;Right Hip ABduction/ADduction: AAROM;10 reps;Right    General Comments        Pertinent Vitals/Pain Pain Assessment:  (unrated, less toleration for AAROM acts this afternoon)    Home Living Family/patient expects to be discharged to:: Skilled nursing facility                    Prior Function        Comments: Pt coming from rehab facility, pt with some confusion and unable to fully answer questions regarding PLOF   PT Goals (current goals can now be found in the care plan section) Acute Rehab PT Goals Patient Stated Goal: control the pain PT Goal Formulation: With patient Time For Goal Achievement: 08/09/15 Potential to Achieve Goals: Fair  Frequency  BID    PT Plan Current plan remains appropriate    Co-evaluation             End of Session Equipment Utilized During Treatment: Gait belt Activity Tolerance: Patient limited by pain;Treatment limited secondary to agitation Patient left: with bed alarm set     Time: 1325-1350 PT Time Calculation (min) (ACUTE ONLY): 25 min  Charges:  $Therapeutic Exercise: 23-37 mins                    G Codes:      Anita Wilson 08-15-2015, 2:57 PM

## 2015-07-27 ENCOUNTER — Inpatient Hospital Stay: Payer: Medicare Other

## 2015-07-27 DIAGNOSIS — I10 Essential (primary) hypertension: Secondary | ICD-10-CM

## 2015-07-27 DIAGNOSIS — I480 Paroxysmal atrial fibrillation: Secondary | ICD-10-CM

## 2015-07-27 DIAGNOSIS — E785 Hyperlipidemia, unspecified: Secondary | ICD-10-CM

## 2015-07-27 DIAGNOSIS — I5042 Chronic combined systolic (congestive) and diastolic (congestive) heart failure: Secondary | ICD-10-CM

## 2015-07-27 LAB — BASIC METABOLIC PANEL
ANION GAP: 8 (ref 5–15)
BUN: 40 mg/dL — ABNORMAL HIGH (ref 6–20)
CALCIUM: 8.1 mg/dL — AB (ref 8.9–10.3)
CO2: 25 mmol/L (ref 22–32)
Chloride: 107 mmol/L (ref 101–111)
Creatinine, Ser: 1.36 mg/dL — ABNORMAL HIGH (ref 0.44–1.00)
GFR, EST AFRICAN AMERICAN: 45 mL/min — AB (ref >60)
GFR, EST NON AFRICAN AMERICAN: 39 mL/min — AB (ref >60)
GLUCOSE: 257 mg/dL — AB (ref 65–99)
POTASSIUM: 4.6 mmol/L (ref 3.5–5.1)
SODIUM: 140 mmol/L (ref 135–145)

## 2015-07-27 LAB — GLUCOSE, CAPILLARY
Glucose-Capillary: 237 mg/dL — ABNORMAL HIGH (ref 65–99)
Glucose-Capillary: 249 mg/dL — ABNORMAL HIGH (ref 65–99)
Glucose-Capillary: 273 mg/dL — ABNORMAL HIGH (ref 65–99)
Glucose-Capillary: 185 mg/dL — ABNORMAL HIGH (ref 65–99)

## 2015-07-27 MED ORDER — OXYCODONE HCL 5 MG PO TABS: 5.0000 mg | ORAL_TABLET | ORAL | Status: DC | PRN

## 2015-07-27 MED ORDER — DILTIAZEM HCL 60 MG PO TABS
30.0000 mg | ORAL_TABLET | Freq: Four times a day (QID) | ORAL | Status: DC
  Administered 2015-07-27 (×2): 30 mg via ORAL
  Filled 2015-07-27 (×3): qty 1

## 2015-07-27 MED ORDER — ENOXAPARIN SODIUM 40 MG/0.4ML ~~LOC~~ SOLN: 40.0000 mg | SUBCUTANEOUS | Status: DC

## 2015-07-27 MED ORDER — SODIUM CHLORIDE 0.9 % IV SOLN
INTRAVENOUS | Status: DC
  Administered 2015-07-27: 10:00:00 via INTRAVENOUS

## 2015-07-27 NOTE — Progress Notes (Signed)
Physical Therapy Treatment Patient Details Name: Anita Wilson MRN: [id number] DOB: April 03, 1946 Today's Date: 07/27/2015    History of Present Illness Pt with recent R hip fx, ORIF, now returns with hip dislocation and needed a hemiathroplasty.     PT Comments    Pt continues to be confused and though she knows she did something to her hip she does not remember why she is here or what the last 2 weeks have entailed.  She shows some effort with exercises and c/o less pain with them today but is still very pain limited and needs a lot of cuing just to stay on task.    Follow Up Recommendations  SNF     Equipment Recommendations       Recommendations for Other Services       Precautions / Restrictions Precautions Precautions: Fall;Posterior Hip Restrictions Weight Bearing Restrictions: Yes RLE Weight Bearing: Weight bearing as tolerated    Mobility  Bed Mobility Overal bed mobility: Needs Assistance Bed Mobility: Supine to Sit     Supine to sit: Mod assist     General bed mobility comments: Pt shows somewhat more effort getting up to sitting, but remains confused and hesitant to really do much participation  Transfers Overall transfer level: Needs assistance Equipment used: Rolling walker (2 wheeled) Transfers: Sit to/from Stand Sit to Stand: Mod assist Stand pivot transfers: Mod assist       General transfer comment: Pt continues to struggle with getting to standing and is anxious and c/o pain during transition.   Ambulation/Gait Ambulation/Gait assistance: Mod assist;Max assist Ambulation Distance (Feet): 3 Feet Assistive device: Rolling walker (2 wheeled)       General Gait Details: Pt again only able to take a few small, shuffling steps, but she is more upright and better able to use the walker to support her weight during bed to chair this AM.   Stairs            Wheelchair Mobility    Modified Rankin (Stroke Patients Only)        Balance                                    Cognition Arousal/Alertness: Awake/alert Behavior During Therapy: Anxious;Restless Overall Cognitive Status:  (continues to report that she is confused about her situation)                      Exercises Total Joint Exercises Ankle Circles/Pumps: AROM;Both;10 reps Quad Sets: Right;10 reps;AAROM Gluteal Sets: Right;Strengthening;10 reps Short Arc Quad: AAROM;Right;5 reps Heel Slides: AAROM;15 reps;Right Hip ABduction/ADduction: AAROM;10 reps;Right    General Comments        Pertinent Vitals/Pain Pain Assessment:  (she has less severe reaction to minimal movement today) Pain Score:  (pt is unable to rate)    Home Living                      Prior Function            PT Goals (current goals can now be found in the care plan section) Progress towards PT goals: Progressing toward goals    Frequency  BID    PT Plan Current plan remains appropriate    Co-evaluation             End of Session Equipment Utilized During Treatment: Gait belt Activity Tolerance: Patient limited  by pain;Treatment limited secondary to agitation Patient left: with chair alarm set     Time: [id number] PT Time Calculation (min) (ACUTE ONLY): 25 min  Charges:  $Gait Training: 8-22 mins $Therapeutic Exercise: 8-22 mins                    G Codes:     Wayne Both, PT, DPT [phone]  Kreg Shropshire 07/27/2015, 12:49 PM

## 2015-07-27 NOTE — Progress Notes (Signed)
  Subjective: 2 Days Post-Op Procedure(s) (LRB): ARTHROPLASTY BIPOLAR HIP (HEMIARTHROPLASTY) (Right) Patient reports pain as mild.   Patient seen in rounds with Dr. Roland Rack. Patient is doing okay although she did have atrial fibrillation in the night. Plan is to go Skilled nursing facility after hospital stay. Negative for chest pain and shortness of breath Fever: no Gastrointestinal: Negative for nausea and vomiting  Objective: Vital signs in last 24 hours: Temp:  [97.4 F (36.3 C)-99.1 F (37.3 C)] 98 F (36.7 C) (12/03 0441) Pulse Rate:  [81-103] 81 (12/03 0441) Resp:  [17-20] 18 (12/03 0441) BP: (128-167)/(46-71) 145/62 mmHg (12/03 0455) SpO2:  [95 %-100 %] 97 % (12/03 0441) Weight:  [55.883 kg (123 lb 3.2 oz)] 55.883 kg (123 lb 3.2 oz) (12/03 0500)  Intake/Output from previous day:  Intake/Output Summary (Last 24 hours) at 07/27/15 0624 Last data filed at 07/26/15 1300  Gross per 24 hour  Intake      0 ml  Output    125 ml  Net   -125 ml    Intake/Output this shift:    Labs:  Recent Labs  07/24/15 1846 07/25/15 0100  HGB 11.8* 10.4*    Recent Labs  07/24/15 1846 07/25/15 0100  WBC 6.8 5.3  RBC 3.68* 3.31*  HCT 34.8* 31.3*  PLT 276 243    Recent Labs  07/26/15 0416 07/27/15 0444  NA 137 140  K 5.3* 4.6  CL 107 107  CO2 22 25  BUN 33* 40*  CREATININE 1.25* 1.36*  GLUCOSE 216* 257*  CALCIUM 7.5* 8.1*   No results for input(s): LABPT, INR in the last 72 hours.   EXAM General - Patient is Alert and Oriented Extremity - Neurovascular intact Dorsiflexion/Plantar flexion intact No cellulitis present Compartment soft Dressing/Incision - clean, dry, no drainage Motor Function - intact, moving foot and toes well on exam. The patient ambulated several feet in the room.  Past Medical History  Diagnosis Date  . Breast cancer ([place])   . Hypertension   . Diabetes mellitus without complication ([place])   . Allergy   . Depression   . COPD (chronic  obstructive pulmonary disease) ([place])   . A-fib ([place])   . CAD (coronary artery disease)   . Chronic diastolic heart failure ([place])   . Pulmonary HTN ([place])   . CKD (chronic kidney disease), stage III     Assessment/Plan: 2 Days Post-Op Procedure(s) (LRB): ARTHROPLASTY BIPOLAR HIP (HEMIARTHROPLASTY) (Right) Active Problems:   Hip subluxation (HCC)  Estimated body mass index is 21.14 kg/(m^2) as calculated from the following:   Height as of this encounter: 5\' 4"  (1.626 m).   Weight as of this encounter: 55.883 kg (123 lb 3.2 oz). Up with therapy  DVT Prophylaxis - Lovenox, Foot Pumps and TED hose Weight-Bearing as tolerated to Right leg  Reche Dixon, PA-C Orthopaedic Surgery 07/27/2015, 6:24 AM

## 2015-07-27 NOTE — Plan of Care (Addendum)
RN assessment has noted that pt is inconsistent with being "comfortable" with swallowing.  Meals have been "picked at" all day and last meds given caused the pt to "choke" and cough.  RN placing on aspiration precautions and notifying Dr. For possible SLP consult. Dr. Hulen Skains back and stated he'd place orders for stat CXR, NPO, SLP assess/treat and aspiration precautions. - Pt informed.

## 2015-07-27 NOTE — Consult Note (Signed)
Patient ID: Anita Wilson MRN: [id number] DOB/AGE: 1945/11/08 69 y.o.  Admit date: 07/24/2015 Primary Physician Alessandra Grout, MD Primary Cardiologist Dr. Chong Sicilian, [facility]   Chief Complaint  atrial fibrillation  HPI:  [name] is a 69 year old woman with hypertension, CAD s/p CABG, paroxysmal atrial fibrillation, diabetes mellitus type 2, and chronic systolic and diastolic heart failure who presented for subluxation of the right hip and is now status post hemiarthroplasty on 07/25/2015. Cardiology is consulted for management of her atrial fibrillation.  [name] initially presented to the emergency department from her nursing home where she had been recuperating after her right hip for sleep. They noticed a change in her mental status and crying pain. He was brought to the ED where it was noted that her recent pinning procedure of the right hip had been compromised and there was vertical translation of the fracture by approximately 4-5 mm. She was taken back to the OR on 12/14 operative repair.   [name] typically receives her care at [facility]. She was diagnosed with atrial fibrillation in March 2005. She also has known diastolic heart failure.  Her echo 06/2014 revealed LVEF 45-50% with grade 1  diastolic dysfunction. In 2013 LVEF was 30-35%. She is followed by Dr. Chong Sicilian at [facility].  She does not feel when she is in atrial fibrillation and currently denies any chest pain, palpitations, shortness of breath, lightheadedness or dizziness. In the last 24 hours she has been in and out of atrial fibrillation with rates up into the 170s she remains asymptomatic throughout. She does report frequent falls and therefore has not been on anticoagulation for her atrial fibrillation.  Review of Systems:     Cardiac Review of Systems: {Y] = yes [ ]  = no  Chest Pain [    ]  Resting SOB [   ] Exertional SOB  [  ]  Orthopnea [  ]   Pedal Edema [   ]      Palpitations [  ] Syncope  [  ]   Presyncope [   ]  General Review of Systems: [Y] = yes [  ]=no Constitional: recent weight change [ x ]; anorexia [  ]; fatigue [  ]; nausea [  ]; night sweats [  ]; fever [  ]; or chills [  ];                                                                      Eyes : blurred vision [  ]; diplopia [   ]; vision changes [  ];  Amaurosis fugax[  ]; Resp: cough [  ];  wheezing[  ];  hemoptysis[  ];  PND [  ];  GI:  gallstones[  ], vomiting[  ];  dysphagia[  ]; melena[  ];  hematochezia [  ]; heartburn[  ];   GU: kidney stones [  ]; hematuria[  ];   dysuria [  ];  nocturia[  ]; incontinence [  ];             Skin: rash, swelling[  ];, hair loss[  ];  peripheral edema[  ];  or itching[  ]; Musculosketetal: myalgias[  ];  joint swelling[  ];  joint erythema[  ];  joint pain[ x ];  back pain[  ];  Heme/Lymph: bruising[  ];  bleeding[  ];  anemia[  ];  Neuro: TIA[  ];  headaches[  ];  stroke[  ];  vertigo[  ];  seizures[  ];   paresthesias[  ];  difficulty walking[x  ];   Psych:depression[  ]; anxiety[  ];  Endocrine: diabetes[  ];  thyroid dysfunction[  ];  Other:  Past Medical History  Diagnosis Date  . Breast cancer ([place])   . Hypertension   . Diabetes mellitus without complication ([place])   . Allergy   . Depression   . COPD (chronic obstructive pulmonary disease) ([place])   . A-fib ([place])   . CAD (coronary artery disease)   . Chronic diastolic heart failure ([place])   . Pulmonary HTN ([place])   . CKD (chronic kidney disease), stage III     Medications Prior to Admission  Medication Sig Dispense Refill  . acetaminophen (TYLENOL) 325 MG tablet Take 650 mg by mouth every 4 (four) hours as needed for mild pain.    Marland Kitchen alendronate (FOSAMAX) 70 MG tablet Take 70 mg by mouth once a week. Pt takes on Monday. Take with a full glass of water on an empty stomach.    Marland Kitchen aspirin EC 81 MG tablet Take 81 mg by mouth daily.    Marland Kitchen atorvastatin (LIPITOR) 40 MG tablet Take 40 mg by  mouth daily.    . bisacodyl (DULCOLAX) 10 MG suppository Place 1 suppository (10 mg total) rectally daily as needed for moderate constipation. 12 suppository 0  . carvedilol (COREG) 25 MG tablet Take 25 mg by mouth 2 (two) times daily.    . cholecalciferol (VITAMIN D) 1000 UNITS tablet Take 1,000 Units by mouth daily.    . citalopram (CELEXA) 10 MG tablet Take 10 mg by mouth daily.    Marland Kitchen EPINEPHrine (EPIPEN JR 2-PAK) 0.15 MG/0.3ML injection Inject 0.15 mg into the muscle as needed for anaphylaxis.    . ferrous sulfate 325 (65 FE) MG tablet Take 1 tablet (325 mg total) by mouth 3 (three) times daily after meals. 90 tablet 3  . fluticasone (FLONASE) 50 MCG/ACT nasal spray Place 2 sprays into both nostrils daily as needed for rhinitis.    Marland Kitchen glipiZIDE (GLUCOTROL) 10 MG tablet Take 10 mg by mouth 2 (two) times daily.    . hydrALAZINE (APRESOLINE) 25 MG tablet Take 25 mg by mouth 2 (two) times daily.    . insulin aspart (NOVOLOG) 100 UNIT/ML injection Inject 3-13 Units into the skin 3 (three) times daily. Pt uses per sliding scale:    60-150:  0 units  151-200:  3 units  201-250:  5 units  251-300:  7 units  301-350:  9 units  351-400:  11 units  401-451:  13 units    . insulin glargine (LANTUS) 100 UNIT/ML injection Inject 0.1 mLs (10 Units total) into the skin at bedtime. (Patient taking differently: Inject 12 Units into the skin at bedtime. ) 10 mL 11  . isosorbide mononitrate (IMDUR) 30 MG 24 hr tablet Take 30 mg by mouth daily.    Marland Kitchen letrozole (FEMARA) 2.5 MG tablet Take 2.5 mg by mouth daily.    . metaxalone (SKELAXIN) 400 MG tablet Take 400 mg by mouth every 4 (four) hours as needed for muscle spasms.    . Multiple Vitamin (MULTIVITAMIN WITH MINERALS) TABS tablet Take 1 tablet by mouth  daily.    . omeprazole (PRILOSEC) 20 MG capsule Take 20 mg by mouth 2 (two) times daily.    Marland Kitchen oxyCODONE-acetaminophen (ROXICET) 5-325 MG tablet Take 1-2 tablets by mouth every 4 (four) hours as needed for  severe pain. 15 tablet 0  . tiotropium (SPIRIVA) 18 MCG inhalation capsule Place 18 mcg into inhaler and inhale daily.       Marland Kitchen aspirin EC  81 mg Oral Daily  . atorvastatin  40 mg Oral Daily  . carvedilol  25 mg Oral BID  . cholecalciferol  1,000 Units Oral Daily  . citalopram  10 mg Oral Daily  . docusate sodium  100 mg Oral BID  . enoxaparin (LOVENOX) injection  40 mg Subcutaneous Q24H  . ferrous sulfate  325 mg Oral TID PC  . hydrALAZINE  25 mg Oral BID  . Influenza vac split quadrivalent PF  0.5 mL Intramuscular Tomorrow-1000  . insulin aspart  0-9 Units Subcutaneous TID WC  . insulin glargine  6 Units Subcutaneous QHS  . isosorbide mononitrate  30 mg Oral Daily  . letrozole  2.5 mg Oral Daily  . multivitamin with minerals  1 tablet Oral Daily  . pantoprazole  40 mg Oral Daily  . pneumococcal 23 valent vaccine  0.5 mL Intramuscular Tomorrow-1000  . sodium chloride  3 mL Intravenous Q12H  . tiotropium  18 mcg Inhalation Daily    Infusions: . sodium chloride 75 mL/hr at 07/27/15 0936    Allergies  Allergen Reactions  . Bee Venom Shortness Of Breath and Swelling  . Diphenhydramine Other (See Comments)    Reaction:  Unknown   . Morphine Other (See Comments)    Reaction:  Hallucinations   . Phenylalanine Diarrhea and Nausea And Vomiting  . Sulfa Antibiotics Other (See Comments)    Reaction:  Unknown     Social History   Social History  . Marital Status: Single    Spouse Name: N/A  . Number of Children: N/A  . Years of Education: N/A   Occupational History  . Not on file.   Social History Main Topics  . Smoking status: Current Some Day Smoker  . Smokeless tobacco: Never Used  . Alcohol Use: Yes     Comment: Occassionally  . Drug Use: No  . Sexual Activity: Not on file   Other Topics Concern  . Not on file   Social History Narrative    Family History  Problem Relation Age of Onset  . Hypertension Mother   . Diabetes Mother   . Heart disease Father     . Diabetes Father   . Diabetes Sister   . Breast cancer Sister   . Dementia Sister     PHYSICAL EXAM: Filed Vitals:   07/27/15 0751 07/27/15 0930  BP: 147/63 138/74  Pulse: 85   Temp: 97.5 F (36.4 C)   Resp: 18      Intake/Output Summary (Last 24 hours) at 07/27/15 1111 Last data filed at 07/27/15 0900  Gross per 24 hour  Intake    240 ml  Output      0 ml  Net    240 ml    General:  Well appearing. No respiratory difficulty HEENT: normal Neck: supple. JVP 2 cm above the clavicle at 45. Carotids 2+ bilat; no bruits. No lymphadenopathy or thryomegaly appreciated. Cor: PMI nondisplaced. Irregularly irregular. 2/6 systolic murmur at the left upper sternal border.. No rubs or gallops. Lungs: clear Abdomen: soft, nontender, nondistended. No hepatosplenomegaly.  No bruits or masses. Good bowel sounds. Extremities: no cyanosis, clubbing, rash, edema Neuro: alert & oriented x 3, cranial nerves grossly intact. moves all 4 extremities w/o difficulty. Affect pleasant.  Results for orders placed or performed during the hospital encounter of 07/24/15 (from the past 24 hour(s))  Glucose, capillary     Status: Abnormal   Collection Time: 07/26/15 11:44 AM  Result Value Ref Range   Glucose-Capillary 254 (H) 65 - 99 mg/dL  Glucose, capillary     Status: Abnormal   Collection Time: 07/26/15  4:51 PM  Result Value Ref Range   Glucose-Capillary 182 (H) 65 - 99 mg/dL   Comment 1 Notify RN   Glucose, capillary     Status: Abnormal   Collection Time: 07/26/15  9:43 PM  Result Value Ref Range   Glucose-Capillary 188 (H) 65 - 99 mg/dL   Comment 1 Notify RN   Basic metabolic panel     Status: Abnormal   Collection Time: 07/27/15  4:44 AM  Result Value Ref Range   Sodium 140 135 - 145 mmol/L   Potassium 4.6 3.5 - 5.1 mmol/L   Chloride 107 101 - 111 mmol/L   CO2 25 22 - 32 mmol/L   Glucose, Bld 257 (H) 65 - 99 mg/dL   BUN 40 (H) 6 - 20 mg/dL   Creatinine, Ser 1.36 (H) 0.44 - 1.00  mg/dL   Calcium 8.1 (L) 8.9 - 10.3 mg/dL   GFR calc non Af Amer 39 (L) >60 mL/min   GFR calc Af Amer 45 (L) >60 mL/min   Anion gap 8 5 - 15  Glucose, capillary     Status: Abnormal   Collection Time: 07/27/15  7:53 AM  Result Value Ref Range   Glucose-Capillary 237 (H) 65 - 99 mg/dL   Comment 1 Notify RN    Dg Pelvis 1-2 Views  07/25/2015  CLINICAL DATA:  Postop RIGHT hip arthroplasty EXAM: PELVIS - 1-2 VIEW COMPARISON:  None. FINDINGS: RIGHT hip total arthroplasty noted. No evidence fracture dislocation. Joint space narrowing of the LEFT hip. IMPRESSION: No complication following RIGHT hip arthroplasty. Electronically Signed   By: Suzy Bouchard M.D.   On: 07/25/2015 17:47    Echo 04/15/11: LVEF 30-35%. Diffuse hypokinesis. Moderate mitral regurgitation. Mild left atrial dilatation. RV moderately dilated. RA mildly dilated. Moderate tricuspid regurgitation. PASD 33 mmHg.  ECG:  Sinus rhythm rate 76 bpm. Right bundle branch block. Telemetry: Sinus rhythm and atrial fibrillation with rates up to 170 8/m.  ASSESSMENT/PLAN:  # Paroxysmal atrial fibrillation: Ms. Gehrmann has known atrial fibrillation. Postoperatively she has had poorly controlled A. fib with rates up into the 170s. She is completely asymptomatic. Given that her blood pressure is also poorly controlled, we will not switch carvedilol to metoprolol. We will add diltiazem 30 mg every hours and consolidate once we reach an appropriate dose. She has suffered several falls and is a high falls risk in the future. Therefore, we will not start anticoagulation despite her CHA2DS2-Vasc score of 3.  Given that this is not a new diagnosis and she does not have any evidence of heart failure on exam, we will not obtain a repeat echocardiogram at this time.  This patients CHA2DS2-VASc Score and unadjusted Ischemic Stroke Rate (% per year) is equal to 3.2 % stroke rate/year from a score of 3  Above score calculated as 1 point each if present  [CHF, HTN, DM, Vascular=MI/PAD/Aortic Plaque, Age if 65-74, or Female] Above score calculated as  2 points each if present [Age > 75, or Stroke/TIA/TE]  # CAD s/p CABG: Not an active issue.  Continue aspirin, atorvastatin, carvedilol.  # Hypertension: Blood pressure is poorly controlled. We are adding diltiazem as above. Therefore we will not make any additional changes to her antihypertensive regimen at this time. Carvedilol and hydralazine as ordered.  Ideally, she would be on an ACE inhibitor or ARB. However she has an acute on chronic renal dysfunction and we're adding other antihypertensives at this time. Continue to monitor her renal function and consider starting an ACE inhibitor or ARB if her blood pressure allows in her renal function normalizes.  Hydralazine can be discontinued in order to do so.  # Chronic systolic and diastolic heart failure: She is currently euvolemic. Continue carvedilol and will attempt to add ACE inhibitor or ARB as above.  Signed: Brianni Manthe C. Oval Linsey, MD, [facility]  07/27/2015, 11:11 AM

## 2015-07-27 NOTE — Plan of Care (Addendum)
In report - 3rd shift RN informed that pt has had 4 episodes of Afib (3 in 170s) - last about 0710 12/3 and about midnight sustaining for 2+ minutes in the 200s.  Contacting Dr. to inform of situation. Dr. Hulen Skains back - no further orders at this time.

## 2015-07-27 NOTE — Progress Notes (Signed)
Physical Therapy Treatment Patient Details Name: Anita Wilson MRN: [id number] DOB: [birth year] Today's Date: 07/27/2015    History of Present Illness Pt with recent R hip fx, ORIF, now returns with hip dislocation and needed a hemiathroplasty.     PT Comments    Pt continues to be very disoriented and confused.  She is not able to stay on task and spends much of the time rambling off topic and needing considerable distraction and redirection to stay on task.  She is able to do some minimal side stepping at EOB, but is too anxious to really fully participate with PT.   Follow Up Recommendations  SNF     Equipment Recommendations       Recommendations for Other Services       Precautions / Restrictions Precautions Precautions: Fall;Posterior Hip Restrictions RLE Weight Bearing: Weight bearing as tolerated    Mobility  Bed Mobility Overal bed mobility: Needs Assistance Bed Mobility: Supine to Sit;Sit to Supine     Supine to sit: Mod assist Sit to supine: Mod assist   General bed mobility comments: Pt initially attempts to assist getting to EOB and then back into bed, but ultimately needs considerable assist to do so as she is distracted and confused and is unable to coordinate doing the actual motion to get to/from sitting  Transfers Overall transfer level: Needs assistance Equipment used: Rolling walker (2 wheeled) Transfers: Sit to/from Stand Sit to Stand: Mod assist         General transfer comment: Pt constantly reporting that she can get to standing but again has a hard time organizing her thoughts enough to do so w/o much cuing and assist.    Ambulation/Gait Ambulation/Gait assistance: Mod assist Ambulation Distance (Feet): 2 Feet Assistive device: Rolling walker (2 wheeled)       General Gait Details: Side stepping along EOB as pt is very anxious and unsure of herself in standing and states that she would not be able to go far or be away from the  bed    Stairs            Wheelchair Mobility    Modified Rankin (Stroke Patients Only)       Balance                                    Cognition Arousal/Alertness: Awake/alert Behavior During Therapy: Restless;Anxious;Agitated Overall Cognitive Status:  (pt still disoriented, frustrated and confused)                      Exercises Total Joint Exercises Ankle Circles/Pumps: AROM;Both;10 reps Quad Sets: Right;10 reps;Strengthening Gluteal Sets: Right;Strengthening;10 reps Short Arc Quad: 10 reps;Right;AAROM Heel Slides: AAROM;10 reps;Right Hip ABduction/ADduction: AAROM;10 reps;Right    General Comments        Pertinent Vitals/Pain Pain Location: again pt in unable to rate, but c/o general pain the entire time that increases no matter how minimal the motion/activity    Home Living                      Prior Function            PT Goals (current goals can now be found in the care plan section) Progress towards PT goals: Progressing toward goals    Frequency  BID    PT Plan Current plan remains appropriate    Co-evaluation  End of Session Equipment Utilized During Treatment: Gait belt Activity Tolerance: Patient limited by pain;Treatment limited secondary to agitation Patient left: with bed alarm set     Time: 1415-1439 PT Time Calculation (min) (ACUTE ONLY): 24 min  Charges:  $Gait Training: 8-22 mins $Therapeutic Exercise: 8-22 mins                    G Codes:     Wayne Both, PT, DPT [phone]  Kreg Shropshire 07/27/2015, 4:17 PM

## 2015-07-27 NOTE — Progress Notes (Signed)
Pt. Had 20 second run of rapid a-fib 170's. Dr. Lavetta Nielsen notified and no new orders at this time. VSS. Pt. Asymptomatic.

## 2015-07-27 NOTE — Progress Notes (Signed)
[facility] [place] NAME: Anita Wilson    MR#:  [id number]  DATE OF BIRTH:  1946-07-02  SUBJECTIVE:  CHIEF COMPLAINT:   Chief Complaint  Patient presents with  . Altered Mental Status    care of his severe pain on the right hip, noted subluxation.  s/p hemiarthroplasty 07/25/15. The patient is alert awake and oriented, no complaint     REVIEW OF SYSTEMS:   Review of Systems  Constitutional: Negative for fever and chills.  Eyes: Negative for blurred vision and double vision.  Respiratory: Negative for cough, sputum production, shortness of breath and wheezing.   Cardiovascular: Negative for chest pain and palpitations.  Gastrointestinal: Negative for nausea, vomiting, abdominal pain, diarrhea and constipation.  Genitourinary: Negative for dysuria, urgency and frequency.  Neurological: Negative for dizziness, tingling, sensory change, speech change, focal weakness and headaches.    DRUG ALLERGIES:   Allergies  Allergen Reactions  . Bee Venom Shortness Of Breath and Swelling  . Diphenhydramine Other (See Comments)    Reaction:  Unknown   . Morphine Other (See Comments)    Reaction:  Hallucinations   . Phenylalanine Diarrhea and Nausea And Vomiting  . Sulfa Antibiotics Other (See Comments)    Reaction:  Unknown     VITALS:  Blood pressure 135/42, pulse 88, temperature 98.2 F (36.8 C), temperature source Oral, resp. rate 18, height 5\' 4"  (1.626 m), weight 55.883 kg (123 lb 3.2 oz), SpO2 100 %.  PHYSICAL EXAMINATION:  Physical Exam  Constitutional: No distress.     Constitutional: She appears well-developed and well-nourished. HENT:  Head: Normocephalic and atraumatic.  Mouth/Throat: Oropharynx is clear and moist.  Eyes: Conjunctivae and EOM are normal. Pupils are equal, round, and reactive to light. No scleral icterus.  Neck: Normal range of motion. Neck supple. No JVD present. No tracheal deviation present.  No thyromegaly present.  Cardiovascular: Normal rate, regular rhythm and normal heart sounds. Exam reveals no gallop and no friction rub.  No murmur heard. Respiratory: Effort normal and breath sounds normal.  GI: Soft. Bowel sounds are normal. She exhibits no distension. There is no tenderness.  Musculoskeletal: Normal range of motion. She exhibits no edema.  S/p surgical dressing right hip. Lymphadenopathy:   She has no cervical adenopathy.  Neurological: She is alert. No cranial nerve deficit. She exhibits normal muscle tone.  Skin: Skin is warm and dry. She is not diaphoretic.  Psychiatric:  Patient is alert, awake and oriented 3. LABORATORY PANEL:   CBC  Recent Labs Lab 07/25/15 0100  WBC 5.3  HGB 10.4*  HCT 31.3*  PLT 243   ------------------------------------------------------------------------------------------------------------------  Chemistries   Recent Labs Lab 07/24/15 1846  07/27/15 0444  NA 137  < > 140  K 4.8  < > 4.6  CL 100*  < > 107  CO2 29  < > 25  GLUCOSE 231*  < > 257*  BUN 38*  < > 40*  CREATININE 1.25*  < > 1.36*  CALCIUM 8.8*  < > 8.1*  AST 20  --   --   ALT 16  --   --   ALKPHOS 111  --   --   BILITOT 0.4  --   --   < > = values in this interval not displayed. ------------------------------------------------------------------------------------------------------------------  Cardiac Enzymes  Recent Labs Lab 07/24/15 1846  TROPONINI <0.03   ------------------------------------------------------------------------------------------------------------------  RADIOLOGY:  Dg Pelvis 1-2 Views  07/25/2015  CLINICAL DATA:  Postop RIGHT hip arthroplasty EXAM: PELVIS - 1-2 VIEW COMPARISON:  None. FINDINGS: RIGHT hip total arthroplasty noted. No evidence fracture dislocation. Joint space narrowing of the LEFT hip. IMPRESSION: No complication following RIGHT hip arthroplasty. Electronically Signed   By: Suzy Bouchard M.D.   On: 07/25/2015  17:47    ASSESSMENT AND PLAN:   Active Problems:   Hip subluxation [facility])  This is a 69 year old Caucasian female admitted for delirium and right hip arthroplasty revision due to subluxation/hardware derangement. 1. Delirium: Possibly secondary to dehydration given acute on chronic kidney injury. Improved. Avoid benzodiazepines.  Dr. Boyce Medici spoke to her daughter on phone- as per her- before the previous admission- pt was living at home alone- and was completely alert and oriented. Since the last admission in hospital- she is confused.  CT head and infection work ups negative.  2. Acute on chronic kidney injury:  Worsening,  Hydrate with intravenous fluid. Avoid nephrotoxic agents. Follow up BMP.  3. Right hip screw dislocation-    S/p hemiarthroplasty 07/25/15. Continue PT, need to skilled nursing facility placement.  4. CAD: Status post CABG.  Continue aspirin, Lipitor, Coreg and Cardizem.  5. Hypertension: controlled.  Continue home medications. Hydralazine when necessary.  6. Diabetes mellitus type 2: Hold oral hypoglycemic agents. Continue Lantus 6 units subcutaneous chest and continue sliding scale.  7. COPD: Continue Spiriva  *  Proximal A. fib with RVR last night. Heart rate is controlled since this morning. Continue Coreg and add Cardizem, will not start anticoagulation due to high risk of fall per cardiology consult.  * Chronic systolic and diastolic heart failure. Stable. Continue Coreg and would add ACE inhibitor if renal function allows per cardiology consult.   All the records are reviewed and case discussed with Care Management/Social Workerr. Management plans discussed with the family and they are in agreement. Greater than 50% time was spent on coordination of care and face-to-face counseling.  CODE STATUS: Full code  TOTAL TIME TAKING CARE OF THIS PATIENT: 38 minutes.   POSSIBLE D/C TO SKILLED NURSING FACILITY IN 2 DAYS, DEPENDING ON CLINICAL  CONDITION.   Demetrios Loll M.D on 07/27/2015   Between 7am to 6pm - Pager - [phone]  After 6pm go to [email or web address] - password [email or web address] [facility]  Office  [phone]  CC: Primary care physician; Alessandra Grout, MD  Note: This dictation was prepared with Dragon dictation along with smaller phrase technology. Any transcriptional errors that result from this process are unintentional.

## 2015-07-27 NOTE — Progress Notes (Signed)
Spoke to Dr Lavetta Nielsen on phone concerning rapid A- fib rate  Of 200 for 2 minutes reported by telemetry clerk.   no order at this time but continue to monitor and report further changes as warranted.

## 2015-07-28 DIAGNOSIS — R072 Precordial pain: Secondary | ICD-10-CM

## 2015-07-28 DIAGNOSIS — I5022 Chronic systolic (congestive) heart failure: Secondary | ICD-10-CM

## 2015-07-28 DIAGNOSIS — I471 Supraventricular tachycardia: Secondary | ICD-10-CM

## 2015-07-28 DIAGNOSIS — R410 Disorientation, unspecified: Secondary | ICD-10-CM

## 2015-07-28 LAB — GLUCOSE, CAPILLARY
Glucose-Capillary: 169 mg/dL — ABNORMAL HIGH (ref 65–99)
Glucose-Capillary: 185 mg/dL — ABNORMAL HIGH (ref 65–99)
Glucose-Capillary: 253 mg/dL — ABNORMAL HIGH (ref 65–99)
Glucose-Capillary: 251 mg/dL — ABNORMAL HIGH (ref 65–99)

## 2015-07-28 LAB — BASIC METABOLIC PANEL
ANION GAP: 5 (ref 5–15)
BUN: 35 mg/dL — ABNORMAL HIGH (ref 6–20)
CALCIUM: 7.9 mg/dL — AB (ref 8.9–10.3)
CO2: 24 mmol/L (ref 22–32)
CREATININE: 1.04 mg/dL — AB (ref 0.44–1.00)
Chloride: 108 mmol/L (ref 101–111)
GFR, EST NON AFRICAN AMERICAN: 54 mL/min — AB (ref >60)
GLUCOSE: 167 mg/dL — AB (ref 65–99)
Potassium: 4.5 mmol/L (ref 3.5–5.1)
Sodium: 137 mmol/L (ref 135–145)

## 2015-07-28 MED ORDER — DILTIAZEM HCL 60 MG PO TABS
60.0000 mg | ORAL_TABLET | Freq: Four times a day (QID) | ORAL | Status: DC
  Administered 2015-07-28 – 2015-07-29 (×5): 60 mg via ORAL
  Filled 2015-07-28 (×5): qty 1

## 2015-07-28 NOTE — Progress Notes (Signed)
Npo for slp evaluation. Refusing meds this am will not wear o2 .states she just wants to go home/ very confused  And uncooperative.chest x -ray shows no new findings.

## 2015-07-28 NOTE — Progress Notes (Signed)
Gave patient prescience, emotional support and prayer  [place] [id number]

## 2015-07-28 NOTE — Progress Notes (Signed)
PATIENT ID: 69 year old woman with hypertension, CAD s/p CABG, paroxysmal atrial fibrillation, diabetes mellitus type 2, and chronic systolic and diastolic heart failure who presented for subluxation of the right hip and is now status post hemiarthroplasty on 07/25/2015.  Now with post-operative tachyarrhythmias.  INTERVAL HISTORY: Started diltiazem yesterday.  Continues to have some runs of what appears to be MAT on telemetry.  Rates up to the 160s.  She is asymptomatic.  She is delirious this AM.  SUBJECTIVE: Complains of intermittent chest pain overnight and this AM.  She also expresses concern about the baby laying under her bed.  She is tearful, saying that no one loves her and that she is fearful someone is chasing after her.    PHYSICAL EXAM Filed Vitals:   07/27/15 1936 07/28/15 0042 07/28/15 0417 07/28/15 0739  BP: 137/52 147/58 175/55 161/52  Pulse: 79 79 79 75  Temp: 97.6 F (36.4 C) 98.9 F (37.2 C) 98.1 F (36.7 C) 98.1 F (36.7 C)  TempSrc: Oral Oral Oral Oral  Resp: 20 18 18 18   Height:      Weight:      SpO2: 97% 96% 97% 97%   General:  Delirious.  Chronically ill-appearing and frail. Neck: No JVD Lungs:  Clear to ausculation bilaterally Heart:  RRR.  No m/r/g.  Nl S1/S2 Abdomen:  Soft, NT, ND.   Extremities:  No edema.  Sitting in bed with hips flexed.  LABS: Lab Results  Component Value Date   TROPONINI <0.03 07/24/2015   Results for orders placed or performed during the hospital encounter of 07/24/15 (from the past 24 hour(s))  Glucose, capillary     Status: Abnormal   Collection Time: 07/27/15 11:56 AM  Result Value Ref Range   Glucose-Capillary 249 (H) 65 - 99 mg/dL   Comment 1 Notify RN   Glucose, capillary     Status: Abnormal   Collection Time: 07/27/15  4:22 PM  Result Value Ref Range   Glucose-Capillary 273 (H) 65 - 99 mg/dL   Comment 1 Notify RN   Glucose, capillary     Status: Abnormal   Collection Time: 07/27/15 10:10 PM  Result Value  Ref Range   Glucose-Capillary 185 (H) 65 - 99 mg/dL   Comment 1 Notify RN   Basic metabolic panel     Status: Abnormal   Collection Time: 07/28/15  4:25 AM  Result Value Ref Range   Sodium 137 135 - 145 mmol/L   Potassium 4.5 3.5 - 5.1 mmol/L   Chloride 108 101 - 111 mmol/L   CO2 24 22 - 32 mmol/L   Glucose, Bld 167 (H) 65 - 99 mg/dL   BUN 35 (H) 6 - 20 mg/dL   Creatinine, Ser 1.04 (H) 0.44 - 1.00 mg/dL   Calcium 7.9 (L) 8.9 - 10.3 mg/dL   GFR calc non Af Amer 54 (L) >60 mL/min   GFR calc Af Amer >60 >60 mL/min   Anion gap 5 5 - 15  Glucose, capillary     Status: Abnormal   Collection Time: 07/28/15  7:41 AM  Result Value Ref Range   Glucose-Capillary 169 (H) 65 - 99 mg/dL   Comment 1 Notify RN     Intake/Output Summary (Last 24 hours) at 07/28/15 1101 Last data filed at 07/27/15 1731  Gross per 24 hour  Intake    240 ml  Output      0 ml  Net    240 ml    Telemetry:  Mostly sinus rhythm in the 80's.  Episodes of likely MAT with rates in the 160s.  ASSESSMENT AND PLAN:  # Paroxysmal atrial fibrillation/MAT: [name] has known atrial fibrillation.  On further review of telemetry, it appears that she is having MAT rather than atrial fibrillation.  This is likely related to her underlying lung disease.  There is no evidence of pneumonia or COPD exacerbation at this time.  Overall her heart rate is better controlled after adding diltiazem.  Will increase to 60 mg of diltiazem and likely consolidate tomorrow.  She has suffered several falls and is a high falls risk in the future. Therefore, we will not start anticoagulation despite her CHA2DS2-Vasc score of 3. She is on aspirin for CAD.  Given that this is not a new diagnosis and she does not have any evidence of heart failure on exam, we will not obtain a repeat echocardiogram at this time.  This patients CHA2DS2-VASc Score and unadjusted Ischemic Stroke Rate (% per year) is equal to 3.2 % stroke rate/year from a score of  3  Above score calculated as 1 point each if present [CHF, HTN, DM, Vascular=MI/PAD/Aortic Plaque, Age if 65-74, or Female] Above score calculated as 2 points each if present [Age > 75, or Stroke/TIA/TE]  # CAD s/p CABG: She complains of chest pain that occurred overnight and this AM, though she is also delirious. - Continue aspirin, atorvastatin, carvedilol. - Check troponin and ECG  # Hypertension: Blood pressure remains poorly controlled.  - Increased Diltiazem as above.  - Renal function has stabilized.  Add lisinopril 20 mg daily and monitor renal function closely - Continue hydralazine and Imdur.  Hydralazine can be stopped if needed, as ACE-I is more improtant at this time  # Chronic systolic and diastolic heart failure: She is currently euvolemic.  - Continue carvedilol  - Add lisinopril as above  Active Problems:   Hip subluxation (HCC)    Anita Wilson C. Oval Linsey, MD, [facility] 07/28/2015 11:01 AM

## 2015-07-28 NOTE — Progress Notes (Signed)
[facility] [place] NAME: Anita Wilson    MR#:  [id number]  DATE OF BIRTH:  [birth year]  SUBJECTIVE:  CHIEF COMPLAINT:   Chief Complaint  Patient presents with  . Altered Mental Status    care of his severe pain on the right hip, noted subluxation.  s/p hemiarthroplasty 07/25/15.   The patient is awake but confused today,  no complaint     REVIEW OF SYSTEMS:   Review of Systems  Constitutional: Negative for fever and chills.  Eyes: Negative for blurred vision and double vision.  Respiratory: Negative for cough, sputum production, shortness of breath and wheezing.   Cardiovascular: Negative for chest pain and palpitations.  Gastrointestinal: Negative for nausea, vomiting, abdominal pain, diarrhea and constipation.  Genitourinary: Negative for dysuria, urgency and frequency.  Neurological: Negative for dizziness, tingling, sensory change, speech change, focal weakness and headaches.    DRUG ALLERGIES:   Allergies  Allergen Reactions  . Bee Venom Shortness Of Breath and Swelling  . Diphenhydramine Other (See Comments)    Reaction:  Unknown   . Morphine Other (See Comments)    Reaction:  Hallucinations   . Phenylalanine Diarrhea and Nausea And Vomiting  . Sulfa Antibiotics Other (See Comments)    Reaction:  Unknown     VITALS:  Blood pressure 162/50, pulse 82, temperature 97.7 F (36.5 C), temperature source Oral, resp. rate 18, height 5\' 4"  (1.626 m), weight 55.883 kg (123 lb 3.2 oz), SpO2 99 %.  PHYSICAL EXAMINATION:  Physical Exam  Constitutional: No distress.     Constitutional: She appears well-developed and well-nourished.  HEENT: Normocephalic and atraumatic.  Mouth/Throat: Oropharynx is clear and moist.  Eyes: Conjunctivae and EOM are normal. Pupils are equal, round, and reactive to light. No scleral icterus.  Neck: Normal range of motion. Neck supple. No JVD present. No tracheal deviation present. No  thyromegaly present.  Cardiovascular: Normal rate, regular rhythm and normal heart sounds. Exam reveals no gallop and no friction rub.  No murmur heard. Respiratory: Effort normal and breath sounds normal.  GI: Soft. Bowel sounds are normal. She exhibits no distension. There is no tenderness.  Musculoskeletal: Normal range of motion. She exhibits no edema.  S/p surgical dressing right hip. Lymphadenopathy:   She has no cervical adenopathy.  Neurological: She is alert. No cranial nerve deficit. She exhibits normal muscle tone.  Skin: Skin is warm and dry. Psychiatric:  Patient is awake but confused.Marland Kitchen LABORATORY PANEL:   CBC  Recent Labs Lab 07/25/15 0100  WBC 5.3  HGB 10.4*  HCT 31.3*  PLT 243   ------------------------------------------------------------------------------------------------------------------  Chemistries   Recent Labs Lab 07/24/15 1846  07/28/15 0425  NA 137  < > 137  K 4.8  < > 4.5  CL 100*  < > 108  CO2 29  < > 24  GLUCOSE 231*  < > 167*  BUN 38*  < > 35*  CREATININE 1.25*  < > 1.04*  CALCIUM 8.8*  < > 7.9*  AST 20  --   --   ALT 16  --   --   ALKPHOS 111  --   --   BILITOT 0.4  --   --   < > = values in this interval not displayed. ------------------------------------------------------------------------------------------------------------------  Cardiac Enzymes  Recent Labs Lab 07/24/15 1846  TROPONINI <0.03   ------------------------------------------------------------------------------------------------------------------  RADIOLOGY:  Dg Chest 1 View  07/27/2015  CLINICAL DATA:  Lung crackles on clinical exam.  EXAM: CHEST 1 VIEW COMPARISON:  07/24/2015 FINDINGS: Postsurgical changes of the thorax are stable. Cardiomediastinal silhouette is stably enlarged. Mediastinal contours appear intact. There is no evidence of focal airspace consolidation, pleural effusion or pneumothorax. Osseous structures are without acute abnormality. Soft  tissues are grossly normal. IMPRESSION: Stably enlarged cardiac silhouette. No evidence of focal airspace consolidation, pulmonary edema or pleural effusion. Electronically Signed   By: Fidela Salisbury M.D.   On: 07/27/2015 19:27    ASSESSMENT AND PLAN:   Active Problems:   Hip subluxation [facility])  This is a 69 year old Caucasian female admitted for delirium and right hip arthroplasty revision due to subluxation/hardware derangement. 1. Delirium: Possibly secondary to dehydration given acute on chronic kidney injury.  Avoid benzodiazepines.  Dr. Boyce Medici spoke to her daughter on phone- as per her- before the previous admission- pt was living at home alone- and was completely alert and oriented. Since the last admission in hospital- she is confused.  CT head and infection work ups negative.  2. Acute on chronic kidney injury:  Improved with rehydration.  Avoid nephrotoxic agents.   3. Right hip screw dislocation-    S/p hemiarthroplasty 07/25/15. Continue PT, need to skilled nursing facility placement.  4. CAD: Status post CABG.  Continue aspirin, Lipitor, Coreg and Cardizem.  5. Hypertension: controlled.  Continue home medications. Hydralazine when necessary.  6. Diabetes mellitus type 2: Hold oral hypoglycemic agents. Continue Lantus 6 units subcutaneous chest and continue sliding scale.  7. COPD: Continue Spiriva  *  Proximal A. fib with RVR last night. Heart rate is controlled since this morning. Continue Coreg,  Added and increased Cardizem to 60 mg po daily per cardiologist , will not start anticoagulation due to high risk of fall per cardiology consult.  * Chronic systolic and diastolic heart failure. Stable. Continue Coreg and add lisinopril due to improved renal function.  All the records are reviewed and case discussed with Care Management/Social Workerr. Management plans discussed with the family and they are in agreement. Greater than 50% time was spent on coordination  of care and face-to-face counseling.  CODE STATUS: Full code  TOTAL TIME TAKING CARE OF THIS PATIENT: 35 minutes.   POSSIBLE D/C TO SKILLED NURSING FACILITY IN 2 DAYS, DEPENDING ON CLINICAL CONDITION.   Demetrios Loll M.D on 07/28/2015   Between 7am to 6pm - Pager - [phone]  After 6pm go to [email or web address] - password [email or web address] [place] [facility]  Office  [phone]  CC: Primary care physician; Alessandra Grout, MD  Note: This dictation was prepared with Dragon dictation along with smaller phrase technology. Any transcriptional errors that result from this process are unintentional.

## 2015-07-28 NOTE — Progress Notes (Signed)
  Subjective: 3 Days Post-Op Procedure(s) (LRB): ARTHROPLASTY BIPOLAR HIP (HEMIARTHROPLASTY) (Right) Patient reports pain as mild.   Patient seen in rounds with Dr. Roland Rack. Patient is doing okay although she did have difficulty swallowing in the night. She is waiting for a swallowing study. Plan is to go Skilled nursing facility after hospital stay. Negative for chest pain and shortness of breath Fever: no Gastrointestinal: Negative for nausea and vomiting  Objective: Vital signs in last 24 hours: Temp:  [97.5 F (36.4 C)-98.9 F (37.2 C)] 98.1 F (36.7 C) (12/04 0417) Pulse Rate:  [78-93] 79 (12/04 0417) Resp:  [18-20] 18 (12/04 0417) BP: (120-175)/(42-104) 175/55 mmHg (12/04 0417) SpO2:  [96 %-100 %] 97 % (12/04 0417)  Intake/Output from previous day:  Intake/Output Summary (Last 24 hours) at 07/28/15 0625 Last data filed at 07/27/15 1731  Gross per 24 hour  Intake    480 ml  Output      0 ml  Net    480 ml    Intake/Output this shift:    Labs: No results for input(s): HGB in the last 72 hours. No results for input(s): WBC, RBC, HCT, PLT in the last 72 hours.  Recent Labs  07/27/15 0444 07/28/15 0425  NA 140 137  K 4.6 4.5  CL 107 108  CO2 25 24  BUN 40* 35*  CREATININE 1.36* 1.04*  GLUCOSE 257* 167*  CALCIUM 8.1* 7.9*   No results for input(s): LABPT, INR in the last 72 hours.   EXAM General - Patient is Alert and Oriented Extremity - Neurovascular intact Dorsiflexion/Plantar flexion intact No cellulitis present Compartment soft Dressing/Incision - clean, dry, no drainage. A new honeycomb dressing was applied yesterday. Motor Function - intact, moving foot and toes well on exam. The patient ambulated several feet in the room.  Past Medical History  Diagnosis Date  . Hypertension   . Diabetes mellitus without complication ([place])   . Allergy   . Depression   . COPD (chronic obstructive pulmonary disease) ([place])   . A-fib ([place])   . CAD (coronary  artery disease)   . Chronic diastolic heart failure ([place])   . Pulmonary HTN ([place])   . CKD (chronic kidney disease), stage III   . Breast cancer (HCC)     Assessment/Plan: 3 Days Post-Op Procedure(s) (LRB): ARTHROPLASTY BIPOLAR HIP (HEMIARTHROPLASTY) (Right) Active Problems:   Hip subluxation (HCC)  Estimated body mass index is 21.14 kg/(m^2) as calculated from the following:   Height as of this encounter: 5\' 4"  (1.626 m).   Weight as of this encounter: 55.883 kg (123 lb 3.2 oz). Up with therapy  Discharge to skilled nursing when medically cleared.  DVT Prophylaxis - Lovenox, Foot Pumps and TED hose Weight-Bearing as tolerated to Right leg  Reche Dixon, PA-C Orthopaedic Surgery 07/28/2015, 6:25 AM

## 2015-07-28 NOTE — Progress Notes (Signed)
Physical Therapy Treatment Patient Details Name: Anita Wilson MRN: [id number] DOB: 05/21/1946 Today's Date: 07/28/2015    History of Present Illness Pt with recent R hip fx, ORIF, now returns with hip dislocation and needed a hemiathroplasty.     PT Comments    Pt is able to actually take some steps today that could be called ambulation.  She still is very confused and needs a lot of cuing and explaining, but ultimately PT was able to convince her to participate well with most acts despite her lack of awareness.    Follow Up Recommendations  SNF     Equipment Recommendations  None recommended by PT    Recommendations for Other Services Rehab consult     Precautions / Restrictions Precautions Precautions: Fall;Posterior Hip Restrictions Weight Bearing Restrictions: Yes RLE Weight Bearing: Weight bearing as tolerated    Mobility  Bed Mobility Overal bed mobility: Needs Assistance Bed Mobility: Supine to Sit     Supine to sit: Mod assist     General bed mobility comments: Pt shows little ability to try getting to EOB secondary to confusion  Transfers Overall transfer level: Needs assistance Equipment used: Rolling walker (2 wheeled) Transfers: Sit to/from Stand Sit to Stand: Mod assist         General transfer comment: Pt does not lean back as aggressively this standing attempt and with assist and cuing is able to maintain standing balance with heavy UE use  Ambulation/Gait Ambulation/Gait assistance: Mod assist Ambulation Distance (Feet): 7 Feet Assistive device: Rolling walker (2 wheeled)       General Gait Details: Pt is actually able to take a few legitimate steps forward and backwards with only min/mod assist and though she is impulsive and needs a lof of cuing she is able to maintain upright balance better than on any previous attempt.   Stairs            Wheelchair Mobility    Modified Rankin (Stroke Patients Only)       Balance                                     Cognition   Behavior During Therapy: Restless;Anxious;Agitated Overall Cognitive Status: Difficult to assess                      Exercises Total Joint Exercises Ankle Circles/Pumps: AROM;Both;10 reps Quad Sets: Right;10 reps;Strengthening Gluteal Sets: Right;Strengthening;10 reps Short Arc Quad: 10 reps;Right;AAROM Heel Slides: AAROM;10 reps;Right Hip ABduction/ADduction: AAROM;10 reps;Right Straight Leg Raises: AAROM;10 reps    General Comments        Pertinent Vitals/Pain Pain Assessment:  (unable to rate, consistently indicates general pain )    Home Living                      Prior Function            PT Goals (current goals can now be found in the care plan section)      Frequency  BID    PT Plan Current plan remains appropriate    Co-evaluation             End of Session Equipment Utilized During Treatment: Gait belt Activity Tolerance: Patient limited by pain;Treatment limited secondary to agitation Patient left: with chair alarm set     Time: [id number] PT Time Calculation (min) (ACUTE ONLY): 28  min  Charges:  $Gait Training: 8-22 mins $Therapeutic Exercise: 8-22 mins                    G Codes:     Wayne Both, [place], DPT [phone]  Kreg Shropshire 07/28/2015, 1:37 PM

## 2015-07-28 NOTE — Consult Note (Signed)
CC: confusion   HPI: Anita Wilson is an 69 y.o. female Caucasian female admitted for delirium and right hip arthroplasty revision due to subluxation/hardware derangement. Pt is very tearful, afraid that someone trying to keep family away from her.    Past Medical History  Diagnosis Date  . Hypertension   . Diabetes mellitus without complication ([place])   . Allergy   . Depression   . COPD (chronic obstructive pulmonary disease) ([place])   . A-fib ([place])   . CAD (coronary artery disease)   . Chronic diastolic heart failure ([place])   . Pulmonary HTN ([place])   . CKD (chronic kidney disease), stage III   . Breast cancer [facility])     Past Surgical History  Procedure Laterality Date  . Coronary artery bypass graft    . Mastectomy    . Parathyroidectomy    . Appendectomy    . Breast lumpectomy with axillary lymph node dissection    . Hip pinning,cannulated Right 07/10/2015    Procedure: CANNULATED HIP PINNING;  Surgeon: Corky Mull, MD;  Location: [facility] ORS;  Service: Orthopedics;  Laterality: Right;  right  . Hip arthroplasty Right 07/25/2015    Procedure: ARTHROPLASTY BIPOLAR HIP (HEMIARTHROPLASTY);  Surgeon: Corky Mull, MD;  Location: [facility] ORS;  Service: Orthopedics;  Laterality: Right;    Family History  Problem Relation Age of Onset  . Hypertension Mother   . Diabetes Mother   . Heart disease Father   . Diabetes Father   . Diabetes Sister   . Breast cancer Sister   . Dementia Sister     Social History:  reports that she has been smoking.  She has never used smokeless tobacco. She reports that she drinks alcohol. She reports that she does not use illicit drugs.  Allergies  Allergen Reactions  . Bee Venom Shortness Of Breath and Swelling  . Diphenhydramine Other (See Comments)    Reaction:  Unknown   . Morphine Other (See Comments)    Reaction:  Hallucinations   . Phenylalanine Diarrhea and Nausea And Vomiting  . Sulfa Antibiotics Other (See Comments)    Reaction:   Unknown     Medications: I have reviewed the patient's current medications.  ROS: Unable to obtain due to confusion   Physical Examination: Blood pressure 162/50, pulse 82, temperature 97.7 F (36.5 C), temperature source Oral, resp. rate 18, height 5\' 4"  (1.626 m), weight 123 lb 3.2 oz (55.883 kg), SpO2 99 %.   Neurological Examination Mental Status: Alert, knows her name and states she is in [place], but could not tell me where she is.   Cranial Nerves: II: Discs flat bilaterally; Visual fields grossly normal, pupils equal, round, reactive to light and accommodation III,IV, VI: ptosis not present, extra-ocular motions intact bilaterally V,VII: smile symmetric, facial light touch sensation normal bilaterally VIII: hearing normal bilaterally IX,X: gag reflex present XI: bilateral shoulder shrug XII: midline tongue extension Motor: Right : Upper extremity   4/5    Left:     Upper extremity   4/5  Lower extremity   4/5     Lower extremity   4/5 Tone and bulk:normal tone throughout; no atrophy noted Sensory: Pinprick and light touch intact throughout, bilaterally Deep Tendon Reflexes: 1+ Plantars: Right: downgoing   Left: downgoing Cerebellar: Not tested  Gait: not tested.       Laboratory Studies:   Basic Metabolic Panel:  Recent Labs Lab 07/24/15 1846 07/25/15 0100 07/26/15 0416 07/27/15 0444 07/28/15 0425  NA 137 138 137 140 137  K 4.8 4.6 5.3* 4.6 4.5  CL 100* 102 107 107 108  CO2 29 29 22 25 24   GLUCOSE 231* 181* 216* 257* 167*  BUN 38* 33* 33* 40* 35*  CREATININE 1.25* 1.01* 1.25* 1.36* 1.04*  CALCIUM 8.8* 8.4* 7.5* 8.1* 7.9*    Liver Function Tests:  Recent Labs Lab 07/24/15 1846  AST 20  ALT 16  ALKPHOS 111  BILITOT 0.4  PROT 7.3  ALBUMIN 3.0*   No results for input(s): LIPASE, AMYLASE in the last 168 hours. No results for input(s): AMMONIA in the last 168 hours.  CBC:  Recent Labs Lab 07/24/15 1846 07/25/15 0100  WBC 6.8 5.3   NEUTROABS 5.0  --   HGB 11.8* 10.4*  HCT 34.8* 31.3*  MCV 94.6 94.6  PLT 276 243    Cardiac Enzymes:  Recent Labs Lab 07/24/15 1846  TROPONINI <0.03    BNP: Invalid input(s): POCBNP  CBG:  Recent Labs Lab 07/27/15 1156 07/27/15 1622 07/27/15 2210 07/28/15 0741 07/28/15 1113  GLUCAP 249* 273* 185* 169* 185*    Microbiology: Results for orders placed or performed during the hospital encounter of 07/24/15  MRSA PCR Screening     Status: None   Collection Time: 07/25/15  1:12 AM  Result Value Ref Range Status   MRSA by PCR NEGATIVE NEGATIVE Final    Comment:        The GeneXpert MRSA Assay (FDA approved for NASAL specimens only), is one component of a comprehensive MRSA colonization surveillance program. It is not intended to diagnose MRSA infection nor to guide or monitor treatment for MRSA infections.     Coagulation Studies: No results for input(s): LABPROT, INR in the last 72 hours.  Urinalysis:  Recent Labs Lab 07/24/15 2034  COLORURINE YELLOW*  LABSPEC 1.011  PHURINE 6.0  GLUCOSEU NEGATIVE  HGBUR NEGATIVE  BILIRUBINUR NEGATIVE  KETONESUR NEGATIVE  PROTEINUR >500*  NITRITE NEGATIVE  LEUKOCYTESUR NEGATIVE    Lipid Panel:  No results found for: CHOL, TRIG, HDL, CHOLHDL, VLDL, LDLCALC  HgbA1C:  Lab Results  Component Value Date   HGBA1C 6.6* 07/24/2015    Urine Drug Screen:  No results found for: LABOPIA, COCAINSCRNUR, LABBENZ, AMPHETMU, THCU, LABBARB  Alcohol Level: No results for input(s): ETH in the last 168 hours.    Imaging: Dg Chest 1 View  07/27/2015  CLINICAL DATA:  Lung crackles on clinical exam. EXAM: CHEST 1 VIEW COMPARISON:  07/24/2015 FINDINGS: Postsurgical changes of the thorax are stable. Cardiomediastinal silhouette is stably enlarged. Mediastinal contours appear intact. There is no evidence of focal airspace consolidation, pleural effusion or pneumothorax. Osseous structures are without acute abnormality. Soft  tissues are grossly normal. IMPRESSION: Stably enlarged cardiac silhouette. No evidence of focal airspace consolidation, pulmonary edema or pleural effusion. Electronically Signed   By: Fidela Salisbury M.D.   On: 07/27/2015 19:27     Assessment/Plan:  69 y.o. female Caucasian female admitted for delirium and right hip arthroplasty revision due to subluxation/hardware derangement. Pt is very tearful, afraid that someone trying to keep family away from her.    I do agree this is likely delirium as pt is disoriented/confused  If needed low dose atypical antipsychotics such as seroquel or risperidal. No benzodiazepines.   I don't think pt needs any further imaging from neuro stand point.  Appreciate cardiology evaluation for A-fib.   Leotis Pain  07/28/2015, 1:40 PM

## 2015-07-29 LAB — TROPONIN T

## 2015-07-29 LAB — SURGICAL PATHOLOGY

## 2015-07-29 LAB — GLUCOSE, CAPILLARY: Glucose-Capillary: 239 mg/dL — ABNORMAL HIGH (ref 65–99)

## 2015-07-29 MED ORDER — DILTIAZEM HCL 60 MG PO TABS: 60.0000 mg | ORAL_TABLET | Freq: Four times a day (QID) | ORAL | Status: DC

## 2015-07-29 NOTE — Progress Notes (Signed)
Pt. Refused to have vital signs taken this morning.

## 2015-07-29 NOTE — Discharge Instructions (Signed)
POSTERIOR TOTAL HIP REPLACEMENT POSTOPERATIVE DIRECTIONS ° °Hip Rehabilitation, Guidelines Following Surgery  °The results of a hip operation are greatly improved after range of motion and muscle strengthening exercises. Follow all safety measures which are given to protect your hip. If any of these exercises cause increased pain or swelling in your joint, decrease the amount until you are comfortable again. Then slowly increase the exercises. Call your caregiver if you have problems or questions.  ° °HOME CARE INSTRUCTIONS  °Remove items at home which could result in a fall. This includes throw rugs or furniture in walking pathways.  °· ICE to the affected hip every three hours for 30 minutes at a time and then as needed for pain and swelling.  Continue to use ice on the hip for pain and swelling from surgery. You may notice swelling that will progress down to the foot and ankle.  This is normal after surgery.  Elevate the leg when you are not up walking on it.   °· Continue to use the breathing machine which will help keep your temperature down.  It is common for your temperature to cycle up and down following surgery, especially at night when you are not up moving around and exerting yourself.  The breathing machine keeps your lungs expanded and your temperature down. ° °DIET °You may resume your previous home diet once your are discharged from the hospital. ° °DRESSING / WOUND CARE / SHOWERING °Keep the surgical dressing until follow up.  The dressing is water proof, so you can shower without any extra covering.  IF THE DRESSING FALLS OFF or the wound gets wet inside, change the dressing with sterile gauze.  Please use good hand washing techniques before changing the dressing.  Do not use any lotions or creams on the incision until instructed by your surgeon.   °You need to keep your dressing dry after discharge.   °Change the surgical dressing if needed with Physical Therapy and reapply a dry dressing each  time. ° ° ° °ACTIVITY °Walk with your walker as instructed. °Use walker as long as suggested by your caregivers. °Avoid periods of inactivity such as sitting longer than an hour when not asleep. This helps prevent blood clots.  °You may resume a sexual relationship in one month or when given the OK by your doctor.  °You may return to work once you are cleared by your doctor.  °Do not drive a car for 6 weeks or until released by you surgeon.  °Do not drive while taking narcotics. ° °WEIGHT BEARING °Weight bearing as tolerated with assist device (walker, cane, etc) as directed, use it as long as suggested by your surgeon or therapist, typically at least 4-6 weeks. ° °POSTOPERATIVE CONSTIPATION PROTOCOL °Constipation - defined medically as fewer than three stools per week and severe constipation as less than one stool per week. ° °One of the most common issues patients have following surgery is constipation.  Even if you have a regular bowel pattern at home, your normal regimen is likely to be disrupted due to multiple reasons following surgery.  Combination of anesthesia, postoperative narcotics, change in appetite and fluid intake all can affect your bowels.  In order to avoid complications following surgery, here are some recommendations in order to help you during your recovery period. ° °Colace (docusate) - Pick up an over-the-counter form of Colace or another stool softener and take twice a day as long as you are requiring postoperative pain medications.  Take with a   full glass of water daily.  If you experience loose stools or diarrhea, hold the colace until you stool forms back up.  If your symptoms do not get better within 1 week or if they get worse, check with your doctor. ° °Dulcolax (bisacodyl) - Pick up over-the-counter and take as directed by the product packaging as needed to assist with the movement of your bowels.  Take with a full glass of water.  Use this product as needed if not relieved by Colace  only.  ° °MiraLax (polyethylene glycol) - Pick up over-the-counter to have on hand.  MiraLax is a solution that will increase the amount of water in your bowels to assist with bowel movements.  Take as directed and can mix with a glass of water, juice, soda, coffee, or tea.  Take if you go more than two days without a movement. °Do not use MiraLax more than once per day. Call your doctor if you are still constipated or irregular after using this medication for 7 days in a row. ° °If you continue to have problems with postoperative constipation, please contact the office for further assistance and recommendations.  If you experience "the worst abdominal pain ever" or develop nausea or vomiting, please contact the office immediatly for further recommendations for treatment. ° °ITCHING ° If you experience itching with your medications, try taking only a single pain pill, or even half a pain pill at a time.  You can also use Benadryl over the counter for itching or also to help with sleep.  ° °TED HOSE STOCKINGS °Wear the elastic stockings on both legs for three weeks following surgery during the day but you may remove then at night for sleeping. ° °MEDICATIONS °See your medication summary on the “After Visit Summary” that the nursing staff will review with you prior to discharge.  You may have some home medications which will be placed on hold until you complete the course of blood thinner medication.  It is important for you to complete the blood thinner medication as prescribed by your surgeon.  Continue your approved medications as instructed at time of discharge. ° °PRECAUTIONS °If you experience chest pain or shortness of breath - call 911 immediately for transfer to the hospital emergency department.  °If you develop a fever greater that 101 F, purulent drainage from wound, increased redness or drainage from wound, foul odor from the wound/dressing, or calf pain - CONTACT YOUR SURGEON.   °                                                 °FOLLOW-UP APPOINTMENTS °Make sure you keep all of your appointments after your operation with your surgeon and caregivers. You should call the office at the above phone number and make an appointment for approximately two weeks after the date of your surgery or on the date instructed by your surgeon outlined in the "After Visit Summary". ° °RANGE OF MOTION AND STRENGTHENING EXERCISES  °These exercises are designed to help you keep full movement of your hip joint. Follow your caregiver's or physical therapist's instructions. Perform all exercises about fifteen times, three times per day or as directed. Exercise both hips, even if you have had only one joint replacement. These exercises can be done on a training (exercise) mat, on the floor, on a table or on a   bed. Use whatever works the best and is most comfortable for you. Use music or television while you are exercising so that the exercises are a pleasant break in your day. This will make your life better with the exercises acting as a break in routine you can look forward to.  Lying on your back, slowly slide your foot toward your buttocks, raising your knee up off the floor. Then slowly slide your foot back down until your leg is straight again.  Lying on your back spread your legs as far apart as you can without causing discomfort.  Lying on your side, raise your upper leg and foot straight up from the floor as far as is comfortable. Slowly lower the leg and repeat.  Lying on your back, tighten up the muscle in the front of your thigh (quadriceps muscles). You can do this by keeping your leg straight and trying to raise your heel off the floor. This helps strengthen the largest muscle supporting your knee.  Lying on your back, tighten up the muscles of your buttocks both with the legs straight and with the knee bent at a comfortable angle while keeping your heel on the floor.      IF YOU ARE TRANSFERRED TO A SKILLED REHAB  FACILITY If the patient is transferred to a skilled rehab facility following release from the hospital, a list of the current medications will be sent to the facility for the patient to continue.  When discharged from the skilled rehab facility, please have the facility set up the patient's [place] prior to being released. Also, the skilled facility will be responsible for providing the patient with their medications at time of release from the facility to include their pain medication, the muscle relaxants, and their blood thinner medication. If the patient is still at the rehab facility at time of the two week follow up appointment, the skilled rehab facility will also need to assist the patient in arranging follow up appointment in our office and any transportation needs.  MAKE SURE YOU:  Understand these instructions.  Get help right away if you are not doing well or get worse.    Pick up stool softner and laxative for home use following surgery while on pain medications. Do not submerge incision under water. Please use good hand washing techniques while changing dressing each day. May shower starting three days after surgery. Please use a clean towel to pat the incision dry following showers. Continue to use ice for pain and swelling after surgery. Do not use any lotions or creams on the incision until instructed by your surgeon.  Heart healthy, ADA, mechanical soft diet with thin liquid. Activity as tolerated.

## 2015-07-29 NOTE — NC FL2 (Signed)
[place] LEVEL OF CARE SCREENING TOOL     IDENTIFICATION  Patient Name: Anita Wilson [id number] Birthdate: 03/26/1946 Sex: female Admission Date (Current Location): 07/24/2015  [place] and [place] Number:  [facility] )   Facility and Address:  [facility], [address] [place], [place] [address]      Provider Number: [id number]  Attending Physician Name and Address:  Demetrios Loll, MD  Relative Name and Phone Number:       Current Level of Care: Hospital Recommended Level of Care: [place] Prior Approval Number:    Date Approved/Denied:   PASRR Number:  ( [id number] )  Discharge Plan: SNF    Current Diagnoses: Patient Active Problem List   Diagnosis Date Noted  . 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 Fracture of femoral neck, right ([place]) 07/10/2015  . Type 2 diabetes mellitus ([place]) 07/10/2015  . GERD (gastroesophageal reflux disease) 07/10/2015  . COPD (chronic obstructive pulmonary disease) ([place]) 07/10/2015  . CAD (coronary artery disease) 07/10/2015  . HTN (hypertension) 07/10/2015  . Chronic diastolic CHF (congestive heart failure) ([place]) 07/10/2015  . CKD (chronic kidney disease), stage III 07/10/2015  . A-fib ([place]) 07/10/2015    Orientation ACTIVITIES/SOCIAL BLADDER RESPIRATION    Self  Passive Incontinent, Indwelling catheter Normal  BEHAVIORAL SYMPTOMS/MOOD NEUROLOGICAL BOWEL NUTRITION STATUS   (none )  (none ) Incontinent Diet (NPO for surgery )  PHYSICIAN VISITS COMMUNICATION OF NEEDS Height & Weight Skin  30 days Verbally 5\' 4"  (162.6 cm) 106 lbs. Surgical wounds (Incision: Right Hip. )          AMBULATORY STATUS RESPIRATION    Assist extensive Normal      Personal Care Assistance Level of Assistance  Bathing, Feeding, Dressing Bathing Assistance: Limited assistance Feeding assistance: Independent Dressing Assistance: Limited assistance      Functional Limitations Info  Sight,  Hearing, Speech Sight Info: Impaired Hearing Info: Adequate Speech Info: Adequate       SPECIAL CARE FACTORS FREQUENCY  PT (By licensed PT), OT (By licensed OT)     PT Frequency:  (5) OT Frequency:  (5)           Additional Factors Info  Code Status, Allergies, Insulin Sliding Scale Code Status Info:  (Full Code. ) Allergies Info:  (Bee Venom, Diphenhydramine, Morphine, Phenylalanine, Sulfa Antibiotics)   Insulin Sliding Scale Info:  (Novolog Insulin Injections 3 times daily )       Current Medications (07/29/2015):  This is the current hospital active medication list Current Facility-Administered Medications  Medication Dose Route Frequency Provider Last Rate Last Dose  . acetaminophen (TYLENOL) tablet 650 mg  650 mg Oral Q6H PRN Corky Mull, MD       Or  . acetaminophen (TYLENOL) suppository 650 mg  650 mg Rectal Q6H PRN Corky Mull, MD      . aspirin EC tablet 81 mg  81 mg Oral Daily Harrie Foreman, MD   81 mg at 07/29/15 0818  . atorvastatin (LIPITOR) tablet 40 mg  40 mg Oral Daily Harrie Foreman, MD   40 mg at 07/29/15 0818  . bisacodyl (DULCOLAX) suppository 10 mg  10 mg Rectal Daily PRN Corky Mull, MD      . carvedilol (COREG) tablet 25 mg  25 mg Oral BID Harrie Foreman, MD   25 mg at 07/29/15 [id number]  . cholecalciferol (VITAMIN D) tablet 1,000 Units  1,000 Units Oral Daily Harrie Foreman, MD  1,000 Units at 07/29/15 0819  . citalopram (CELEXA) tablet 10 mg  10 mg Oral Daily Harrie Foreman, MD   10 mg at 07/29/15 0818  . diltiazem (CARDIZEM) tablet 60 mg  60 mg Oral 4 times per day Skeet Latch, MD   60 mg at 07/29/15 0500  . diphenhydrAMINE (BENADRYL) 12.5 MG/5ML elixir 12.5-25 mg  12.5-25 mg Oral Q4H PRN Corky Mull, MD      . docusate sodium (COLACE) capsule 100 mg  100 mg Oral BID Corky Mull, MD   100 mg at 07/29/15 0818  . enoxaparin (LOVENOX) injection 40 mg  40 mg Subcutaneous Q24H Corky Mull, MD   40 mg at 07/29/15 0817  . ferrous  sulfate tablet 325 mg  325 mg Oral TID PC Harrie Foreman, MD   325 mg at 07/29/15 [id number]  . fluticasone (FLONASE) 50 MCG/ACT nasal spray 2 spray  2 spray Each Nare Daily PRN Harrie Foreman, MD      . hydrALAZINE (APRESOLINE) injection 10 mg  10 mg Intravenous Q4H PRN Lytle Butte, MD   10 mg at 07/25/15 0118  . hydrALAZINE (APRESOLINE) tablet 25 mg  25 mg Oral BID Harrie Foreman, MD   25 mg at 07/29/15 [id number]  . HYDROmorphone (DILAUDID) injection 0.5-1 mg  0.5-1 mg Intravenous Q2H PRN Corky Mull, MD   0.5 mg at 07/27/15 0503  . Influenza vac split quadrivalent PF (FLUARIX) injection 0.5 mL  0.5 mL Intramuscular Tomorrow-1000 Hessie Knows, MD      . insulin aspart (novoLOG) injection 0-9 Units  0-9 Units Subcutaneous TID WC Harrie Foreman, MD   5 Units at 07/29/15 [phone]  . insulin glargine (LANTUS) injection 6 Units  6 Units Subcutaneous QHS Harrie Foreman, MD   6 Units at 07/28/15 2236  . isosorbide mononitrate (IMDUR) 24 hr tablet 30 mg  30 mg Oral Daily Harrie Foreman, MD   30 mg at 07/29/15 [id number]  . letrozole [facility]) tablet 2.5 mg  2.5 mg Oral Daily Harrie Foreman, MD   2.5 mg at 07/29/15 0820  . magnesium hydroxide (MILK OF MAGNESIA) suspension 30 mL  30 mL Oral Daily PRN Corky Mull, MD      . metaxalone [facility]) tablet 400 mg  400 mg Oral Q4H PRN Harrie Foreman, MD      . metoCLOPramide (REGLAN) tablet 5-10 mg  5-10 mg Oral Q8H PRN Corky Mull, MD       Or  . metoCLOPramide (REGLAN) injection 5-10 mg  5-10 mg Intravenous Q8H PRN Corky Mull, MD      . multivitamin with minerals tablet 1 tablet  1 tablet Oral Daily Harrie Foreman, MD   1 tablet at 07/29/15 [phone]  . ondansetron (ZOFRAN) tablet 4 mg  4 mg Oral Q6H PRN Corky Mull, MD       Or  . ondansetron [facility]) injection 4 mg  4 mg Intravenous Q6H PRN Corky Mull, MD      . oxyCODONE (Oxy IR/ROXICODONE) immediate release tablet 5-10 mg  5-10 mg Oral Q3H PRN Corky Mull, MD   5 mg at 07/29/15 0500  .  pantoprazole (PROTONIX) EC tablet 40 mg  40 mg Oral Daily Harrie Foreman, MD   40 mg at 07/29/15 [id number]  . pneumococcal 23 valent vaccine (PNU-IMMUNE) injection 0.5 mL  0.5 mL Intramuscular Tomorrow-1000 Hessie Knows, MD      .  sodium chloride 0.9 % injection 3 mL  3 mL Intravenous Q12H Harrie Foreman, MD   3 mL at 07/28/15 0100  . sodium phosphate (FLEET) 7-19 GM/118ML enema 1 enema  1 enema Rectal Once PRN Corky Mull, MD      . tiotropium [facility]) inhalation capsule 18 mcg  18 mcg Inhalation Daily Harrie Foreman, MD   18 mcg at 07/29/15 [id number]     Discharge Medications: Please see discharge summary for a list of discharge medications.  Relevant Imaging Results:  Relevant Lab Results:  Recent Labs    Additional Information  (SSN: [id number])  Loralyn Freshwater, LCSW

## 2015-07-29 NOTE — Care Management Important Message (Signed)
Important Message  Patient Details  Name: Anita Wilson MRN: [id number] Date of Birth: [birth year]   Medicare Important Message Given:  Yes    [name] A Catrice Zuleta 07/29/2015, 10:23 AM

## 2015-07-29 NOTE — Evaluation (Signed)
Clinical/Bedside Swallow Evaluation Patient Details  Name: Anita Wilson MRN: [id number] Date of Birth: Jan 14, 1946  Today's Date: 07/29/2015 Time: SLP Start Time (ACUTE ONLY): 0900 SLP Stop Time (ACUTE ONLY): 1000 SLP Time Calculation (min) (ACUTE ONLY): 60 min  Past Medical History:  Past Medical History  Diagnosis Date  . Hypertension   . Diabetes mellitus without complication ([place])   . Allergy   . Depression   . COPD (chronic obstructive pulmonary disease) ([place])   . A-fib ([place])   . CAD (coronary artery disease)   . Chronic diastolic heart failure ([place])   . Pulmonary HTN ([place])   . CKD (chronic kidney disease), stage III   . Breast cancer [facility])    Past Surgical History:  Past Surgical History  Procedure Laterality Date  . Coronary artery bypass graft    . Mastectomy    . Parathyroidectomy    . Appendectomy    . Breast lumpectomy with axillary lymph node dissection    . Hip pinning,cannulated Right 07/10/2015    Procedure: CANNULATED HIP PINNING;  Surgeon: Corky Mull, MD;  Location: [facility] ORS;  Service: Orthopedics;  Laterality: Right;  right  . Hip arthroplasty Right 07/25/2015    Procedure: ARTHROPLASTY BIPOLAR HIP (HEMIARTHROPLASTY);  Surgeon: Corky Mull, MD;  Location: [facility] ORS;  Service: Orthopedics;  Laterality: Right;   HPI:  Pt has multiple medical issues. She presented to the ED from her nursing home where she had been recuperating status post right hip arthroplasty post falls. Nursing home staff to become concerned due to the patient's mental status change and cries of pain. In the emergency department, the patient could not provide her own history but complained of pain up and down her right side. Occasionally she would point to her chest and continually say "somebody get it down, I just can't get it down". Troponin was negative. EKG shows no acute changes. CT of the head was negative for acute process. Orthopedic surgery was called to consult on the recent  pinning procedure to her right hip and determined that it needed revision. Pt is currently awake and conversive w/ SLP; she answered basic questions. NSG reported she has beeb tearful w/ concern for delirium; Neurologist consulted and agreed indicating delirium and disorientation. Pt was able to converse and follow directions during meal time w/ SLP. NSG has been giving meds in puree - crushed at times. In the H/P, it was noted pt pointed to her chest indicating discomfort - unsure if any GI dysmotility; reflux.    Assessment / Plan / Recommendation Clinical Impression  Pt appeared to safely tolerate trials of thin liquids and soft solid foods w/ no overt s/s of aspiration noted; no significant oral phase deficits noted. Pt fed self several bites of foods and sips of liquids; distracted at times but was easily redirected to task of eating her meal. NSG attending to her back pain. Suggested pt attempting to take po pills one at a time w/ sip of liquid as she was accostomed to doing at home but use puree as a backup. Discussed general aspiration precautions w/ pt; pt repeated 2-3 precautions. Pt appears at reduced risk for aspiration following genreal aspiration precautions.     Aspiration Risk   (reduced)    Diet Recommendation  Mech Soft/regular diet w/ meats cut well and moistened w/ gravy; general aspiration precautions; setup assist at meals.   Medication Administration: Whole meds with liquid (or w/ puree)    Other  Recommendations Recommended  Consults: Consider GI evaluation (if any s/s occur;  Dietician consult ) Oral Care Recommendations: Oral care BID;Patient independent with oral care (w/ setup)   Follow up Recommendations  Skilled Nursing facility (for PT/OT)    Frequency and Duration            Prognosis Prognosis for Safe Diet Advancement: Good Barriers to Reach Goals: Behavior      Swallow Study   General Date of Onset: 07/24/15 HPI: Pt has multiple medical issues. She  presented to the ED from her nursing home where she had been recuperating status post right hip arthroplasty post falls. Nursing home staff to become concerned due to the patient's mental status change and cries of pain. In the emergency department, the patient could not provide her own history but complained of pain up and down her right side. Occasionally she would point to her chest and continually say "somebody get it down, I just can't get it down". Troponin was negative. EKG shows no acute changes. CT of the head was negative for acute process. Orthopedic surgery was called to consult on the recent pinning procedure to her right hip and determined that it needed revision. Pt is currently awake and conversive w/ SLP; she answered basic questions. NSG reported she has beeb tearful w/ concern for delirium; Neurologist consulted and agreed indicating delirium and disorientation. Pt was able to converse and follow directions during meal time w/ SLP. NSG has been giving meds in puree - crushed at times. In the H/P, it was noted pt pointed to her chest indicating discomfort - unsure if any GI dysmotility; reflux.  Type of Study: Bedside Swallow Evaluation Previous Swallow Assessment: none indicated Diet Prior to this Study: Regular;Thin liquids Temperature Spikes Noted: No (wbc 5.3 preivously) Respiratory Status: Room air History of Recent Intubation: No Behavior/Cognition: Alert;Cooperative;Pleasant mood;Confused;Distractible;Requires cueing Oral Cavity Assessment: Within Functional Limits Oral Care Completed by SLP: Recent completion by staff Oral Cavity - Dentition:  (dentures) Vision: Functional for self-feeding Self-Feeding Abilities: Able to feed self;Needs set up;Needs assist Patient Positioning: Upright in bed Baseline Vocal Quality: Normal Volitional Cough: Strong Volitional Swallow: Able to elicit    Oral/Motor/Sensory Function Overall Oral Motor/Sensory Function: Within functional limits    Ice Chips Ice chips: Within functional limits Presentation: Spoon (fed; 3 trials)   Thin Liquid Thin Liquid: Within functional limits Presentation: Cup;Self Fed;Straw (~4-5 ozs) Other Comments: coffee and juice    Nectar Thick Nectar Thick Liquid: Not tested   Honey Thick Honey Thick Liquid: Not tested   Puree Puree: Within functional limits Presentation: Self Fed;Spoon (5 trials)   Solid Solid: Within functional limits Presentation: Self Fed;Spoon (10+ trials) Other Comments: french toast; eggs; potato pieces      Orinda Kenner, MS, CCC-SLP  Syrina Wake 07/29/2015,12:53 PM

## 2015-07-29 NOTE — Progress Notes (Signed)
Physical Therapy Treatment Patient Details Name: Anita Wilson MRN: [id number] DOB: 15-Apr-1946 Today's Date: 07/29/2015    History of Present Illness Pt with recent R hip fx, ORIF, now returns with falls and hip dislocation, undergoing a hemiathroplasty to correct.     PT Comments    Pt tolerating treatment session well, motivated but unable to complete entire PT sesssion as planned due to somnolence. Pt progress toward goals remains limited as evidenced by inability to progress bed exercises or functional mobility. Pt's greatest limitation continues to be profound weakness in hip abduction, extension, and knee extension, which continues to limit ability to perform all mobility at baseline function. Patient presenting with impairment of strength, pain, range of motion, and activity tolerance, limiting ability to perform ADL and mobility tasks at  baseline level of function. Patient will benefit from skilled intervention to address the above impairments and limitations, in order to restore to prior level of function, improve patient safety upon discharge, and to decrease caregiver burden.    Follow Up Recommendations  SNF     Equipment Recommendations  None recommended by PT    Recommendations for Other Services Rehab consult     Precautions / Restrictions Precautions Precautions: Fall;Posterior Hip Restrictions Weight Bearing Restrictions: Yes RLE Weight Bearing: Weight bearing as tolerated    Mobility  Bed Mobility               General bed mobility comments: Not performed this session; pt falling asleep mid exercise 5 times prior to completion of session.   Transfers                 General transfer comment: Not performed this session; pt falling asleep mid exercise 5 times prior to completion of session.   Ambulation/Gait             General Gait Details: Not performed this session; pt falling asleep mid exercise 5 times prior to completion of  session.    Stairs            Wheelchair Mobility    Modified Rankin (Stroke Patients Only)       Balance                                    Cognition Arousal/Alertness: Lethargic Behavior During Therapy: WFL for tasks assessed/performed Overall Cognitive Status: Difficult to assess       Memory: Decreased short-term memory;Decreased recall of precautions (Pt able to visually demonstrate hip flexion limitations improsed by precautions. 1 of 3 only. )              Exercises Total Joint Exercises Quad Sets: Right;Strengthening;15 reps;Supine Short Arc Quad: Right;15 reps;Supine;AAROM (requires max assist for full range. ) Heel Slides: AAROM;Right;15 reps;Supine (pt received in hooklying c hips at 70 degrees, heel slides most painful approaching neutral. ) Hip ABduction/ADduction: AAROM;Right;15 reps;Supine (max assist, very limited abduction ROM. ) Bridges: Both;15 reps;Supine (inititation only due to weakness, unable to leave bed. falls asleep 4 times before 15 reps can be completed. ) Other Exercises Other Exercises: R LE manually resisted Leg press 1x15.     General Comments        Pertinent Vitals/Pain Pain Assessment: 0-10 Pain Score: 10-Worst pain ever Pain Location: When asked about pain, pt reports that he back hurts similar to baseline. When further questioned about R hip, pt reports it has been very bad today rating  a 10/10.  Pain Descriptors / Indicators: Aching Pain Intervention(s): Limited activity within patient's tolerance;Monitored during session    Home Living                      Prior Function            PT Goals (current goals can now be found in the care plan section) Acute Rehab PT Goals Patient Stated Goal: control the pain PT Goal Formulation: With patient Time For Goal Achievement: 08/09/15 Potential to Achieve Goals: Fair Progress towards PT goals: PT to reassess next treatment    Frequency  BID     PT Plan Current plan remains appropriate    Co-evaluation             End of Session   Activity Tolerance: Patient limited by pain;Treatment limited secondary to agitation Patient left: in bed;with bed alarm set;with call bell/phone within reach     Time: 1105-1118 PT Time Calculation (min) (ACUTE ONLY): 13 min  Charges:  $Therapeutic Exercise: 8-22 mins                    G Codes:      Buccola,Allan C August 11, 2015, 11:30 AM  11:32 AM  Etta Grandchild, PT, DPT [place] License # [id number]

## 2015-07-29 NOTE — Progress Notes (Signed)
  Subjective: 4 Days Post-Op Procedure(s) (LRB): ARTHROPLASTY BIPOLAR HIP (HEMIARTHROPLASTY) (Right) Patient reports pain as mild.   Patient is doing ok, but is confused this morning.  Denies any issues swallowing during the night. Plan is to go Skilled nursing facility after hospital stay. Negative for chest pain and shortness of breath Fever: no Gastrointestinal:Negative for nausea and vomiting  Objective: Vital signs in last 24 hours: Temp:  [97.7 F (36.5 C)-98.2 F (36.8 C)] 98.2 F (36.8 C) (12/05 0029) Pulse Rate:  [59-85] 59 (12/05 0029) Resp:  [18-20] 20 (12/05 0029) BP: (124-162)/(46-65) 124/65 mmHg (12/05 0029) SpO2:  [95 %-100 %] 95 % (12/05 0029)  Intake/Output from previous day:  Intake/Output Summary (Last 24 hours) at 07/29/15 0729 Last data filed at 07/28/15 1555  Gross per 24 hour  Intake      0 ml  Output      0 ml  Net      0 ml    Intake/Output this shift:    Labs: No results for input(s): HGB in the last 72 hours. No results for input(s): WBC, RBC, HCT, PLT in the last 72 hours.  Recent Labs  07/27/15 0444 07/28/15 0425  NA 140 137  K 4.6 4.5  CL 107 108  CO2 25 24  BUN 40* 35*  CREATININE 1.36* 1.04*  GLUCOSE 257* 167*  CALCIUM 8.1* 7.9*   No results for input(s): LABPT, INR in the last 72 hours.   EXAM General - Patient is Alert, Confused and Lacking, pt unable to answer questions pertaining to location and date. Extremity - Neurologically intact ABD soft Sensation intact distally Intact pulses distally Dorsiflexion/Plantar flexion intact Incision: dressing C/D/I No cellulitis present Dressing/Incision - clean, dry, no drainage Motor Function - intact, moving foot and toes well on exam.   Abdomen soft with normal BS.  Past Medical History  Diagnosis Date  . Hypertension   . Diabetes mellitus without complication ([place])   . Allergy   . Depression   . COPD (chronic obstructive pulmonary disease) ([place])   . A-fib ([place])   .  CAD (coronary artery disease)   . Chronic diastolic heart failure ([place])   . Pulmonary HTN ([place])   . CKD (chronic kidney disease), stage III   . Breast cancer ([facility])     Assessment/Plan: 4 Days Post-Op Procedure(s) (LRB): ARTHROPLASTY BIPOLAR HIP (HEMIARTHROPLASTY) (Right) Active Problems:   Hip subluxation ([facility])  Estimated body mass index is 21.14 kg/(m^2) as calculated from the following:   Height as of this encounter: 5\' 4"  (1.626 m).   Weight as of this encounter: 55.883 kg (123 lb 3.2 oz). Advance diet Up with therapy  Plan on discharge to skilled nursing when medically cleared.  DVT Prophylaxis - Lovenox, Foot Pumps and TED hose Weight-Bearing as tolerated to right leg  J. Cameron Proud, PA-C [facility] Orthopaedic Surgery 07/29/2015, 7:29 AM

## 2015-07-29 NOTE — Progress Notes (Signed)
Pt is medically stable to return to [facility]. Report called to RN, pt going to room 410. VS stable at discharge, EMS provided transportation via stretcher/ambulance.

## 2015-07-29 NOTE — Progress Notes (Signed)
Spoke with Arbie Cookey, [facility] rep at [phone], to notify of non-emergent EMS transport.  Auth notification reference given as [id number].   Service date range good from 07/29/15 - 10/27/15.   Gap exception requested to determine if services can be considered at an in-network level.

## 2015-07-29 NOTE — Discharge Summary (Signed)
[facility] [place] NAME: Anita Wilson    MR#:  [id number]  DATE OF BIRTH:  Aug 01, 1946  DATE OF ADMISSION:  07/24/2015 ADMITTING PHYSICIAN: Harrie Foreman, MD  DATE OF DISCHARGE: 07/29/2015  PRIMARY CARE PHYSICIAN: Alessandra Grout, MD    ADMISSION DIAGNOSIS:  Right hip pain [M25.551] Status post-operative repair of hip fracture [Z98.890, Z87.81]   DISCHARGE DIAGNOSIS:  Right hip screw dislocation  Delirium Acute on chronic kidney injury SECONDARY DIAGNOSIS:   Past Medical History  Diagnosis Date  . Hypertension   . Diabetes mellitus without complication ([place])   . Allergy   . Depression   . COPD (chronic obstructive pulmonary disease) ([place])   . A-fib ([place])   . CAD (coronary artery disease)   . Chronic diastolic heart failure ([place])   . Pulmonary HTN ([place])   . CKD (chronic kidney disease), stage III   . Breast cancer [facility])     HOSPITAL COURSE:   1. Delirium: Possibly secondary to dehydration given acute on chronic kidney injury.  Avoid benzodiazepines. Dr. Boyce Medici spoke to her daughter on phone- as per her- before the previous admission- pt was living at home alone- and was completely alert and oriented. Since the last admission in hospital- she is confused. CT head and infection work ups negative. Improving. She is alert, awake, times 2-3.  2. Acute on chronic kidney injury: Improved with rehydration. Avoid nephrotoxic agents.   3. Right hip screw dislocation-   S/p hemiarthroplasty 07/25/15. Continue PT and DVT prophylaxis.  4. CAD: Status post CABG. Continue aspirin, Lipitor, Coreg and Cardizem.  5. Hypertension: controlled. Continue home medications. Hydralazine when necessary.  6. Diabetes mellitus type 2: oral hypoglycemic agent was on hold. Continue Lantus 6 units subcutaneous chest and continue sliding scale.  7. COPD: Continue Spiriva  DISCHARGE CONDITIONS:   Stable, discharge to  SNF today.  CONSULTS OBTAINED:  Treatment Team:  Corky Mull, MD Leotis Pain, MD Skeet Latch, MD Wellington Hampshire, MD  DRUG ALLERGIES:   Allergies  Allergen Reactions  . Bee Venom Shortness Of Breath and Swelling  . Diphenhydramine Other (See Comments)    Reaction:  Unknown   . Morphine Other (See Comments)    Reaction:  Hallucinations   . Phenylalanine Diarrhea and Nausea And Vomiting  . Sulfa Antibiotics Other (See Comments)    Reaction:  Unknown     DISCHARGE MEDICATIONS:   Current Discharge Medication List    START taking these medications   Details  diltiazem (CARDIZEM) 60 MG tablet Take 1 tablet (60 mg total) by mouth every 6 (six) hours. Qty: 120 tablet, Refills: 0    oxyCODONE (OXY IR/ROXICODONE) 5 MG immediate release tablet Take 1-2 tablets (5-10 mg total) by mouth every 4 (four) hours as needed for breakthrough pain. Qty: 30 tablet, Refills: 0      CONTINUE these medications which have CHANGED   Details  enoxaparin (LOVENOX) 40 MG/0.4ML injection Inject 0.4 mLs (40 mg total) into the skin daily. Qty: 14 Syringe, Refills: 0      CONTINUE these medications which have NOT CHANGED   Details  acetaminophen (TYLENOL) 325 MG tablet Take 650 mg by mouth every 4 (four) hours as needed for mild pain.    alendronate (FOSAMAX) 70 MG tablet Take 70 mg by mouth once a week. Pt takes on Monday. Take with a full glass of water on an empty stomach.    aspirin EC 81 MG  tablet Take 81 mg by mouth daily.    atorvastatin (LIPITOR) 40 MG tablet Take 40 mg by mouth daily.    bisacodyl (DULCOLAX) 10 MG suppository Place 1 suppository (10 mg total) rectally daily as needed for moderate constipation. Qty: 12 suppository, Refills: 0    carvedilol (COREG) 25 MG tablet Take 25 mg by mouth 2 (two) times daily.    cholecalciferol (VITAMIN D) 1000 UNITS tablet Take 1,000 Units by mouth daily.    citalopram (CELEXA) 10 MG tablet Take 10 mg by mouth daily.     EPINEPHrine (EPIPEN JR 2-PAK) 0.15 MG/0.3ML injection Inject 0.15 mg into the muscle as needed for anaphylaxis.    ferrous sulfate 325 (65 FE) MG tablet Take 1 tablet (325 mg total) by mouth 3 (three) times daily after meals. Qty: 90 tablet, Refills: 3    fluticasone (FLONASE) 50 MCG/ACT nasal spray Place 2 sprays into both nostrils daily as needed for rhinitis.    glipiZIDE (GLUCOTROL) 10 MG tablet Take 10 mg by mouth 2 (two) times daily.    hydrALAZINE (APRESOLINE) 25 MG tablet Take 25 mg by mouth 2 (two) times daily.    insulin aspart (NOVOLOG) 100 UNIT/ML injection Inject 3-13 Units into the skin 3 (three) times daily. Pt uses per sliding scale:    60-150:  0 units  151-200:  3 units  201-250:  5 units  251-300:  7 units  301-350:  9 units  351-400:  11 units  401-451:  13 units    insulin glargine (LANTUS) 100 UNIT/ML injection Inject 0.1 mLs (10 Units total) into the skin at bedtime. Qty: 10 mL, Refills: 11    isosorbide mononitrate (IMDUR) 30 MG 24 hr tablet Take 30 mg by mouth daily.    letrozole (FEMARA) 2.5 MG tablet Take 2.5 mg by mouth daily.    metaxalone (SKELAXIN) 400 MG tablet Take 400 mg by mouth every 4 (four) hours as needed for muscle spasms.    Multiple Vitamin (MULTIVITAMIN WITH MINERALS) TABS tablet Take 1 tablet by mouth daily.    omeprazole (PRILOSEC) 20 MG capsule Take 20 mg by mouth 2 (two) times daily.    tiotropium (SPIRIVA) 18 MCG inhalation capsule Place 18 mcg into inhaler and inhale daily.      STOP taking these medications     oxyCODONE-acetaminophen (ROXICET) 5-325 MG tablet          DISCHARGE INSTRUCTIONS:    If you experience worsening of your admission symptoms, develop shortness of breath, life threatening emergency, suicidal or homicidal thoughts you must seek medical attention immediately by calling 911 or calling your MD immediately  if symptoms less severe.  You Must read complete instructions/literature along with all the  possible adverse reactions/side effects for all the Medicines you take and that have been prescribed to you. Take any new Medicines after you have completely understood and accept all the possible adverse reactions/side effects.   Please note  You were cared for by a hospitalist during your hospital stay. If you have any questions about your discharge medications or the care you received while you were in the hospital after you are discharged, you can call the unit and asked to speak with the hospitalist on call if the hospitalist that took care of you is not available. Once you are discharged, your primary care physician will handle any further medical issues. Please note that NO REFILLS for any discharge medications will be authorized once you are discharged, as it is imperative  that you return to your primary care physician (or establish a relationship with a primary care physician if you do not have one) for your aftercare needs so that they can reassess your need for medications and monitor your lab values.    Today   SUBJECTIVE   No complaint.   VITAL SIGNS:  Blood pressure 148/45, pulse 67, temperature 98.2 F (36.8 C), temperature source Oral, resp. rate 20, height 5\' 4"  (1.626 m), weight 55.883 kg (123 lb 3.2 oz), SpO2 97 %.  I/O:   Intake/Output Summary (Last 24 hours) at 07/29/15 1137 Last data filed at 07/28/15 1555  Gross per 24 hour  Intake      0 ml  Output      0 ml  Net      0 ml    PHYSICAL EXAMINATION:  GENERAL:  69 y.o.-year-old patient lying in the bed with no acute distress. Thin. EYES: Pupils equal, round, reactive to light and accommodation. No scleral icterus. Extraocular muscles intact.  HEENT: Head atraumatic, normocephalic. Oropharynx and nasopharynx clear.  NECK:  Supple, no jugular venous distention. No thyroid enlargement, no tenderness.  LUNGS: Normal breath sounds bilaterally, no wheezing, rales,rhonchi or crepitation. No use of accessory muscles of  respiration.  CARDIOVASCULAR: S1, S2 normal. No murmurs, rubs, or gallops.  ABDOMEN: Soft, non-tender, non-distended. Bowel sounds present. No organomegaly or mass.  EXTREMITIES: No pedal edema, cyanosis, or clubbing.  NEUROLOGIC: Cranial nerves II through XII are intact. Muscle strength 3-4/5 in all extremities. Sensation intact. Gait not checked.  PSYCHIATRIC: The patient is alert and oriented x 2-3.  SKIN: No obvious rash, lesion, or ulcer.   DATA REVIEW:   CBC  Recent Labs Lab 07/25/15 0100  WBC 5.3  HGB 10.4*  HCT 31.3*  PLT 243    Chemistries   Recent Labs Lab 07/24/15 1846  07/28/15 0425  NA 137  < > 137  K 4.8  < > 4.5  CL 100*  < > 108  CO2 29  < > 24  GLUCOSE 231*  < > 167*  BUN 38*  < > 35*  CREATININE 1.25*  < > 1.04*  CALCIUM 8.8*  < > 7.9*  AST 20  --   --   ALT 16  --   --   ALKPHOS 111  --   --   BILITOT 0.4  --   --   < > = values in this interval not displayed.  Cardiac Enzymes  Recent Labs Lab 07/24/15 1846  TROPONINI <0.03    Microbiology Results  Results for orders placed or performed during the hospital encounter of 07/24/15  MRSA PCR Screening     Status: None   Collection Time: 07/25/15  1:12 AM  Result Value Ref Range Status   MRSA by PCR NEGATIVE NEGATIVE Final    Comment:        The GeneXpert MRSA Assay (FDA approved for NASAL specimens only), is one component of a comprehensive MRSA colonization surveillance program. It is not intended to diagnose MRSA infection nor to guide or monitor treatment for MRSA infections.     RADIOLOGY:  Dg Chest 1 View  07/27/2015  CLINICAL DATA:  Lung crackles on clinical exam. EXAM: CHEST 1 VIEW COMPARISON:  07/24/2015 FINDINGS: Postsurgical changes of the thorax are stable. Cardiomediastinal silhouette is stably enlarged. Mediastinal contours appear intact. There is no evidence of focal airspace consolidation, pleural effusion or pneumothorax. Osseous structures are without acute  abnormality. Soft tissues are  grossly normal. IMPRESSION: Stably enlarged cardiac silhouette. No evidence of focal airspace consolidation, pulmonary edema or pleural effusion. Electronically Signed   By: Fidela Salisbury M.D.   On: 07/27/2015 19:27        Management plans discussed with the patient, family and they are in agreement.  CODE STATUS:     Code Status Orders        Start     Ordered   07/25/15 1759  Full code   Continuous     07/25/15 1758      TOTAL TIME TAKING CARE OF THIS PATIENT: 36  minutes.    Demetrios Loll M.D on 07/29/2015 at 11:37 AM  Between 7am to 6pm - Pager - [phone]  After 6pm go to [email or web address] - password [email or web address]  Office  [phone]  CC: Primary care physician; Alessandra Grout, MD

## 2015-07-29 NOTE — Progress Notes (Signed)
Patient is medically stable for D/C back to [facility] today. Per [facility] admissions coordinator at [facility] patient is going to room 410. RN will call report to 400 hall and arrange EMS for transport. Clinical Education officer, museum (CSW) sent D/C Summary, D/C packet and FL2 to [employer] via [employer]. Patient is aware of above. CSW contacted patient's daughter [name] and made her aware of above. Please reconsult if future social work needs arise. CSW signing off.   Blima Rich, [place] [phone]

## 2015-07-30 NOTE — Anesthesia Postprocedure Evaluation (Signed)
Anesthesia Post Note  Patient: Anita Wilson  Procedure(s) Performed: Procedure(s) (LRB): ARTHROPLASTY BIPOLAR HIP (HEMIARTHROPLASTY) (Right)  Patient location during evaluation: PACU Anesthesia Type: Spinal Level of consciousness: awake Pain management: pain level controlled Vital Signs Assessment: post-procedure vital signs reviewed and stable Respiratory status: spontaneous breathing Cardiovascular status: blood pressure returned to baseline Anesthetic complications: no    Last Vitals:  Filed Vitals:   07/29/15 0826 07/29/15 1331  BP: 148/45 130/48  Pulse: 67 61  Temp:  36.7 C  Resp:  6    Last Pain:  Filed Vitals:   07/29/15 1554  PainSc: 0-No pain                 Sahmir Weatherbee S

## 2016-04-13 ENCOUNTER — Ambulatory Visit: Payer: Medicare Other | Admitting: Radiation Oncology

## 2019-01-03 ENCOUNTER — Other Ambulatory Visit: Payer: Self-pay

## 2019-01-03 ENCOUNTER — Emergency Department: Payer: Medicare Other

## 2019-01-03 ENCOUNTER — Encounter: Payer: Self-pay | Admitting: Emergency Medicine

## 2019-01-03 ENCOUNTER — Inpatient Hospital Stay
Admission: EM | Admit: 2019-01-03 | Discharge: 2019-01-06 | DRG: 560 | Disposition: A | Discharge status: Skilled Nursing Facility | Payer: Medicare Other | Attending: Internal Medicine | Admitting: Internal Medicine

## 2019-01-03 DIAGNOSIS — Z1159 Encounter for screening for other viral diseases: Secondary | ICD-10-CM

## 2019-01-03 DIAGNOSIS — E119 Type 2 diabetes mellitus without complications: Secondary | ICD-10-CM | POA: Diagnosis present

## 2019-01-03 DIAGNOSIS — I4891 Unspecified atrial fibrillation: Secondary | ICD-10-CM | POA: Diagnosis present

## 2019-01-03 DIAGNOSIS — Z7951 Long term (current) use of inhaled steroids: Secondary | ICD-10-CM | POA: Diagnosis not present

## 2019-01-03 DIAGNOSIS — M9701XA Periprosthetic fracture around internal prosthetic right hip joint, initial encounter: Secondary | ICD-10-CM | POA: Diagnosis present

## 2019-01-03 DIAGNOSIS — J449 Chronic obstructive pulmonary disease, unspecified: Secondary | ICD-10-CM | POA: Diagnosis present

## 2019-01-03 DIAGNOSIS — Z7982 Long term (current) use of aspirin: Secondary | ICD-10-CM | POA: Diagnosis not present

## 2019-01-03 DIAGNOSIS — Y92009 Unspecified place in unspecified non-institutional (private) residence as the place of occurrence of the external cause: Secondary | ICD-10-CM | POA: Diagnosis not present

## 2019-01-03 DIAGNOSIS — F17210 Nicotine dependence, cigarettes, uncomplicated: Secondary | ICD-10-CM | POA: Diagnosis present

## 2019-01-03 DIAGNOSIS — W19XXXA Unspecified fall, initial encounter: Secondary | ICD-10-CM | POA: Diagnosis present

## 2019-01-03 DIAGNOSIS — Z882 Allergy status to sulfonamides status: Secondary | ICD-10-CM | POA: Diagnosis not present

## 2019-01-03 DIAGNOSIS — Z888 Allergy status to other drugs, medicaments and biological substances status: Secondary | ICD-10-CM | POA: Diagnosis not present

## 2019-01-03 DIAGNOSIS — S72009A Fracture of unspecified part of neck of unspecified femur, initial encounter for closed fracture: Secondary | ICD-10-CM | POA: Diagnosis present

## 2019-01-03 DIAGNOSIS — Z8249 Family history of ischemic heart disease and other diseases of the circulatory system: Secondary | ICD-10-CM | POA: Diagnosis not present

## 2019-01-03 DIAGNOSIS — I5032 Chronic diastolic (congestive) heart failure: Secondary | ICD-10-CM | POA: Diagnosis present

## 2019-01-03 DIAGNOSIS — S72001A Fracture of unspecified part of neck of right femur, initial encounter for closed fracture: Secondary | ICD-10-CM | POA: Diagnosis not present

## 2019-01-03 DIAGNOSIS — I2510 Atherosclerotic heart disease of native coronary artery without angina pectoris: Secondary | ICD-10-CM | POA: Diagnosis present

## 2019-01-03 DIAGNOSIS — Z885 Allergy status to narcotic agent status: Secondary | ICD-10-CM | POA: Diagnosis not present

## 2019-01-03 DIAGNOSIS — Z79899 Other long term (current) drug therapy: Secondary | ICD-10-CM

## 2019-01-03 DIAGNOSIS — I110 Hypertensive heart disease with heart failure: Secondary | ICD-10-CM | POA: Diagnosis present

## 2019-01-03 DIAGNOSIS — Z9010 Acquired absence of unspecified breast and nipple: Secondary | ICD-10-CM | POA: Diagnosis not present

## 2019-01-03 DIAGNOSIS — Z803 Family history of malignant neoplasm of breast: Secondary | ICD-10-CM | POA: Diagnosis not present

## 2019-01-03 DIAGNOSIS — Z91030 Bee allergy status: Secondary | ICD-10-CM | POA: Diagnosis not present

## 2019-01-03 DIAGNOSIS — Z833 Family history of diabetes mellitus: Secondary | ICD-10-CM

## 2019-01-03 DIAGNOSIS — Z951 Presence of aortocoronary bypass graft: Secondary | ICD-10-CM | POA: Diagnosis not present

## 2019-01-03 DIAGNOSIS — Z794 Long term (current) use of insulin: Secondary | ICD-10-CM

## 2019-01-03 DIAGNOSIS — K219 Gastro-esophageal reflux disease without esophagitis: Secondary | ICD-10-CM | POA: Diagnosis present

## 2019-01-03 LAB — CBC WITH DIFFERENTIAL/PLATELET
Abs Immature Granulocytes: 0.02 10*3/uL (ref 0.00–0.07)
Basophils Absolute: 0 10*3/uL (ref 0.0–0.1)
Basophils Relative: 0 %
Eosinophils Absolute: 0 10*3/uL (ref 0.0–0.5)
Eosinophils Relative: 0 %
HCT: 30.9 % — ABNORMAL LOW (ref 36.0–46.0)
Hemoglobin: 10.2 g/dL — ABNORMAL LOW (ref 12.0–15.0)
Immature Granulocytes: 0 %
Lymphocytes Relative: 10 %
Lymphs Abs: 0.6 10*3/uL — ABNORMAL LOW (ref 0.7–4.0)
MCH: 31.6 pg (ref 26.0–34.0)
MCHC: 33 g/dL (ref 30.0–36.0)
MCV: 95.7 fL (ref 80.0–100.0)
Monocytes Absolute: 0.3 10*3/uL (ref 0.1–1.0)
Monocytes Relative: 5 %
Neutro Abs: 5.6 10*3/uL (ref 1.7–7.7)
Neutrophils Relative %: 85 %
Platelets: 93 10*3/uL — ABNORMAL LOW (ref 150–400)
RBC: 3.23 MIL/uL — ABNORMAL LOW (ref 3.87–5.11)
RDW: 13.2 % (ref 11.5–15.5)
WBC: 6.6 10*3/uL (ref 4.0–10.5)
nRBC: 0 % (ref 0.0–0.2)

## 2019-01-03 LAB — COMPREHENSIVE METABOLIC PANEL
ALT: 16 U/L (ref 0–44)
AST: 17 U/L (ref 15–41)
Albumin: 3.5 g/dL (ref 3.5–5.0)
Alkaline Phosphatase: 70 U/L (ref 38–126)
Anion gap: 9 (ref 5–15)
BUN: 52 mg/dL — ABNORMAL HIGH (ref 8–23)
CO2: 19 mmol/L — ABNORMAL LOW (ref 22–32)
Calcium: 8.5 mg/dL — ABNORMAL LOW (ref 8.9–10.3)
Chloride: 113 mmol/L — ABNORMAL HIGH (ref 98–111)
Creatinine, Ser: 1.89 mg/dL — ABNORMAL HIGH (ref 0.44–1.00)
GFR calc Af Amer: 30 mL/min — ABNORMAL LOW (ref >60)
GFR calc non Af Amer: 26 mL/min — ABNORMAL LOW (ref >60)
Glucose, Bld: 142 mg/dL — ABNORMAL HIGH (ref 70–99)
Potassium: 4.6 mmol/L (ref 3.5–5.1)
Sodium: 141 mmol/L (ref 135–145)
Total Bilirubin: 0.5 mg/dL (ref 0.3–1.2)
Total Protein: 7 g/dL (ref 6.5–8.1)

## 2019-01-03 LAB — TYPE AND SCREEN
ABO/RH(D): A POS
Antibody Screen: NEGATIVE

## 2019-01-03 LAB — GLUCOSE, CAPILLARY
Glucose-Capillary: 92 mg/dL (ref 70–99)
Glucose-Capillary: 91 mg/dL (ref 70–99)
Glucose-Capillary: 111 mg/dL — ABNORMAL HIGH (ref 70–99)

## 2019-01-03 LAB — PROTIME-INR
INR: 1.1 (ref 0.8–1.2)
Prothrombin Time: 14.2 seconds (ref 11.4–15.2)

## 2019-01-03 LAB — APTT: aPTT: 39 seconds — ABNORMAL HIGH (ref 24–36)

## 2019-01-03 LAB — SARS CORONAVIRUS 2 BY RT PCR (HOSPITAL ORDER, PERFORMED IN ~~LOC~~ HOSPITAL LAB): SARS Coronavirus 2: NEGATIVE

## 2019-01-03 MED ORDER — CARVEDILOL 25 MG PO TABS
25.0000 mg | ORAL_TABLET | Freq: Two times a day (BID) | ORAL | Status: DC
  Administered 2019-01-03 – 2019-01-06 (×7): 25 mg via ORAL
  Filled 2019-01-03 (×7): qty 1

## 2019-01-03 MED ORDER — ONDANSETRON HCL 4 MG/2ML IJ SOLN: 4.0000 mg | Freq: Four times a day (QID) | INTRAMUSCULAR | Status: DC | PRN

## 2019-01-03 MED ORDER — ACETAMINOPHEN 325 MG PO TABS
650.0000 mg | ORAL_TABLET | ORAL | Status: DC | PRN
  Administered 2019-01-03 – 2019-01-06 (×8): 650 mg via ORAL
  Filled 2019-01-03 (×8): qty 2

## 2019-01-03 MED ORDER — POLYETHYLENE GLYCOL 3350 17 G PO PACK: 17.0000 g | PACK | Freq: Every day | ORAL | Status: DC | PRN

## 2019-01-03 MED ORDER — HYDROMORPHONE HCL 1 MG/ML IJ SOLN: 0.5000 mg | INTRAMUSCULAR | Status: DC | PRN

## 2019-01-03 MED ORDER — ALBUTEROL SULFATE (2.5 MG/3ML) 0.083% IN NEBU: 2.5000 mg | INHALATION_SOLUTION | Freq: Four times a day (QID) | RESPIRATORY_TRACT | Status: DC | PRN

## 2019-01-03 MED ORDER — CITALOPRAM HYDROBROMIDE 10 MG PO TABS
10.0000 mg | ORAL_TABLET | Freq: Every day | ORAL | Status: DC
  Administered 2019-01-03 – 2019-01-06 (×4): 10 mg via ORAL
  Filled 2019-01-03 (×4): qty 1

## 2019-01-03 MED ORDER — DOCUSATE SODIUM 100 MG PO CAPS
100.0000 mg | ORAL_CAPSULE | Freq: Two times a day (BID) | ORAL | Status: DC
  Administered 2019-01-03 – 2019-01-05 (×2): 100 mg via ORAL
  Filled 2019-01-03 (×5): qty 1

## 2019-01-03 MED ORDER — ALBUTEROL SULFATE (2.5 MG/3ML) 0.083% IN NEBU
2.5000 mg | INHALATION_SOLUTION | Freq: Four times a day (QID) | RESPIRATORY_TRACT | Status: DC
  Administered 2019-01-03: 15:00:00 2.5 mg via RESPIRATORY_TRACT
  Filled 2019-01-03: qty 3

## 2019-01-03 MED ORDER — INSULIN ASPART 100 UNIT/ML ~~LOC~~ SOLN
0.0000 [IU] | Freq: Three times a day (TID) | SUBCUTANEOUS | Status: DC
  Administered 2019-01-04: 12:00:00 2 [IU] via SUBCUTANEOUS
  Administered 2019-01-04: 5 [IU] via SUBCUTANEOUS
  Administered 2019-01-05 (×2): 3 [IU] via SUBCUTANEOUS
  Administered 2019-01-05: 5 [IU] via SUBCUTANEOUS
  Administered 2019-01-06: 13:00:00 3 [IU] via SUBCUTANEOUS
  Administered 2019-01-06: 09:00:00 2 [IU] via SUBCUTANEOUS
  Filled 2019-01-03 (×7): qty 1

## 2019-01-03 MED ORDER — ATORVASTATIN CALCIUM 20 MG PO TABS
40.0000 mg | ORAL_TABLET | Freq: Every day | ORAL | Status: DC
  Administered 2019-01-03 – 2019-01-05 (×3): 40 mg via ORAL
  Filled 2019-01-03 (×3): qty 2

## 2019-01-03 MED ORDER — SODIUM CHLORIDE 0.45 % IV SOLN
INTRAVENOUS | Status: DC
  Administered 2019-01-03 – 2019-01-04 (×3): via INTRAVENOUS

## 2019-01-03 MED ORDER — ISOSORBIDE MONONITRATE ER 30 MG PO TB24
30.0000 mg | ORAL_TABLET | Freq: Every day | ORAL | Status: DC
  Administered 2019-01-03 – 2019-01-06 (×4): 30 mg via ORAL
  Filled 2019-01-03 (×4): qty 1

## 2019-01-03 MED ORDER — FERROUS SULFATE 325 (65 FE) MG PO TABS
325.0000 mg | ORAL_TABLET | Freq: Every day | ORAL | Status: DC
  Administered 2019-01-03 – 2019-01-06 (×4): 325 mg via ORAL
  Filled 2019-01-03 (×4): qty 1

## 2019-01-03 MED ORDER — CLONIDINE HCL 0.1 MG PO TABS
0.1000 mg | ORAL_TABLET | Freq: Every day | ORAL | Status: DC
  Administered 2019-01-03 – 2019-01-05 (×3): 0.1 mg via ORAL
  Filled 2019-01-03 (×3): qty 1

## 2019-01-03 MED ORDER — VITAMIN D 25 MCG (1000 UNIT) PO TABS
2000.0000 [IU] | ORAL_TABLET | Freq: Every day | ORAL | Status: DC
  Administered 2019-01-03 – 2019-01-06 (×4): 2000 [IU] via ORAL
  Filled 2019-01-03 (×4): qty 2

## 2019-01-03 MED ORDER — INSULIN ASPART 100 UNIT/ML ~~LOC~~ SOLN: 0.0000 [IU] | Freq: Every day | SUBCUTANEOUS | Status: DC

## 2019-01-03 MED ORDER — INSULIN GLARGINE 100 UNIT/ML ~~LOC~~ SOLN
10.0000 [IU] | Freq: Every day | SUBCUTANEOUS | Status: DC
  Administered 2019-01-03 – 2019-01-04 (×2): 10 [IU] via SUBCUTANEOUS
  Filled 2019-01-03 (×3): qty 0.1

## 2019-01-03 MED ORDER — HYDRALAZINE HCL 10 MG PO TABS
10.0000 mg | ORAL_TABLET | Freq: Three times a day (TID) | ORAL | Status: DC
  Administered 2019-01-03 – 2019-01-06 (×9): 10 mg via ORAL
  Filled 2019-01-03 (×12): qty 1

## 2019-01-03 MED ORDER — TRAZODONE HCL 50 MG PO TABS: 50.0000 mg | ORAL_TABLET | Freq: Every evening | ORAL | Status: DC | PRN

## 2019-01-03 MED ORDER — LETROZOLE 2.5 MG PO TABS
2.5000 mg | ORAL_TABLET | Freq: Every day | ORAL | Status: DC
  Administered 2019-01-03 – 2019-01-06 (×4): 2.5 mg via ORAL
  Filled 2019-01-03 (×4): qty 1

## 2019-01-03 MED ORDER — ONDANSETRON HCL 4 MG PO TABS: 4.0000 mg | ORAL_TABLET | Freq: Four times a day (QID) | ORAL | Status: DC | PRN

## 2019-01-03 MED ORDER — ONDANSETRON HCL 4 MG/2ML IJ SOLN: 4.0000 mg | Freq: Once | INTRAMUSCULAR | Status: DC

## 2019-01-03 MED ORDER — HYDRALAZINE HCL 20 MG/ML IJ SOLN
10.0000 mg | INTRAMUSCULAR | Status: DC | PRN
  Administered 2019-01-05: 05:00:00 10 mg via INTRAVENOUS
  Filled 2019-01-03: qty 1

## 2019-01-03 MED ORDER — TIOTROPIUM BROMIDE MONOHYDRATE 18 MCG IN CAPS
18.0000 ug | ORAL_CAPSULE | Freq: Every morning | RESPIRATORY_TRACT | Status: DC
  Administered 2019-01-03 – 2019-01-06 (×4): 18 ug via RESPIRATORY_TRACT
  Filled 2019-01-03 (×2): qty 5

## 2019-01-03 MED ORDER — FENTANYL CITRATE (PF) 100 MCG/2ML IJ SOLN: 25.0000 ug | INTRAMUSCULAR | Status: DC | PRN

## 2019-01-03 MED ORDER — HYDROCODONE-ACETAMINOPHEN 5-325 MG PO TABS: 1.0000 | ORAL_TABLET | ORAL | Status: DC | PRN

## 2019-01-03 MED ORDER — PANTOPRAZOLE SODIUM 40 MG PO TBEC
40.0000 mg | DELAYED_RELEASE_TABLET | Freq: Every day | ORAL | Status: DC
  Administered 2019-01-03 – 2019-01-06 (×4): 40 mg via ORAL
  Filled 2019-01-03 (×4): qty 1

## 2019-01-03 NOTE — ED Notes (Signed)
Pt alert and oriented. No needs at this time. Waiting on disposition. Rails up X 2 for safety

## 2019-01-03 NOTE — ED Notes (Signed)
EDP in with patient 

## 2019-01-03 NOTE — ED Notes (Signed)
MD in room to assess patient at this time.

## 2019-01-03 NOTE — Progress Notes (Signed)
Family Meeting Note  Advance Directive:yes  Today a meeting took place with the Patient.  Patient is able to participate   The following clinical team members were present during this meeting:MD  The following were discussed:Patient's diagnosis: A. fib, coronary artery disease, heart failure, COPD, diabetes, hypertension, status post fall resulting in right hip fracture, Patient's progosis: Unable to determine and Goals for treatment: Full Code  Additional follow-up to be provided: prn  Time spent during discussion:20 minutes  Gorden Harms, MD

## 2019-01-03 NOTE — ED Notes (Signed)
Ortho consult at bedside

## 2019-01-03 NOTE — ED Triage Notes (Signed)
Pt presents to ED from home via EMS with right upper leg pain. Pt states her foot was asleep when she attempted to walk in her living room. Pt stats she fell landing on her right side. Reports difficulty ambulating. No obvious deformity or bruising noted at this time.

## 2019-01-03 NOTE — Consult Note (Signed)
ORTHOPAEDIC CONSULTATION  REQUESTING PHYSICIAN: Salary, Avel Peace, MD  Chief Complaint:   R hip pain  History of Present Illness: Anita Wilson is a 73 y.o. female who had a fall at home yesterday evening. She notes she was able to ambulate initially with pain. However, pain worsened overnight and is now unable to bear weight on the R side. Pain is described as sharp at its worst and a dull ache at its best.  Pain is rated a 8 out of 10 in severity.  Pain is improved with rest and immobilization.  Pain is worse with weightbearing.   Of note, she had a R hip hemiarthroplasty performed by Dr. Roland Rack in Dec 2016. She had been doing well from this until now. Imaging in the emergency department shows a minimally displaced periprosthetic fracture that involves the greater trochanter extending to the anterior proximal femur.   Past Medical History:  Diagnosis Date  . A-fib ([place])   . Allergy   . Breast cancer ([place])   . CAD (coronary artery disease)   . Chronic diastolic heart failure ([place])   . CKD (chronic kidney disease), stage III ([place])   . COPD (chronic obstructive pulmonary disease) ([place])   . Depression   . Diabetes mellitus without complication ([place])   . Hypertension   . Pulmonary HTN ([place])    Past Surgical History:  Procedure Laterality Date  . APPENDECTOMY    . BREAST LUMPECTOMY WITH AXILLARY LYMPH NODE DISSECTION    . CORONARY ARTERY BYPASS GRAFT    . HIP ARTHROPLASTY Right 07/25/2015   Procedure: ARTHROPLASTY BIPOLAR HIP (HEMIARTHROPLASTY);  Surgeon: Corky Mull, MD;  Location: [facility] ORS;  Service: Orthopedics;  Laterality: Right;  . HIP PINNING,CANNULATED Right 07/10/2015   Procedure: CANNULATED HIP PINNING;  Surgeon: Corky Mull, MD;  Location: [facility] ORS;  Service: Orthopedics;  Laterality: Right;  right  . MASTECTOMY    . PARATHYROIDECTOMY     Social History   Socioeconomic History  . Marital status:  Single    Spouse name: Not on file  . Number of children: Not on file  . Years of education: Not on file  . Highest education level: Not on file  Occupational History  . Not on file  Social Needs  . Financial resource strain: Not on file  . Food insecurity:    Worry: Not on file    Inability: Not on file  . Transportation needs:    Medical: Not on file    Non-medical: Not on file  Tobacco Use  . Smoking status: Current Some Day Smoker    Types: Cigarettes  . Smokeless tobacco: Never Used  Substance and Sexual Activity  . Alcohol use: Yes    Comment: Occassionally  . Drug use: No  . Sexual activity: Not on file  Lifestyle  . Physical activity:    Days per week: Not on file    Minutes per session: Not on file  . Stress: Not on file  Relationships  . Social connections:    Talks on phone: Not on file    Gets together: Not on file    Attends religious service: Not on file    Active member of club or organization: Not on file    Attends meetings of clubs or organizations: Not on file    Relationship status: Not on file  Other Topics Concern  . Not on file  Social History Narrative  . Not on file   Family History  Problem  Relation Age of Onset  . Hypertension Mother   . Diabetes Mother   . Heart disease Father   . Diabetes Father   . Diabetes Sister   . Breast cancer Sister   . Dementia Sister    Allergies  Allergen Reactions  . Bee Venom Shortness Of Breath and Swelling  . Benzodiazepines     Other reaction(s): Confusion  . Diphenhydramine Other (See Comments)    Took tylenol pm and end up in the hospital something wrong with my heart  . Morphine Other (See Comments)    Reaction:  Hallucinations   . Phenylalanine Diarrhea and Nausea And Vomiting  . Sulfa Antibiotics Other (See Comments)    metallic smell and taste   Prior to Admission medications   Medication Sig Start Date End Date Taking? Authorizing Provider  acetaminophen (TYLENOL) 325 MG tablet Take  650 mg by mouth every 4 (four) hours as needed for mild pain.   Yes [provider]  aspirin EC 81 MG tablet Take 81 mg by mouth daily.   Yes [provider]  atorvastatin (LIPITOR) 40 MG tablet Take 40 mg by mouth daily.   Yes [provider]  carvedilol (COREG) 25 MG tablet Take 25 mg by mouth 2 (two) times daily.   Yes [provider]  cholecalciferol (VITAMIN D) 1000 UNITS tablet Take 2,000 Units by mouth daily.    Yes [provider]  citalopram (CELEXA) 10 MG tablet Take 10 mg by mouth daily.   Yes [provider]  cloNIDine (CATAPRES) 0.1 MG tablet TAKE 1 TABLET BY MOUTH  NIGHTLY 05/31/18  Yes [provider]  ferrous sulfate 325 (65 FE) MG tablet Take 1 tablet (325 mg total) by mouth 3 (three) times daily after meals. Patient taking differently: Take 325 mg by mouth daily.  07/12/15  Yes Aldean Jewett, MD  glipiZIDE (GLUCOTROL) 10 MG tablet Take 10 mg by mouth 2 (two) times daily.   Yes [provider]  hydrALAZINE (APRESOLINE) 10 MG tablet Take 10 mg by mouth 3 (three) times daily.    Yes [provider]  hydrochlorothiazide (HYDRODIURIL) 25 MG tablet TAKE 1 TABLET BY MOUTH  DAILY 05/31/18  Yes [provider]  insulin glargine (LANTUS) 100 UNIT/ML injection Inject 0.1 mLs (10 Units total) into the skin at bedtime. Patient taking differently: Inject 12-16 Units into the skin at bedtime.  07/13/15  Yes Aldean Jewett, MD  isosorbide mononitrate (IMDUR) 30 MG 24 hr tablet Take 30 mg by mouth daily.   Yes [provider]  letrozole (FEMARA) 2.5 MG tablet Take 2.5 mg by mouth daily.   Yes [provider]  omeprazole (PRILOSEC) 20 MG capsule Take 20 mg by mouth daily.    Yes [provider]  tiotropium (SPIRIVA) 18 MCG inhalation capsule Place 18 mcg into inhaler and inhale daily.   Yes [provider]   Recent Labs    01/03/19 0630  WBC 6.6  HGB 10.2*  HCT  30.9*  PLT 93*  K 4.6  CL 113*  CO2 19*  BUN 52*  CREATININE 1.89*  GLUCOSE 142*  CALCIUM 8.5*  INR 1.1   Dg Knee 2 Views Right  Result Date: 01/03/2019 CLINICAL DATA:  73 year old female status post fall at home landing on right side with pain. EXAM: RIGHT KNEE - 1-2 VIEW COMPARISON:  Right hip series today. FINDINGS: Sequelae of saphenous vein harvest suspected. Calcified peripheral vascular disease superimposed. No joint effusion. Normal  alignment at the right knee. No acute osseous abnormality identified. IMPRESSION: No acute fracture or dislocation identified about the right knee. Electronically Signed   By: Genevie Ann M.D.   On: 01/03/2019 06:04   Ct Hip Right Wo Contrast  Result Date: 01/03/2019 CLINICAL DATA:  73 year old female status post fall onto right side with hip pain highly suspicious for hip fracture, underlying hip arthroplasty since 2016. Suspicion of subtle nondisplaced fracture on earlier radiographs. EXAM: CT OF THE RIGHT HIP WITHOUT CONTRAST TECHNIQUE: Multidetector CT imaging of the right hip was performed according to the standard protocol. Multiplanar CT image reconstructions were also generated. COMPARISON:  0536 hours today. FINDINGS: Osteopenia with minimally displaced fracture through the anterior proximal femur at the junction of the greater trochanter and shaft. See series 2, images 49 through 60 and sagittal series 5, image 29. No hardware failure is identified. The acetabular component is normally located. The visible right hemipelvis is osteopenic but appears intact. Right inguinal and medial thigh surgical clips. Calcified femoral artery atherosclerosis. Vascular patency is not evaluated in the absence of IV contrast. Negative visible bowel and urinary bladder in the pelvis. IMPRESSION: Confirmed minimally displaced fracture through the anterior proximal femur at the junction of the greater trochanter and shaft. See series 2, images 49-60 and sagittal series 5,  images 29 through 31. Electronically Signed   By: Genevie Ann M.D.   On: 01/03/2019 07:11   Dg Chest Port 1 View  Result Date: 01/03/2019 CLINICAL DATA:  73 year old female preoperative study for suspected proximal right femur fracture. EXAM: PORTABLE CHEST 1 VIEW COMPARISON:  07/27/2015 portable chest and earlier. FINDINGS: AP seated view at 0642 hours. Stable lung volumes at the upper limits of normal to mildly hyperinflated. No pneumothorax, pulmonary edema, pleural effusion or confluent pulmonary opacity. Postoperative changes to the mediastinum and chest wall. Cardiac size and mediastinal contours are within normal limits. Paucity bowel gas in the upper abdomen. Osteopenia. No acute osseous abnormality identified. IMPRESSION: No acute cardiopulmonary abnormality. Electronically Signed   By: Genevie Ann M.D.   On: 01/03/2019 07:13   Dg Hip Unilat W Or Wo Pelvis 2-3 Views Right  Addendum Date: 01/03/2019   ADDENDUM REPORT: 01/03/2019 06:25 ADDENDUM: Study discussed by telephone with Dr. Hinda Kehr on 01/03/2019 at 0612 hours and he advises the patient has convincing clinical symptoms of right hip fracture. On review of the images there is subtle lucency and cortical irregularity identified where the posterior greater trochanter joins the proximal right femoral shaft (arrows on images 1 and 3), new since the postoperative radiograph ON 07/25/2015. CONCLUSION: Positive for nondisplaced proximal right femur fracture at the junction of the trochanter and proximal shaft about the femoral implant. This was discussed with Dr. Karma Greaser. Electronically Signed   By: Genevie Ann M.D.   On: 01/03/2019 06:25   Result Date: 01/03/2019 CLINICAL DATA:  73 year old female status post fall at home landing on right side with pain. EXAM: DG HIP (WITH OR WITHOUT PELVIS) 2-3V RIGHT COMPARISON:  Right hip and pelvis series 07/24/2015, pelvis 07/25/2015. FINDINGS: Right hip arthroplasty redemonstrated. Hardware components appear intact  and normally aligned with the right acetabulum. Osteopenia. The pelvis appears stable and intact. Right inguinal surgical clips and bilateral calcified iliofemoral atherosclerosis again noted. Grossly intact proximal left femur. Visible right femur appears intact. No acute osseous abnormality identified. Negative lower abdominal and pelvic visceral contours. IMPRESSION: 1. No acute fracture or dislocation identified about the right hip or pelvis. 2. Previous right hip  arthroplasty. 3. Calcified atherosclerosis. Electronically Signed: By: Genevie Ann M.D. On: 01/03/2019 06:03     Positive ROS: All other systems have been reviewed and were otherwise negative with the exception of those mentioned in the HPI and as above.  Physical Exam: BP (!) 139/55   Pulse 82   Temp 98.2 F (36.8 C) (Oral)   Resp 17   Ht 5\' 4"  (1.626 m)   Wt 49.4 kg   SpO2 98%   BMI 18.71 kg/m  General:  Alert, no acute distress Psychiatric:  Patient is competent for consent with normal mood and affect   Cardiovascular:  No pedal edema, regular rate and rhythm Respiratory:  No wheezing, non-labored breathing GI:  Abdomen is soft and non-tender Skin:  No lesions in the area of chief complaint, no erythema Neurologic:  Sensation intact distally, CN grossly intact Lymphatic:  No axillary or cervical lymphadenopathy  Orthopedic Exam:  RLE: 5/5 DF/PF/EHL SILT s/s/t/sp/dp distr Foot wwp Neg log roll, neg axial load Patient able to flex/ext knee and hip without significant discomfort    Imaging:  As above: minimally displaced periprosthetic fracture that involves the greater trochanter extending to the anterior proximal femur. There is no complete cortical disruption. Stem appears stable.   Assessment/Plan: Anita Wilson is a 73 y.o. female with a minimally displaced, stable appearing proximal femur periprosthetic fracture.    1. I discussed the various treatment options including both surgical and non-surgical  management of her fracture with the patient. Given the minimal displacement and likely stable nature of the fracture pattern, we agreed to proceed with non-surgical management.  2. PT/OT 3. FFWB (if tolerated) or NWB on RLE 4. Patient can follow up with me or Dr. Roland Rack in ~1-2 weeks after discharge for repeat radiographs to re-evaluate stability of stem.    Leim Fabry   01/03/2019 3:05 PM

## 2019-01-03 NOTE — ED Notes (Signed)
ED TO INPATIENT HANDOFF REPORT  ED Nurse Name and Phone #: Mateo Flow 42  S Name/Age/Gender Anita Wilson 73 y.o. female Room/Bed: ED25A/ED25A  Code Status   Code Status: Prior  Home/SNF/Other Home Patient oriented to: self, place, time and situation Is this baseline? Yes   Triage Complete: Triage complete  Chief Complaint Fall  Triage Note Pt presents to ED from home via EMS with right upper leg pain. Pt states her foot was asleep when she attempted to walk in her living room. Pt stats she fell landing on her right side. Reports difficulty ambulating. No obvious deformity or bruising noted at this time.     Allergies Allergies  Allergen Reactions  . Bee Venom Shortness Of Breath and Swelling  . Benzodiazepines     Other reaction(s): Confusion  . Diphenhydramine Other (See Comments)    Took tylenol pm and end up in the hospital something wrong with my heart  . Morphine Other (See Comments)    Reaction:  Hallucinations   . Phenylalanine Diarrhea and Nausea And Vomiting  . Sulfa Antibiotics Other (See Comments)    metallic smell and taste    Level of Care/Admitting Diagnosis ED Disposition    ED Disposition Condition [facility]: [place] [[id number]]  Level of Care: Med-Surg [16]  Covid Evaluation: N/A  Diagnosis: Hip fracture [facility]) [[id number]]  Admitting Physician: Gorden Harms [[id number]]  Attending Physician: Gorden Harms [[id number]]  Estimated length of stay: past midnight tomorrow  Certification:: I certify this patient will need inpatient services for at least 2 midnights  PT Class (Do Not Modify): Inpatient [101]  PT Acc Code (Do Not Modify): Private [1]       B Medical/Surgery History Past Medical History:  Diagnosis Date  . A-fib ([place])   . Allergy   . Breast cancer ([place])   . CAD (coronary artery disease)   . Chronic diastolic heart failure ([place])   . CKD (chronic kidney disease), stage III ([place])    . COPD (chronic obstructive pulmonary disease) ([place])   . Depression   . Diabetes mellitus without complication ([place])   . Hypertension   . Pulmonary HTN ([place])    Past Surgical History:  Procedure Laterality Date  . APPENDECTOMY    . BREAST LUMPECTOMY WITH AXILLARY LYMPH NODE DISSECTION    . CORONARY ARTERY BYPASS GRAFT    . HIP ARTHROPLASTY Right 07/25/2015   Procedure: ARTHROPLASTY BIPOLAR HIP (HEMIARTHROPLASTY);  Surgeon: Corky Mull, MD;  Location: [facility] ORS;  Service: Orthopedics;  Laterality: Right;  . HIP PINNING,CANNULATED Right 07/10/2015   Procedure: CANNULATED HIP PINNING;  Surgeon: Corky Mull, MD;  Location: [facility] ORS;  Service: Orthopedics;  Laterality: Right;  right  . MASTECTOMY    . PARATHYROIDECTOMY       A IV Location/Drains/Wounds Patient Lines/Drains/Airways Status   Active Line/Drains/Airways    Name:   Placement date:   Placement time:   Site:   Days:   Peripheral IV 01/03/19 Left Forearm   01/03/19    0643    Forearm   less than 1   Incision (Closed) 07/10/15 Hip Right   07/10/15    1716     1273   Incision (Closed) 07/25/15 Leg Right   07/25/15    1534     1258          Intake/Output Last 24 hours No intake or output data in the 24 hours ending 01/03/19 0942  Labs/Imaging Results for orders placed or performed during the hospital encounter of 01/03/19 (from the past 48 hour(s))  Comprehensive metabolic panel     Status: Abnormal   Collection Time: 01/03/19  6:30 AM  Result Value Ref Range   Sodium 141 135 - 145 mmol/L   Potassium 4.6 3.5 - 5.1 mmol/L   Chloride 113 (H) 98 - 111 mmol/L   CO2 19 (L) 22 - 32 mmol/L   Glucose, Bld 142 (H) 70 - 99 mg/dL   BUN 52 (H) 8 - 23 mg/dL   Creatinine, Ser 1.89 (H) 0.44 - 1.00 mg/dL   Calcium 8.5 (L) 8.9 - 10.3 mg/dL   Total Protein 7.0 6.5 - 8.1 g/dL   Albumin 3.5 3.5 - 5.0 g/dL   AST 17 15 - 41 U/L   ALT 16 0 - 44 U/L   Alkaline Phosphatase 70 38 - 126 U/L   Total Bilirubin 0.5 0.3 - 1.2 mg/dL   GFR  calc non Af Amer 26 (L) >60 mL/min   GFR calc Af Amer 30 (L) >60 mL/min   Anion gap 9 5 - 15    Comment: Performed at [facility], [address] [place], [place] [address]  CBC WITH DIFFERENTIAL     Status: Abnormal   Collection Time: 01/03/19  6:30 AM  Result Value Ref Range   WBC 6.6 4.0 - 10.5 K/uL   RBC 3.23 (L) 3.87 - 5.11 MIL/uL   Hemoglobin 10.2 (L) 12.0 - 15.0 g/dL   HCT 30.9 (L) 36.0 - 46.0 %   MCV 95.7 80.0 - 100.0 fL   MCH 31.6 26.0 - 34.0 pg   MCHC 33.0 30.0 - 36.0 g/dL   RDW 13.2 11.5 - 15.5 %   Platelets 93 (L) 150 - 400 K/uL    Comment: Immature Platelet Fraction may be clinically indicated, consider ordering this additional test [id number]    nRBC 0.0 0.0 - 0.2 %   Neutrophils Relative % 85 %   Neutro Abs 5.6 1.7 - 7.7 K/uL   Lymphocytes Relative 10 %   Lymphs Abs 0.6 (L) 0.7 - 4.0 K/uL   Monocytes Relative 5 %   Monocytes Absolute 0.3 0.1 - 1.0 K/uL   Eosinophils Relative 0 %   Eosinophils Absolute 0.0 0.0 - 0.5 K/uL   Basophils Relative 0 %   Basophils Absolute 0.0 0.0 - 0.1 K/uL   Immature Granulocytes 0 %   Abs Immature Granulocytes 0.02 0.00 - 0.07 K/uL    Comment: Performed at [facility], [address] [place], [place] [address]  APTT     Status: Abnormal   Collection Time: 01/03/19  6:30 AM  Result Value Ref Range   aPTT 39 (H) 24 - 36 seconds    Comment:        IF BASELINE aPTT IS ELEVATED, SUGGEST PATIENT RISK ASSESSMENT BE USED TO DETERMINE APPROPRIATE ANTICOAGULANT THERAPY. Performed at [facility], [address] [place], [place] [address]   Protime-INR     Status: None   Collection Time: 01/03/19  6:30 AM  Result Value Ref Range   Prothrombin Time 14.2 11.4 - 15.2 seconds   INR 1.1 0.8 - 1.2    Comment: (NOTE) INR goal varies based on device and disease states. Performed at [facility], [address] [place],  [address]   SARS Coronavirus 2 (CEPHEID - Performed in [facility]  hospital lab), [facility] Order     Status: None  Collection Time: 01/03/19  6:30 AM  Result Value Ref Range   SARS Coronavirus 2 NEGATIVE NEGATIVE    Comment: (NOTE) If result is NEGATIVE SARS-CoV-2 target nucleic acids are NOT DETECTED. The SARS-CoV-2 RNA is generally detectable in upper and lower  respiratory specimens during the acute phase of infection. The lowest  concentration of SARS-CoV-2 viral copies this assay can detect is 250  copies / mL. A negative result does not preclude SARS-CoV-2 infection  and should not be used as the sole basis for treatment or other  patient management decisions.  A negative result may occur with  improper specimen collection / handling, submission of specimen other  than nasopharyngeal swab, presence of viral mutation(s) within the  areas targeted by this assay, and inadequate number of viral copies  (<250 copies / mL). A negative result must be combined with clinical  observations, patient history, and epidemiological information. If result is POSITIVE SARS-CoV-2 target nucleic acids are DETECTED. The SARS-CoV-2 RNA is generally detectable in upper and lower  respiratory specimens dur ing the acute phase of infection.  Positive  results are indicative of active infection with SARS-CoV-2.  Clinical  correlation with patient history and other diagnostic information is  necessary to determine patient infection status.  Positive results do  not rule out bacterial infection or co-infection with other viruses. If result is PRESUMPTIVE POSTIVE SARS-CoV-2 nucleic acids MAY BE PRESENT.   A presumptive positive result was obtained on the submitted specimen  and confirmed on repeat testing.  While 2019 novel coronavirus  (SARS-CoV-2) nucleic acids may be present in the submitted sample  additional confirmatory testing may be necessary for epidemiological  and / or clinical management purposes  to differentiate between  SARS-CoV-2 and other Sarbecovirus  currently known to infect humans.  If clinically indicated additional testing with an alternate test  methodology [phone]) is advised. The SARS-CoV-2 RNA is generally  detectable in upper and lower respiratory sp ecimens during the acute  phase of infection. The expected result is Negative. Fact Sheet for Patients:  StrictlyIdeas.no Fact Sheet for Healthcare Providers: BankingDealers.co.za This test is not yet approved or cleared by the [place] FDA and has been authorized for detection and/or diagnosis of SARS-CoV-2 by FDA under an Emergency Use Authorization (EUA).  This EUA will remain in effect (meaning this test can be used) for the duration of the COVID-19 declaration under Section 564(b)(1) of the Act, 21 U.S.C. section 360bbb-3(b)(1), unless the authorization is terminated or revoked sooner. Performed at [facility], [address] [place], [place] [address]   Type and screen     Status: None (Preliminary result)   Collection Time: 01/03/19  6:36 AM  Result Value Ref Range   ABO/RH(D) PENDING    Antibody Screen PENDING    Sample Expiration      01/06/2019,2359 Performed at [facility], [address] [place], [place] [address]   Type and screen Ordered by PROVIDER DEFAULT     Status: None (Preliminary result)   Collection Time: 01/03/19  7:50 AM  Result Value Ref Range   ABO/RH(D) PENDING    Antibody Screen PENDING    Sample Expiration      01/06/2019,2359 Performed at [facility], [address] [place], [place] [address]   Type and screen Ordered by PROVIDER DEFAULT     Status: None   Collection Time: 01/03/19  8:41 AM  Result Value Ref Range   ABO/RH(D) A POS    Antibody  Screen NEG    Sample Expiration      01/06/2019,2359 Performed at [facility], [address] [place],  [address]    Dg Knee 2 Views Right  Result Date: 01/03/2019 CLINICAL DATA:   73 year old female status post fall at home landing on right side with pain. EXAM: RIGHT KNEE - 1-2 VIEW COMPARISON:  Right hip series today. FINDINGS: Sequelae of saphenous vein harvest suspected. Calcified peripheral vascular disease superimposed. No joint effusion. Normal alignment at the right knee. No acute osseous abnormality identified. IMPRESSION: No acute fracture or dislocation identified about the right knee. Electronically Signed   By: Genevie Ann M.D.   On: 01/03/2019 06:04   Ct Hip Right Wo Contrast  Result Date: 01/03/2019 CLINICAL DATA:  73 year old female status post fall onto right side with hip pain highly suspicious for hip fracture, underlying hip arthroplasty since 2016. Suspicion of subtle nondisplaced fracture on earlier radiographs. EXAM: CT OF THE RIGHT HIP WITHOUT CONTRAST TECHNIQUE: Multidetector CT imaging of the right hip was performed according to the standard protocol. Multiplanar CT image reconstructions were also generated. COMPARISON:  0536 hours today. FINDINGS: Osteopenia with minimally displaced fracture through the anterior proximal femur at the junction of the greater trochanter and shaft. See series 2, images 49 through 60 and sagittal series 5, image 29. No hardware failure is identified. The acetabular component is normally located. The visible right hemipelvis is osteopenic but appears intact. Right inguinal and medial thigh surgical clips. Calcified femoral artery atherosclerosis. Vascular patency is not evaluated in the absence of IV contrast. Negative visible bowel and urinary bladder in the pelvis. IMPRESSION: Confirmed minimally displaced fracture through the anterior proximal femur at the junction of the greater trochanter and shaft. See series 2, images 49-60 and sagittal series 5, images 29 through 31. Electronically Signed   By: Genevie Ann M.D.   On: 01/03/2019 07:11   Dg Chest Port 1 View  Result Date: 01/03/2019 CLINICAL DATA:  73 year old female preoperative  study for suspected proximal right femur fracture. EXAM: PORTABLE CHEST 1 VIEW COMPARISON:  07/27/2015 portable chest and earlier. FINDINGS: AP seated view at 0642 hours. Stable lung volumes at the upper limits of normal to mildly hyperinflated. No pneumothorax, pulmonary edema, pleural effusion or confluent pulmonary opacity. Postoperative changes to the mediastinum and chest wall. Cardiac size and mediastinal contours are within normal limits. Paucity bowel gas in the upper abdomen. Osteopenia. No acute osseous abnormality identified. IMPRESSION: No acute cardiopulmonary abnormality. Electronically Signed   By: Genevie Ann M.D.   On: 01/03/2019 07:13   Dg Hip Unilat W Or Wo Pelvis 2-3 Views Right  Addendum Date: 01/03/2019   ADDENDUM REPORT: 01/03/2019 06:25 ADDENDUM: Study discussed by telephone with Dr. Hinda Kehr on 01/03/2019 at 0612 hours and he advises the patient has convincing clinical symptoms of right hip fracture. On review of the images there is subtle lucency and cortical irregularity identified where the posterior greater trochanter joins the proximal right femoral shaft (arrows on images 1 and 3), new since the postoperative radiograph ON 07/25/2015. CONCLUSION: Positive for nondisplaced proximal right femur fracture at the junction of the trochanter and proximal shaft about the femoral implant. This was discussed with Dr. Karma Greaser. Electronically Signed   By: Genevie Ann M.D.   On: 01/03/2019 06:25   Result Date: 01/03/2019 CLINICAL DATA:  73 year old female status post fall at home landing on right side with pain. EXAM: DG HIP (WITH OR WITHOUT PELVIS) 2-3V RIGHT COMPARISON:  Right  hip and pelvis series 07/24/2015, pelvis 07/25/2015. FINDINGS: Right hip arthroplasty redemonstrated. Hardware components appear intact and normally aligned with the right acetabulum. Osteopenia. The pelvis appears stable and intact. Right inguinal surgical clips and bilateral calcified iliofemoral atherosclerosis again  noted. Grossly intact proximal left femur. Visible right femur appears intact. No acute osseous abnormality identified. Negative lower abdominal and pelvic visceral contours. IMPRESSION: 1. No acute fracture or dislocation identified about the right hip or pelvis. 2. Previous right hip arthroplasty. 3. Calcified atherosclerosis. Electronically Signed: By: Genevie Ann M.D. On: 01/03/2019 06:03    Pending Labs [employer] (From admission, onward)    Start     Ordered   Signed and Held  CBC  Tomorrow morning,   R     Signed and Held   Signed and Held  Basic metabolic panel  Tomorrow morning,   R     Signed and Held          Vitals/Pain Today's Vitals   01/03/19 0434 01/03/19 0435 01/03/19 0713 01/03/19 0830  BP:   (!) 155/67 137/63  Pulse:   86 84  Resp:   19 20  Temp:      TempSrc:      SpO2:   96% 95%  Weight:  49.4 kg    Height:  5\' 4"  (1.626 m)    PainSc: 9        Isolation Precautions No active isolations  Medications Medications  fentaNYL (SUBLIMAZE) injection 25 mcg (has no administration in time range)  ondansetron (ZOFRAN) injection 4 mg (4 mg Intravenous Refused 01/03/19 0715)    Mobility walks Low fall risk   Focused Assessments fell when got up from chair because foot asleep. small proximal femur fracture.   R Recommendations: See Admitting Provider Note  Report given to:   Additional Notes:

## 2019-01-03 NOTE — ED Notes (Signed)
Admitting MD at bedside.

## 2019-01-03 NOTE — ED Notes (Signed)
ED TO INPATIENT HANDOFF REPORT  ED Nurse Name and Phone #: [phone]   S Name/Age/Gender Anita Wilson 73 y.o. female Room/Bed: ED25A/ED25A  Code Status   Code Status: Prior  Home/SNF/Other Home Patient oriented to: self, place, time and situation Is this baseline? Yes   Triage Complete: Triage complete  Chief Complaint Fall  Triage Note Pt presents to ED from home via EMS with right upper leg pain. Pt states her foot was asleep when she attempted to walk in her living room. Pt stats she fell landing on her right side. Reports difficulty ambulating. No obvious deformity or bruising noted at this time.     Allergies Allergies  Allergen Reactions  . Bee Venom Shortness Of Breath and Swelling  . Benzodiazepines     Other reaction(s): Confusion  . Diphenhydramine Other (See Comments)    Took tylenol pm and end up in the hospital something wrong with my heart  . Morphine Other (See Comments)    Reaction:  Hallucinations   . Phenylalanine Diarrhea and Nausea And Vomiting  . Sulfa Antibiotics Other (See Comments)    metallic smell and taste    Level of Care/Admitting Diagnosis ED Disposition    ED Disposition Condition [facility]: [place] [[id number]]  Level of Care: Med-Surg [16]  Covid Evaluation: N/A  Diagnosis: Hip fracture [facility]) [[id number]]  Admitting Physician: Gorden Harms [[id number]]  Attending Physician: Gorden Harms [[id number]]  Estimated length of stay: past midnight tomorrow  Certification:: I certify this patient will need inpatient services for at least 2 midnights  PT Class (Do Not Modify): Inpatient [101]  PT Acc Code (Do Not Modify): Private [1]       B Medical/Surgery History Past Medical History:  Diagnosis Date  . A-fib ([place])   . Allergy   . Breast cancer ([place])   . CAD (coronary artery disease)   . Chronic diastolic heart failure ([place])   . CKD (chronic kidney disease), stage III ([place])   .  COPD (chronic obstructive pulmonary disease) ([place])   . Depression   . Diabetes mellitus without complication ([place])   . Hypertension   . Pulmonary HTN ([place])    Past Surgical History:  Procedure Laterality Date  . APPENDECTOMY    . BREAST LUMPECTOMY WITH AXILLARY LYMPH NODE DISSECTION    . CORONARY ARTERY BYPASS GRAFT    . HIP ARTHROPLASTY Right 07/25/2015   Procedure: ARTHROPLASTY BIPOLAR HIP (HEMIARTHROPLASTY);  Surgeon: Corky Mull, MD;  Location: [facility] ORS;  Service: Orthopedics;  Laterality: Right;  . HIP PINNING,CANNULATED Right 07/10/2015   Procedure: CANNULATED HIP PINNING;  Surgeon: Corky Mull, MD;  Location: [facility] ORS;  Service: Orthopedics;  Laterality: Right;  right  . MASTECTOMY    . PARATHYROIDECTOMY       A IV Location/Drains/Wounds Patient Lines/Drains/Airways Status   Active Line/Drains/Airways    Name:   Placement date:   Placement time:   Site:   Days:   Peripheral IV 01/03/19 Left Forearm   01/03/19    0643    Forearm   less than 1   Incision (Closed) 07/10/15 Hip Right   07/10/15    1716     1273   Incision (Closed) 07/25/15 Leg Right   07/25/15    1534     1258          Intake/Output Last 24 hours No intake or output data in the 24 hours ending 01/03/19 0955  Labs/Imaging Results for orders placed or performed during the hospital encounter of 01/03/19 (from the past 48 hour(s))  Comprehensive metabolic panel     Status: Abnormal   Collection Time: 01/03/19  6:30 AM  Result Value Ref Range   Sodium 141 135 - 145 mmol/L   Potassium 4.6 3.5 - 5.1 mmol/L   Chloride 113 (H) 98 - 111 mmol/L   CO2 19 (L) 22 - 32 mmol/L   Glucose, Bld 142 (H) 70 - 99 mg/dL   BUN 52 (H) 8 - 23 mg/dL   Creatinine, Ser 1.89 (H) 0.44 - 1.00 mg/dL   Calcium 8.5 (L) 8.9 - 10.3 mg/dL   Total Protein 7.0 6.5 - 8.1 g/dL   Albumin 3.5 3.5 - 5.0 g/dL   AST 17 15 - 41 U/L   ALT 16 0 - 44 U/L   Alkaline Phosphatase 70 38 - 126 U/L   Total Bilirubin 0.5 0.3 - 1.2 mg/dL   GFR calc  non Af Amer 26 (L) >60 mL/min   GFR calc Af Amer 30 (L) >60 mL/min   Anion gap 9 5 - 15    Comment: Performed at [facility], [address] [place], [place] [address]  CBC WITH DIFFERENTIAL     Status: Abnormal   Collection Time: 01/03/19  6:30 AM  Result Value Ref Range   WBC 6.6 4.0 - 10.5 K/uL   RBC 3.23 (L) 3.87 - 5.11 MIL/uL   Hemoglobin 10.2 (L) 12.0 - 15.0 g/dL   HCT 30.9 (L) 36.0 - 46.0 %   MCV 95.7 80.0 - 100.0 fL   MCH 31.6 26.0 - 34.0 pg   MCHC 33.0 30.0 - 36.0 g/dL   RDW 13.2 11.5 - 15.5 %   Platelets 93 (L) 150 - 400 K/uL    Comment: Immature Platelet Fraction may be clinically indicated, consider ordering this additional test [id number]    nRBC 0.0 0.0 - 0.2 %   Neutrophils Relative % 85 %   Neutro Abs 5.6 1.7 - 7.7 K/uL   Lymphocytes Relative 10 %   Lymphs Abs 0.6 (L) 0.7 - 4.0 K/uL   Monocytes Relative 5 %   Monocytes Absolute 0.3 0.1 - 1.0 K/uL   Eosinophils Relative 0 %   Eosinophils Absolute 0.0 0.0 - 0.5 K/uL   Basophils Relative 0 %   Basophils Absolute 0.0 0.0 - 0.1 K/uL   Immature Granulocytes 0 %   Abs Immature Granulocytes 0.02 0.00 - 0.07 K/uL    Comment: Performed at [facility], [address] [place], [place] [address]  APTT     Status: Abnormal   Collection Time: 01/03/19  6:30 AM  Result Value Ref Range   aPTT 39 (H) 24 - 36 seconds    Comment:        IF BASELINE aPTT IS ELEVATED, SUGGEST PATIENT RISK ASSESSMENT BE USED TO DETERMINE APPROPRIATE ANTICOAGULANT THERAPY. Performed at [facility], [address] [place], [place] [address]   Protime-INR     Status: None   Collection Time: 01/03/19  6:30 AM  Result Value Ref Range   Prothrombin Time 14.2 11.4 - 15.2 seconds   INR 1.1 0.8 - 1.2    Comment: (NOTE) INR goal varies based on device and disease states. Performed at [facility], [address], [place], [place] [address]   SARS Coronavirus 2 (CEPHEID - Performed in [facility]  hospital lab), [facility] Order     Status: None  Collection Time: 01/03/19  6:30 AM  Result Value Ref Range   SARS Coronavirus 2 NEGATIVE NEGATIVE    Comment: (NOTE) If result is NEGATIVE SARS-CoV-2 target nucleic acids are NOT DETECTED. The SARS-CoV-2 RNA is generally detectable in upper and lower  respiratory specimens during the acute phase of infection. The lowest  concentration of SARS-CoV-2 viral copies this assay can detect is 250  copies / mL. A negative result does not preclude SARS-CoV-2 infection  and should not be used as the sole basis for treatment or other  patient management decisions.  A negative result may occur with  improper specimen collection / handling, submission of specimen other  than nasopharyngeal swab, presence of viral mutation(s) within the  areas targeted by this assay, and inadequate number of viral copies  (<250 copies / mL). A negative result must be combined with clinical  observations, patient history, and epidemiological information. If result is POSITIVE SARS-CoV-2 target nucleic acids are DETECTED. The SARS-CoV-2 RNA is generally detectable in upper and lower  respiratory specimens dur ing the acute phase of infection.  Positive  results are indicative of active infection with SARS-CoV-2.  Clinical  correlation with patient history and other diagnostic information is  necessary to determine patient infection status.  Positive results do  not rule out bacterial infection or co-infection with other viruses. If result is PRESUMPTIVE POSTIVE SARS-CoV-2 nucleic acids MAY BE PRESENT.   A presumptive positive result was obtained on the submitted specimen  and confirmed on repeat testing.  While 2019 novel coronavirus  (SARS-CoV-2) nucleic acids may be present in the submitted sample  additional confirmatory testing may be necessary for epidemiological  and / or clinical management purposes  to differentiate between  SARS-CoV-2 and other Sarbecovirus  currently known to infect humans.  If clinically indicated additional testing with an alternate test  methodology [id number]) is advised. The SARS-CoV-2 RNA is generally  detectable in upper and lower respiratory sp ecimens during the acute  phase of infection. The expected result is Negative. Fact Sheet for Patients:  StrictlyIdeas.no Fact Sheet for Healthcare Providers: BankingDealers.co.za This test is not yet approved or cleared by the [place] FDA and has been authorized for detection and/or diagnosis of SARS-CoV-2 by FDA under an Emergency Use Authorization (EUA).  This EUA will remain in effect (meaning this test can be used) for the duration of the COVID-19 declaration under Section 564(b)(1) of the Act, 21 U.S.C. section 360bbb-3(b)(1), unless the authorization is terminated or revoked sooner. Performed at [facility], [address] [place], [place] [address]   Type and screen     Status: None (Preliminary result)   Collection Time: 01/03/19  6:36 AM  Result Value Ref Range   ABO/RH(D) PENDING    Antibody Screen PENDING    Sample Expiration      01/06/2019,2359 Performed at [facility], [address] [place], [place] [address]   Type and screen Ordered by PROVIDER DEFAULT     Status: None (Preliminary result)   Collection Time: 01/03/19  7:50 AM  Result Value Ref Range   ABO/RH(D) PENDING    Antibody Screen PENDING    Sample Expiration      01/06/2019,2359 Performed at [facility], [address] [place], [place] [address]   Type and screen Ordered by PROVIDER DEFAULT     Status: None   Collection Time: 01/03/19  8:41 AM  Result Value Ref Range   ABO/RH(D) A POS    Antibody  Screen NEG    Sample Expiration      01/06/2019,2359 Performed at [facility], [address] [place], [place] [address]    Dg Knee 2 Views Right  Result Date: 01/03/2019 CLINICAL DATA:   73 year old female status post fall at home landing on right side with pain. EXAM: RIGHT KNEE - 1-2 VIEW COMPARISON:  Right hip series today. FINDINGS: Sequelae of saphenous vein harvest suspected. Calcified peripheral vascular disease superimposed. No joint effusion. Normal alignment at the right knee. No acute osseous abnormality identified. IMPRESSION: No acute fracture or dislocation identified about the right knee. Electronically Signed   By: Genevie Ann M.D.   On: 01/03/2019 06:04   Ct Hip Right Wo Contrast  Result Date: 01/03/2019 CLINICAL DATA:  73 year old female status post fall onto right side with hip pain highly suspicious for hip fracture, underlying hip arthroplasty since 2016. Suspicion of subtle nondisplaced fracture on earlier radiographs. EXAM: CT OF THE RIGHT HIP WITHOUT CONTRAST TECHNIQUE: Multidetector CT imaging of the right hip was performed according to the standard protocol. Multiplanar CT image reconstructions were also generated. COMPARISON:  0536 hours today. FINDINGS: Osteopenia with minimally displaced fracture through the anterior proximal femur at the junction of the greater trochanter and shaft. See series 2, images 49 through 60 and sagittal series 5, image 29. No hardware failure is identified. The acetabular component is normally located. The visible right hemipelvis is osteopenic but appears intact. Right inguinal and medial thigh surgical clips. Calcified femoral artery atherosclerosis. Vascular patency is not evaluated in the absence of IV contrast. Negative visible bowel and urinary bladder in the pelvis. IMPRESSION: Confirmed minimally displaced fracture through the anterior proximal femur at the junction of the greater trochanter and shaft. See series 2, images 49-60 and sagittal series 5, images 29 through 31. Electronically Signed   By: Genevie Ann M.D.   On: 01/03/2019 07:11   Dg Chest Port 1 View  Result Date: 01/03/2019 CLINICAL DATA:  73 year old female preoperative  study for suspected proximal right femur fracture. EXAM: PORTABLE CHEST 1 VIEW COMPARISON:  07/27/2015 portable chest and earlier. FINDINGS: AP seated view at 0642 hours. Stable lung volumes at the upper limits of normal to mildly hyperinflated. No pneumothorax, pulmonary edema, pleural effusion or confluent pulmonary opacity. Postoperative changes to the mediastinum and chest wall. Cardiac size and mediastinal contours are within normal limits. Paucity bowel gas in the upper abdomen. Osteopenia. No acute osseous abnormality identified. IMPRESSION: No acute cardiopulmonary abnormality. Electronically Signed   By: Genevie Ann M.D.   On: 01/03/2019 07:13   Dg Hip Unilat W Or Wo Pelvis 2-3 Views Right  Addendum Date: 01/03/2019   ADDENDUM REPORT: 01/03/2019 06:25 ADDENDUM: Study discussed by telephone with Dr. Hinda Kehr on 01/03/2019 at 0612 hours and he advises the patient has convincing clinical symptoms of right hip fracture. On review of the images there is subtle lucency and cortical irregularity identified where the posterior greater trochanter joins the proximal right femoral shaft (arrows on images 1 and 3), new since the postoperative radiograph ON 07/25/2015. CONCLUSION: Positive for nondisplaced proximal right femur fracture at the junction of the trochanter and proximal shaft about the femoral implant. This was discussed with Dr. Karma Greaser. Electronically Signed   By: Genevie Ann M.D.   On: 01/03/2019 06:25   Result Date: 01/03/2019 CLINICAL DATA:  73 year old female status post fall at home landing on right side with pain. EXAM: DG HIP (WITH OR WITHOUT PELVIS) 2-3V RIGHT COMPARISON:  Right  hip and pelvis series 07/24/2015, pelvis 07/25/2015. FINDINGS: Right hip arthroplasty redemonstrated. Hardware components appear intact and normally aligned with the right acetabulum. Osteopenia. The pelvis appears stable and intact. Right inguinal surgical clips and bilateral calcified iliofemoral atherosclerosis again  noted. Grossly intact proximal left femur. Visible right femur appears intact. No acute osseous abnormality identified. Negative lower abdominal and pelvic visceral contours. IMPRESSION: 1. No acute fracture or dislocation identified about the right hip or pelvis. 2. Previous right hip arthroplasty. 3. Calcified atherosclerosis. Electronically Signed: By: Genevie Ann M.D. On: 01/03/2019 06:03    Pending Labs Unresulted Labs (From admission, onward)    Start     Ordered   Signed and Held  CBC  Tomorrow morning,   R     Signed and Held   Signed and Held  Basic metabolic panel  Tomorrow morning,   R     Signed and Held          Vitals/Pain Today's Vitals   01/03/19 0435 01/03/19 0713 01/03/19 0830 01/03/19 0930  BP:  (!) 155/67 137/63 (!) 156/62  Pulse:  86 84 86  Resp:  19 20 19   Temp:      TempSrc:      SpO2:  96% 95% 95%  Weight: 49.4 kg     Height: 5\' 4"  (1.626 m)     PainSc:        Isolation Precautions No active isolations  Medications Medications  fentaNYL (SUBLIMAZE) injection 25 mcg (has no administration in time range)  ondansetron (ZOFRAN) injection 4 mg (4 mg Intravenous Refused 01/03/19 0715)    Mobility  nonambulatory due to fracture. At baseline ambulatory Low fall risk   Focused Assessments other   R Recommendations: See Admitting Provider Note  Report given to:   Additional Notes:

## 2019-01-03 NOTE — H&P (Signed)
[facility] NAME: Anita Wilson    MR#:  [id number]  DATE OF BIRTH:  23-May-1946  DATE OF ADMISSION:  01/03/2019  PRIMARY CARE PHYSICIAN: Alessandra Grout, MD   REQUESTING/REFERRING PHYSICIAN:   CHIEF COMPLAINT:   Chief Complaint  Patient presents with  . Fall  . Leg Pain    HISTORY OF PRESENT ILLNESS: Anita Wilson  is a 73 y.o. female with a known history per below, status post fall resulting in hip fracture, patient is now admitted for further evaluation/care.  PAST MEDICAL HISTORY:   Past Medical History:  Diagnosis Date  . A-fib ([place])   . Allergy   . Breast cancer ([place])   . CAD (coronary artery disease)   . Chronic diastolic heart failure ([place])   . CKD (chronic kidney disease), stage III ([place])   . COPD (chronic obstructive pulmonary disease) ([place])   . Depression   . Diabetes mellitus without complication ([place])   . Hypertension   . Pulmonary HTN ([place])     PAST SURGICAL HISTORY:  Past Surgical History:  Procedure Laterality Date  . APPENDECTOMY    . BREAST LUMPECTOMY WITH AXILLARY LYMPH NODE DISSECTION    . CORONARY ARTERY BYPASS GRAFT    . HIP ARTHROPLASTY Right 07/25/2015   Procedure: ARTHROPLASTY BIPOLAR HIP (HEMIARTHROPLASTY);  Surgeon: Corky Mull, MD;  Location: [facility] ORS;  Service: Orthopedics;  Laterality: Right;  . HIP PINNING,CANNULATED Right 07/10/2015   Procedure: CANNULATED HIP PINNING;  Surgeon: Corky Mull, MD;  Location: [facility] ORS;  Service: Orthopedics;  Laterality: Right;  right  . MASTECTOMY    . PARATHYROIDECTOMY      SOCIAL HISTORY:  Social History   Tobacco Use  . Smoking status: Current Some Day Smoker    Types: Cigarettes  . Smokeless tobacco: Never Used  Substance Use Topics  . Alcohol use: Yes    Comment: Occassionally    FAMILY HISTORY:  Family History  Problem Relation Age of Onset  . Hypertension Mother   . Diabetes Mother   . Heart disease Father   .  Diabetes Father   . Diabetes Sister   . Breast cancer Sister   . Dementia Sister     DRUG ALLERGIES:  Allergies  Allergen Reactions  . Bee Venom Shortness Of Breath and Swelling  . Benzodiazepines     Other reaction(s): Confusion  . Diphenhydramine Other (See Comments)    Took tylenol pm and end up in the hospital something wrong with my heart  . Morphine Other (See Comments)    Reaction:  Hallucinations   . Phenylalanine Diarrhea and Nausea And Vomiting  . Sulfa Antibiotics Other (See Comments)    metallic smell and taste    REVIEW OF SYSTEMS:   CONSTITUTIONAL: No fever, fatigue or weakness.  EYES: No blurred or double vision.  EARS, NOSE, AND THROAT: No tinnitus or ear pain.  RESPIRATORY: No cough, shortness of breath, wheezing or hemoptysis.  CARDIOVASCULAR: No chest pain, orthopnea, edema.  GASTROINTESTINAL: No nausea, vomiting, diarrhea or abdominal pain.  GENITOURINARY: No dysuria, hematuria.  ENDOCRINE: No polyuria, nocturia,  HEMATOLOGY: No anemia, easy bruising or bleeding SKIN: No rash or lesion. MUSCULOSKELETAL: Right hip pain    NEUROLOGIC: No tingling, numbness, weakness.  PSYCHIATRY: No anxiety or depression.   MEDICATIONS AT HOME:  Prior to Admission medications   Medication Sig Start Date End Date Taking? Authorizing Provider  acetaminophen (TYLENOL) 325 MG tablet Take 650 mg  by mouth every 4 (four) hours as needed for mild pain.   Yes [provider]  aspirin EC 81 MG tablet Take 81 mg by mouth daily.   Yes [provider]  atorvastatin (LIPITOR) 40 MG tablet Take 40 mg by mouth daily.   Yes [provider]  carvedilol (COREG) 25 MG tablet Take 25 mg by mouth 2 (two) times daily.   Yes [provider]  cholecalciferol (VITAMIN D) 1000 UNITS tablet Take 2,000 Units by mouth daily.    Yes [provider]  citalopram (CELEXA) 10 MG tablet Take 10 mg by mouth daily.   Yes [provider]  cloNIDine  (CATAPRES) 0.1 MG tablet TAKE 1 TABLET BY MOUTH  NIGHTLY 05/31/18  Yes [provider]  ferrous sulfate 325 (65 FE) MG tablet Take 1 tablet (325 mg total) by mouth 3 (three) times daily after meals. Patient taking differently: Take 325 mg by mouth daily.  07/12/15  Yes Aldean Jewett, MD  glipiZIDE (GLUCOTROL) 10 MG tablet Take 10 mg by mouth 2 (two) times daily.   Yes [provider]  hydrALAZINE (APRESOLINE) 10 MG tablet Take 10 mg by mouth 3 (three) times daily.    Yes [provider]  hydrochlorothiazide (HYDRODIURIL) 25 MG tablet TAKE 1 TABLET BY MOUTH  DAILY 05/31/18  Yes [provider]  insulin glargine (LANTUS) 100 UNIT/ML injection Inject 0.1 mLs (10 Units total) into the skin at bedtime. Patient taking differently: Inject 12-16 Units into the skin at bedtime.  07/13/15  Yes Aldean Jewett, MD  isosorbide mononitrate (IMDUR) 30 MG 24 hr tablet Take 30 mg by mouth daily.   Yes [provider]  letrozole (FEMARA) 2.5 MG tablet Take 2.5 mg by mouth daily.   Yes [provider]  omeprazole (PRILOSEC) 20 MG capsule Take 20 mg by mouth daily.    Yes [provider]  tiotropium (SPIRIVA) 18 MCG inhalation capsule Place 18 mcg into inhaler and inhale daily.   Yes [provider]      PHYSICAL EXAMINATION:   VITAL SIGNS: Blood pressure 137/63, pulse 84, temperature 98.2 F (36.8 C), temperature source Oral, resp. rate 20, height 5\' 4"  (1.626 m), weight 49.4 kg, SpO2 95 %.  GENERAL:  73 y.o.-year-old patient lying in the bed with no acute distress.  EYES: Pupils equal, round, reactive to light and accommodation. No scleral icterus. Extraocular muscles intact.  HEENT: Head atraumatic, normocephalic. Oropharynx and nasopharynx clear.  NECK:  Supple, no jugular venous distention. No thyroid enlargement, no tenderness.  LUNGS: Normal breath sounds bilaterally, no wheezing, rales,rhonchi or crepitation. No use of  accessory muscles of respiration.  CARDIOVASCULAR: S1, S2 normal. No murmurs, rubs, or gallops.  ABDOMEN: Soft, nontender, nondistended. Bowel sounds present. No organomegaly or mass.  EXTREMITIES: No pedal edema, cyanosis, or clubbing.  NEUROLOGIC: Cranial nerves II through XII are intact. Muscle strength 5/5 in all extremities. Sensation intact. Gait not checked.  PSYCHIATRIC: The patient is alert and oriented x 3.  SKIN: No obvious rash, lesion, or ulcer.   LABORATORY PANEL:   CBC Recent Labs  Lab 01/03/19 0630  WBC 6.6  HGB 10.2*  HCT 30.9*  PLT 93*  MCV 95.7  MCH 31.6  MCHC 33.0  RDW 13.2  LYMPHSABS 0.6*  MONOABS 0.3  EOSABS 0.0  BASOSABS 0.0   ------------------------------------------------------------------------------------------------------------------  Chemistries  Recent Labs  Lab 01/03/19 0630  NA 141  K 4.6  CL 113*  CO2 19*  GLUCOSE 142*  BUN 52*  CREATININE 1.89*  CALCIUM 8.5*  AST 17  ALT 16  ALKPHOS 70  BILITOT 0.5   ------------------------------------------------------------------------------------------------------------------ estimated creatinine clearance is 21 mL/min (A) (by C-G formula based on SCr of 1.89 mg/dL (H)). ------------------------------------------------------------------------------------------------------------------ No results for input(s): TSH, T4TOTAL, T3FREE, THYROIDAB in the last 72 hours.  Invalid input(s): FREET3   Coagulation profile Recent Labs  Lab 01/03/19 0630  INR 1.1   ------------------------------------------------------------------------------------------------------------------- No results for input(s): DDIMER in the last 72 hours. -------------------------------------------------------------------------------------------------------------------  Cardiac Enzymes No results for input(s): CKMB, TROPONINI, MYOGLOBIN in the last 168 hours.  Invalid input(s):  CK ------------------------------------------------------------------------------------------------------------------ Invalid input(s): POCBNP  ---------------------------------------------------------------------------------------------------------------  Urinalysis    Component Value Date/Time   COLORURINE YELLOW (A) 07/24/2015 2034   APPEARANCEUR CLEAR (A) 07/24/2015 2034   LABSPEC 1.011 07/24/2015 2034   PHURINE 6.0 07/24/2015 2034   GLUCOSEU NEGATIVE 07/24/2015 2034   HGBUR NEGATIVE 07/24/2015 2034   [place] NEGATIVE 07/24/2015 2034   KETONESUR NEGATIVE 07/24/2015 2034   PROTEINUR >500 (A) 07/24/2015 2034   NITRITE NEGATIVE 07/24/2015 2034   LEUKOCYTESUR NEGATIVE 07/24/2015 2034     RADIOLOGY: Dg Knee 2 Views Right  Result Date: 01/03/2019 CLINICAL DATA:  73 year old female status post fall at home landing on right side with pain. EXAM: RIGHT KNEE - 1-2 VIEW COMPARISON:  Right hip series today. FINDINGS: Sequelae of saphenous vein harvest suspected. Calcified peripheral vascular disease superimposed. No joint effusion. Normal alignment at the right knee. No acute osseous abnormality identified. IMPRESSION: No acute fracture or dislocation identified about the right knee. Electronically Signed   By: Genevie Ann M.D.   On: 01/03/2019 06:04   Ct Hip Right Wo Contrast  Result Date: 01/03/2019 CLINICAL DATA:  73 year old female status post fall onto right side with hip pain highly suspicious for hip fracture, underlying hip arthroplasty since 2016. Suspicion of subtle nondisplaced fracture on earlier radiographs. EXAM: CT OF THE RIGHT HIP WITHOUT CONTRAST TECHNIQUE: Multidetector CT imaging of the right hip was performed according to the standard protocol. Multiplanar CT image reconstructions were also generated. COMPARISON:  0536 hours today. FINDINGS: Osteopenia with minimally displaced fracture through the anterior proximal femur at the junction of the greater trochanter and shaft.  See series 2, images 49 through 60 and sagittal series 5, image 29. No hardware failure is identified. The acetabular component is normally located. The visible right hemipelvis is osteopenic but appears intact. Right inguinal and medial thigh surgical clips. Calcified femoral artery atherosclerosis. Vascular patency is not evaluated in the absence of IV contrast. Negative visible bowel and urinary bladder in the pelvis. IMPRESSION: Confirmed minimally displaced fracture through the anterior proximal femur at the junction of the greater trochanter and shaft. See series 2, images 49-60 and sagittal series 5, images 29 through 31. Electronically Signed   By: Genevie Ann M.D.   On: 01/03/2019 07:11   Dg Chest Port 1 View  Result Date: 01/03/2019 CLINICAL DATA:  73 year old female preoperative study for suspected proximal right femur fracture. EXAM: PORTABLE CHEST 1 VIEW COMPARISON:  07/27/2015 portable chest and earlier. FINDINGS: AP seated view at 0642 hours. Stable lung volumes at the upper limits of normal to mildly hyperinflated. No pneumothorax, pulmonary edema, pleural effusion or confluent pulmonary opacity. Postoperative changes to the mediastinum and chest wall. Cardiac size and mediastinal contours are within normal limits. Paucity bowel gas in the upper abdomen. Osteopenia. No acute osseous abnormality identified. IMPRESSION: No acute cardiopulmonary abnormality. Electronically Signed   By: Lemmie Evens  [place] Crane M.D.   On: 01/03/2019 07:13   Dg Hip Unilat W Or Wo Pelvis 2-3 Views Right  Addendum Date: 01/03/2019   ADDENDUM REPORT: 01/03/2019 06:25 ADDENDUM: Study discussed by telephone with Dr. Hinda Kehr on 01/03/2019 at 0612 hours and he advises the patient has convincing clinical symptoms of right hip fracture. On review of the images there is subtle lucency and cortical irregularity identified where the posterior greater trochanter joins the proximal right femoral shaft (arrows on images 1 and 3), new since the  postoperative radiograph ON 07/25/2015. CONCLUSION: Positive for nondisplaced proximal right femur fracture at the junction of the trochanter and proximal shaft about the femoral implant. This was discussed with Dr. Karma Greaser. Electronically Signed   By: Genevie Ann M.D.   On: 01/03/2019 06:25   Result Date: 01/03/2019 CLINICAL DATA:  73 year old female status post fall at home landing on right side with pain. EXAM: DG HIP (WITH OR WITHOUT PELVIS) 2-3V RIGHT COMPARISON:  Right hip and pelvis series 07/24/2015, pelvis 07/25/2015. FINDINGS: Right hip arthroplasty redemonstrated. Hardware components appear intact and normally aligned with the right acetabulum. Osteopenia. The pelvis appears stable and intact. Right inguinal surgical clips and bilateral calcified iliofemoral atherosclerosis again noted. Grossly intact proximal left femur. Visible right femur appears intact. No acute osseous abnormality identified. Negative lower abdominal and pelvic visceral contours. IMPRESSION: 1. No acute fracture or dislocation identified about the right hip or pelvis. 2. Previous right hip arthroplasty. 3. Calcified atherosclerosis. Electronically Signed: By: Genevie Ann M.D. On: 01/03/2019 06:03    EKG: Orders placed or performed during the hospital encounter of 01/03/19  . ED EKG  . ED EKG  . EKG 12-Lead  . EKG 12-Lead    IMPRESSION AND PLAN: *Acute right hip fracture Admit to regular nursing for bed, orthopedic surgery to see, n.p.o. for now, adult pain protocol  *Chronic diabetes mellitus type 2 Hold glipizide, sliding scale insulin with Accu-Cheks per routine  *COPD without exacerbation Breathing treatments PRN  *Chronic hypertension Coreg, clonidine, hydralazine  *Chronic GERD PPI daily  All the records are reviewed and case discussed with ED provider. Management plans discussed with the patient, family and they are in agreement.  CODE STATUS:full Code Status History    Date Active Date Inactive Code  Status Order ID Comments User Context   07/25/2015 1758 07/29/2015 1930 Full Code [id number]  Corky Mull, MD Inpatient   07/25/2015 0031 07/25/2015 1758 Full Code [id number]  Harrie Foreman, MD Inpatient   07/24/2015 2315 07/25/2015 0031 Full Code [id number]  Hessie Knows, MD ED   07/10/2015 1904 07/13/2015 1650 Full Code [id number]  Corky Mull, MD Inpatient   07/10/2015 0210 07/10/2015 1904 Full Code [id number]  Lance Coon, MD ED   07/10/2015 0021 07/10/2015 0210 Full Code [id number]  Hooten, Laurice Record, MD ED       TOTAL TIME TAKING CARE OF THIS PATIENT: 40 minutes.    Avel Peace Theresa Dohrman M.D on 01/03/2019   Between 7am to 6pm - Pager - [phone]  After 6pm go to [email or web address] - password [email or web address] [facility]  Office  [phone]  CC: Primary care physician; Alessandra Grout, MD   Note: This dictation was prepared with Dragon dictation along with smaller phrase technology. Any transcriptional errors that result from this process are unintentional.

## 2019-01-03 NOTE — Progress Notes (Signed)
Per MD Posey Pronto pt can resume diet. Carb Mod placed.

## 2019-01-03 NOTE — ED Provider Notes (Addendum)
[facility] Emergency Department Provider Note  ____________________________________________   First MD Initiated Contact with Patient 01/03/19 [phone]     (approximate)  I have reviewed the triage vital signs and the nursing notes.   HISTORY  Chief Complaint Fall and Leg Pain    HPI Anita Wilson is a 73 y.o. female with medical history as listed below which notably includes a prior right femoral neck fracture with hardware.  She presents by EMS for evaluation of acute onset severe pain in her right upper leg after a fall.  She says that she was asleep on the couch and got up to go to the bathroom.  She felt like her foot was asleep and she tried to shake it out a little bit but then when she took a step her leg collapsed and she fell on the right side.  She reports that she had acute onset severe pain.  She was able to walk on it a little bit initially with the help of a walker which she still had at her house from her prior surgery, but the pain is gotten worse and now she cannot bear any weight.  She has no numbness nor tingling.  She is able to wiggle her toes but has pain in the upper part of her leg when she tries to raise her lower her leg.  She did not strike her head and did not lose consciousness.  She has no headache, sore throat, neck pain, cough, shortness of breath, nausea, vomiting, chest pain, nor abdominal pain.  She reports that she does not take any blood thinners.         Past Medical History:  Diagnosis Date   A-fib [facility])    Allergy    Breast cancer ([place])    CAD (coronary artery disease)    Chronic diastolic heart failure (HCC)    CKD (chronic kidney disease), stage III (HCC)    COPD (chronic obstructive pulmonary disease) ([place])    Depression    Diabetes mellitus without complication ([place])    Hypertension    Pulmonary HTN ([place])     Patient Active Problem List   Diagnosis Date Noted   Hip subluxation ([place])  07/24/2015   Fracture of femoral neck, right ([place]) 07/10/2015   Type 2 diabetes mellitus ([place]) 07/10/2015   GERD (gastroesophageal reflux disease) 07/10/2015   COPD (chronic obstructive pulmonary disease) ([place]) 07/10/2015   CAD (coronary artery disease) 07/10/2015   HTN (hypertension) 07/10/2015   Chronic diastolic CHF (congestive heart failure) ([place]) 07/10/2015   CKD (chronic kidney disease), stage III ([place]) 07/10/2015   A-fib ([place]) 07/10/2015    Past Surgical History:  Procedure Laterality Date   APPENDECTOMY     BREAST LUMPECTOMY WITH AXILLARY LYMPH NODE DISSECTION     CORONARY ARTERY BYPASS GRAFT     HIP ARTHROPLASTY Right 07/25/2015   Procedure: ARTHROPLASTY BIPOLAR HIP (HEMIARTHROPLASTY);  Surgeon: Corky Mull, MD;  Location: [facility] ORS;  Service: Orthopedics;  Laterality: Right;   HIP PINNING,CANNULATED Right 07/10/2015   Procedure: CANNULATED HIP PINNING;  Surgeon: Corky Mull, MD;  Location: [facility] ORS;  Service: Orthopedics;  Laterality: Right;  right   MASTECTOMY     PARATHYROIDECTOMY      Prior to Admission medications   Medication Sig Start Date End Date Taking? Authorizing Provider  acetaminophen (TYLENOL) 325 MG tablet Take 650 mg by mouth every 4 (four) hours as needed for mild pain.    [provider]  alendronate (FOSAMAX) 70 MG tablet Take 70 mg by mouth once a week. Pt takes on Monday. Take with a full glass of water on an empty stomach.    [provider]  aspirin EC 81 MG tablet Take 81 mg by mouth daily.    [provider]  atorvastatin (LIPITOR) 40 MG tablet Take 40 mg by mouth daily.    [provider]  bisacodyl (DULCOLAX) 10 MG suppository Place 1 suppository (10 mg total) rectally daily as needed for moderate constipation. 07/13/15   Aldean Jewett, MD  carvedilol (COREG) 25 MG tablet Take 25 mg by mouth 2 (two) times daily.    [provider]  cholecalciferol (VITAMIN D) 1000 UNITS tablet Take  1,000 Units by mouth daily.    [provider]  citalopram (CELEXA) 10 MG tablet Take 10 mg by mouth daily.    [provider]  diltiazem (CARDIZEM) 60 MG tablet Take 1 tablet (60 mg total) by mouth every 6 (six) hours. 07/29/15   Demetrios Loll, MD  EPINEPHrine (EPIPEN JR 2-PAK) 0.15 MG/0.3ML injection Inject 0.15 mg into the muscle as needed for anaphylaxis.    [provider]  ferrous sulfate 325 (65 FE) MG tablet Take 1 tablet (325 mg total) by mouth 3 (three) times daily after meals. 07/12/15   Aldean Jewett, MD  fluticasone (FLONASE) 50 MCG/ACT nasal spray Place 2 sprays into both nostrils daily as needed for rhinitis.    [provider]  glipiZIDE (GLUCOTROL) 10 MG tablet Take 10 mg by mouth 2 (two) times daily.    [provider]  hydrALAZINE (APRESOLINE) 25 MG tablet Take 25 mg by mouth 2 (two) times daily.    [provider]  insulin aspart (NOVOLOG) 100 UNIT/ML injection Inject 3-13 Units into the skin 3 (three) times daily. Pt uses per sliding scale:    60-150:  0 units  151-200:  3 units  201-250:  5 units  251-300:  7 units  301-350:  9 units  351-400:  11 units  401-451:  13 units    [provider]  insulin glargine (LANTUS) 100 UNIT/ML injection Inject 0.1 mLs (10 Units total) into the skin at bedtime. Patient taking differently: Inject 12 Units into the skin at bedtime.  07/13/15   Aldean Jewett, MD  isosorbide mononitrate (IMDUR) 30 MG 24 hr tablet Take 30 mg by mouth daily.    [provider]  letrozole (FEMARA) 2.5 MG tablet Take 2.5 mg by mouth daily.    [provider]  metaxalone (SKELAXIN) 400 MG tablet Take 400 mg by mouth every 4 (four) hours as needed for muscle spasms.    [provider]  Multiple Vitamin (MULTIVITAMIN WITH MINERALS) TABS tablet Take 1 tablet by mouth daily.    [provider]  omeprazole (PRILOSEC) 20 MG capsule Take 20 mg by mouth 2 (two) times  daily.    [provider]  oxyCODONE (OXY IR/ROXICODONE) 5 MG immediate release tablet Take 1-2 tablets (5-10 mg total) by mouth every 4 (four) hours as needed for breakthrough pain. 07/27/15   Reche Dixon, PA-C  tiotropium (SPIRIVA) 18 MCG inhalation capsule Place 18 mcg into inhaler and inhale daily.    [provider]    Allergies Bee venom; Diphenhydramine; Morphine; Phenylalanine; and Sulfa antibiotics  Family History  Problem Relation Age of Onset   Hypertension Mother    Diabetes Mother    Heart disease Father    Diabetes  Father    Diabetes Sister    Breast cancer Sister    Dementia Sister     Social History Social History   Tobacco Use   Smoking status: Current Some Day Smoker    Types: Cigarettes   Smokeless tobacco: Never Used  Substance Use Topics   Alcohol use: Yes    Comment: Occassionally   Drug use: No    Review of Systems Constitutional: No fever/chills Eyes: No visual changes. ENT: No sore throat. Cardiovascular: Denies chest pain. Respiratory: Denies shortness of breath. Gastrointestinal: No abdominal pain.  No nausea, no vomiting.  No diarrhea.  No constipation. Genitourinary: Negative for dysuria. Musculoskeletal: Pain in right upper leg.  Negative for neck pain.  Negative for back pain. Integumentary: Negative for rash. Neurological: Negative for headaches, focal weakness or numbness.   ____________________________________________   PHYSICAL EXAM:  VITAL SIGNS: ED Triage Vitals  Enc Vitals Group     BP 01/03/19 0433 (!) 170/71     Pulse Rate 01/03/19 0433 83     Resp 01/03/19 0433 16     Temp 01/03/19 0433 98.2 F (36.8 C)     Temp Source 01/03/19 0433 Oral     SpO2 01/03/19 0433 97 %     Weight 01/03/19 0435 49.4 kg (109 lb)     Height 01/03/19 0435 1.626 m (5\' 4" )     Head Circumference --      Peak Flow --      Pain Score 01/03/19 0434 9     Pain Loc --      Pain Edu? --      Excl. in [place]? --      Constitutional: Alert and oriented.  Generally well-appearing but does appear uncomfortable. Eyes: Conjunctivae are normal.  Head: Atraumatic. Nose: No congestion/rhinnorhea. Mouth/Throat: Mucous membranes are moist. Neck: No stridor.  No meningeal signs.   Cardiovascular: Normal rate, regular rhythm. Good peripheral circulation. Grossly normal heart sounds. Respiratory: Normal respiratory effort.  No retractions. No audible wheezing. Gastrointestinal: Soft and nontender. No distention.  Musculoskeletal: The patient is holding her right leg in a flexed position.  She reports severe pain in the back upper part of her leg consistent with the femoral neck when I try to passively raise or abduct the leg.  There is no other obvious gross deformity and no bruising, abrasion, nor laceration.  She is neurovascularly intact. Neurologic:  Normal speech and language. No gross focal neurologic deficits are appreciated.  Skin:  Skin is warm, dry and intact. No rash noted. Psychiatric: Mood and affect are normal. Speech and behavior are normal.  ____________________________________________   LABS (all labs ordered are listed, but only abnormal results are displayed)  Labs Reviewed  CBC WITH DIFFERENTIAL/PLATELET - Abnormal; Notable for the following components:      Result Value   RBC 3.23 (*)    Hemoglobin 10.2 (*)    HCT 30.9 (*)    Platelets 93 (*)    Lymphs Abs 0.6 (*)    All other components within normal limits  APTT - Abnormal; Notable for the following components:   aPTT 39 (*)    All other components within normal limits  SARS CORONAVIRUS 2 (HOSPITAL ORDER, [place] [facility])  PROTIME-INR  COMPREHENSIVE METABOLIC PANEL  TYPE AND SCREEN  TYPE AND SCREEN   ____________________________________________  EKG  ED ECG REPORT I, Hinda Kehr, the attending physician, personally viewed and interpreted this ECG.  Date: 01/03/2019 EKG Time: 6:57 AM  Rate:  87 Rhythm: normal sinus rhythm QRS Axis: normal Intervals: Right bundle branch block ST/T Wave abnormalities: Non-specific ST segment / T-wave changes, but no clear evidence of acute ischemia. Narrative Interpretation: no definitive evidence of acute ischemia; does not meet STEMI criteria.   ____________________________________________  RADIOLOGY I, Hinda Kehr, personally discussed these images and results by phone with the on-call radiologist and used this discussion as part of my medical decision making.    ED MD interpretation: Nondisplaced proximal femur fracture around the greater trochanter at the site of prior hardware.  Official radiology report(s): Dg Knee 2 Views Right  Result Date: 01/03/2019 CLINICAL DATA:  73 year old female status post fall at home landing on right side with pain. EXAM: RIGHT KNEE - 1-2 VIEW COMPARISON:  Right hip series today. FINDINGS: Sequelae of saphenous vein harvest suspected. Calcified peripheral vascular disease superimposed. No joint effusion. Normal alignment at the right knee. No acute osseous abnormality identified. IMPRESSION: No acute fracture or dislocation identified about the right knee. Electronically Signed   By: Genevie Ann M.D.   On: 01/03/2019 06:04   Ct Hip Right Wo Contrast  Result Date: 01/03/2019 CLINICAL DATA:  73 year old female status post fall onto right side with hip pain highly suspicious for hip fracture, underlying hip arthroplasty since 2016. Suspicion of subtle nondisplaced fracture on earlier radiographs. EXAM: CT OF THE RIGHT HIP WITHOUT CONTRAST TECHNIQUE: Multidetector CT imaging of the right hip was performed according to the standard protocol. Multiplanar CT image reconstructions were also generated. COMPARISON:  0536 hours today. FINDINGS: Osteopenia with minimally displaced fracture through the anterior proximal femur at the junction of the greater trochanter and shaft. See series 2, images 49 through 60 and sagittal  series 5, image 29. No hardware failure is identified. The acetabular component is normally located. The visible right hemipelvis is osteopenic but appears intact. Right inguinal and medial thigh surgical clips. Calcified femoral artery atherosclerosis. Vascular patency is not evaluated in the absence of IV contrast. Negative visible bowel and urinary bladder in the pelvis. IMPRESSION: Confirmed minimally displaced fracture through the anterior proximal femur at the junction of the greater trochanter and shaft. See series 2, images 49-60 and sagittal series 5, images 29 through 31. Electronically Signed   By: Genevie Ann M.D.   On: 01/03/2019 07:11   Dg Chest Port 1 View  Result Date: 01/03/2019 CLINICAL DATA:  73 year old female preoperative study for suspected proximal right femur fracture. EXAM: PORTABLE CHEST 1 VIEW COMPARISON:  07/27/2015 portable chest and earlier. FINDINGS: AP seated view at 0642 hours. Stable lung volumes at the upper limits of normal to mildly hyperinflated. No pneumothorax, pulmonary edema, pleural effusion or confluent pulmonary opacity. Postoperative changes to the mediastinum and chest wall. Cardiac size and mediastinal contours are within normal limits. Paucity bowel gas in the upper abdomen. Osteopenia. No acute osseous abnormality identified. IMPRESSION: No acute cardiopulmonary abnormality. Electronically Signed   By: Genevie Ann M.D.   On: 01/03/2019 07:13   Dg Hip Unilat W Or Wo Pelvis 2-3 Views Right  Addendum Date: 01/03/2019   ADDENDUM REPORT: 01/03/2019 06:25 ADDENDUM: Study discussed by telephone with Dr. Hinda Kehr on 01/03/2019 at 0612 hours and he advises the patient has convincing clinical symptoms of right hip fracture. On review of the images there is subtle lucency and cortical irregularity identified where the posterior greater trochanter joins the proximal right femoral shaft (arrows on images 1 and 3), new since the postoperative radiograph ON 07/25/2015.  CONCLUSION: Positive for  nondisplaced proximal right femur fracture at the junction of the trochanter and proximal shaft about the femoral implant. This was discussed with Dr. Karma Greaser. Electronically Signed   By: Genevie Ann M.D.   On: 01/03/2019 06:25   Result Date: 01/03/2019 CLINICAL DATA:  73 year old female status post fall at home landing on right side with pain. EXAM: DG HIP (WITH OR WITHOUT PELVIS) 2-3V RIGHT COMPARISON:  Right hip and pelvis series 07/24/2015, pelvis 07/25/2015. FINDINGS: Right hip arthroplasty redemonstrated. Hardware components appear intact and normally aligned with the right acetabulum. Osteopenia. The pelvis appears stable and intact. Right inguinal surgical clips and bilateral calcified iliofemoral atherosclerosis again noted. Grossly intact proximal left femur. Visible right femur appears intact. No acute osseous abnormality identified. Negative lower abdominal and pelvic visceral contours. IMPRESSION: 1. No acute fracture or dislocation identified about the right hip or pelvis. 2. Previous right hip arthroplasty. 3. Calcified atherosclerosis. Electronically Signed: By: Genevie Ann M.D. On: 01/03/2019 06:03    ____________________________________________   PROCEDURES   Procedure(s) performed (including Critical Care):  Procedures   ____________________________________________   INITIAL IMPRESSION / MDM / [place] / ED COURSE  As part of my medical decision making, I reviewed the following data within the [place] notes reviewed and incorporated, Labs reviewed , Old chart reviewed, Patient signed out to , Radiograph reviewed , Discussed with PCP , Discussed with radiologist and Notes from prior ED visits and EKG reviewed.      *Anita Wilson was evaluated in Emergency Department on 01/03/2019 for the symptoms described in the history of present illness. She was evaluated in the context of the global COVID-19 pandemic, which  necessitated consideration that the patient might be at risk for infection with the SARS-CoV-2 virus that causes COVID-19. Institutional protocols and algorithms that pertain to the evaluation of patients at risk for COVID-19 are in a state of rapid change based on information released by regulatory bodies including the CDC and federal and state organizations. These policies and algorithms were followed during the patient's care in the ED.*  Differential diagnosis includes, but is not limited to, femur fracture, hardware dislocation, less likely more distal injury of the leg such as a tibial plateau fracture.  She did not pass out and I have no concerns about COVID-19 or other acute infection.  Although she reports pain all throughout her leg, physical exam reveals that the pain is in the proximal area of the femur and I suspect that she has refractured her femur.  I will obtain radiographs of both the right hip and the right knee to try to rule out any acute injury but I strongly suspect bony injury based on the physical exam.  Vital signs are generally reassuring except for some hypertension.  No tachycardia no fever is noted and the patient is not having any respiratory symptoms or complaints.   (Note that documentation was delayed due to multiple ED patients requiring immediate care.)  Radiographs were interpreted as negative but I will call and discuss the case with Dr. Nevada Crane based on the physical exam and clinical findings.   Clinical Course as of Jan 03 731  Tue Jan 03, 2019  0611 Discussed case by phone with Dr. Nevada Crane (radiology). Upon reassessment, he is concerned about a possible fracture (a cortical flap along the posterior distal trochanter) that he did not notice before.  He feels that it does represent an acute fracture.  He is going to update his report.  He also recommended that I contact orthopedics to ask if they would like additional imaging, either CT or MRI.   [CF]  5188 I discussed  the case by phone with Dr. Leim Fabry.  He recommended a CT of the right hip without contrast.  He also asked that he be contacted again when the results are back.  I informed the patient of the plan and we are going to go ahead and obtain labs and work-up as it for a hip fracture.  She is adamant about no morphine so I am going to give her a small dose of fentanyl (25 mcg IV).   [CF]  [phone] The patient just came back from CT.  Lab work is pending including coronavirus swab.  I will transfer ED care to Dr. Kerman Passey to follow-up with Dr. Posey Pronto by phone and to admit the patient to the hospitalist service once labs and imaging are back.   [CF]  [phone] Discussed case by phone with Dr. Brett Albino who will admit (the CT confirmed the fracture).  Informed Dr. Posey Pronto by [facility] secure message.   [CF]    Clinical Course User Index [CF] Hinda Kehr, MD     ____________________________________________  FINAL CLINICAL IMPRESSION(S) / ED DIAGNOSES  Final diagnoses:  Closed fracture of proximal end of right femur, initial encounter ([place])     MEDICATIONS GIVEN DURING THIS VISIT:  Medications  fentaNYL (SUBLIMAZE) injection 25 mcg (has no administration in time range)  ondansetron (ZOFRAN) injection 4 mg (4 mg Intravenous Refused 01/03/19 0715)     ED Discharge Orders    None       Note:  This document was prepared using Dragon voice recognition software and may include unintentional dictation errors.   Hinda Kehr, MD 01/03/19 1601    Hinda Kehr, MD 01/03/19 [phone]

## 2019-01-04 LAB — CBC
HCT: 27.8 % — ABNORMAL LOW (ref 36.0–46.0)
Hemoglobin: 8.9 g/dL — ABNORMAL LOW (ref 12.0–15.0)
MCH: 30.9 pg (ref 26.0–34.0)
MCHC: 32 g/dL (ref 30.0–36.0)
MCV: 96.5 fL (ref 80.0–100.0)
Platelets: 84 10*3/uL — ABNORMAL LOW (ref 150–400)
RBC: 2.88 MIL/uL — ABNORMAL LOW (ref 3.87–5.11)
RDW: 13.1 % (ref 11.5–15.5)
WBC: 4.5 10*3/uL (ref 4.0–10.5)
nRBC: 0 % (ref 0.0–0.2)

## 2019-01-04 LAB — BASIC METABOLIC PANEL
Anion gap: 7 (ref 5–15)
BUN: 50 mg/dL — ABNORMAL HIGH (ref 8–23)
CO2: 19 mmol/L — ABNORMAL LOW (ref 22–32)
Calcium: 8.1 mg/dL — ABNORMAL LOW (ref 8.9–10.3)
Chloride: 111 mmol/L (ref 98–111)
Creatinine, Ser: 1.8 mg/dL — ABNORMAL HIGH (ref 0.44–1.00)
GFR calc Af Amer: 32 mL/min — ABNORMAL LOW (ref >60)
GFR calc non Af Amer: 28 mL/min — ABNORMAL LOW (ref >60)
Glucose, Bld: 141 mg/dL — ABNORMAL HIGH (ref 70–99)
Potassium: 3.8 mmol/L (ref 3.5–5.1)
Sodium: 137 mmol/L (ref 135–145)

## 2019-01-04 LAB — GLUCOSE, CAPILLARY
Glucose-Capillary: 112 mg/dL — ABNORMAL HIGH (ref 70–99)
Glucose-Capillary: 142 mg/dL — ABNORMAL HIGH (ref 70–99)
Glucose-Capillary: 225 mg/dL — ABNORMAL HIGH (ref 70–99)
Glucose-Capillary: 138 mg/dL — ABNORMAL HIGH (ref 70–99)

## 2019-01-04 MED ORDER — SODIUM BICARBONATE 8.4 % IV SOLN
INTRAVENOUS | Status: DC
  Administered 2019-01-04 (×2): via INTRAVENOUS
  Filled 2019-01-04 (×5): qty 100

## 2019-01-04 MED ORDER — ADULT MULTIVITAMIN W/MINERALS CH
1.0000 | ORAL_TABLET | Freq: Every day | ORAL | Status: DC
  Administered 2019-01-05 – 2019-01-06 (×2): 1 via ORAL
  Filled 2019-01-04 (×2): qty 1

## 2019-01-04 MED ORDER — ENSURE MAX PROTEIN PO LIQD
11.0000 [oz_av] | Freq: Every day | ORAL | Status: DC
  Administered 2019-01-04: 11 [oz_av] via ORAL
  Filled 2019-01-04: qty 330

## 2019-01-04 MED ORDER — ASPIRIN EC 81 MG PO TBEC
81.0000 mg | DELAYED_RELEASE_TABLET | Freq: Every day | ORAL | Status: DC
  Administered 2019-01-04 – 2019-01-06 (×3): 81 mg via ORAL
  Filled 2019-01-04 (×3): qty 1

## 2019-01-04 MED ORDER — SODIUM BICARBONATE 8.4 % IV SOLN
100.0000 meq | Freq: Once | INTRAVENOUS | Status: DC
  Filled 2019-01-04: qty 50

## 2019-01-04 MED ORDER — ENOXAPARIN SODIUM 30 MG/0.3ML ~~LOC~~ SOLN
30.0000 mg | SUBCUTANEOUS | Status: DC
  Administered 2019-01-04 – 2019-01-06 (×2): 30 mg via SUBCUTANEOUS
  Filled 2019-01-04 (×2): qty 0.3

## 2019-01-04 MED ORDER — GLIPIZIDE 5 MG PO TABS
5.0000 mg | ORAL_TABLET | Freq: Every day | ORAL | Status: DC
  Administered 2019-01-05: 08:00:00 5 mg via ORAL
  Filled 2019-01-04: qty 1

## 2019-01-04 NOTE — Progress Notes (Signed)
Initial Nutrition Assessment  RD working remotely.  DOCUMENTATION CODES:   Underweight  INTERVENTION:  Provide Ensure Max Protein po daily, each supplement provides 150 kcal and 30 grams of protein.  Provide daily MVI.  NUTRITION DIAGNOSIS:   Increased nutrient needs related to (healing of right periprosthetic hip fracture, COPD, CKD) as evidenced by estimated needs.  GOAL:   Patient will meet greater than or equal to 90% of their needs  MONITOR:   PO intake, Supplement acceptance, Labs, Weight trends, I & O's  REASON FOR ASSESSMENT:   Consult Assessment of nutrition requirement/status  ASSESSMENT:   73 year old female with PMHx of HTN, DM, allergies, depression, COPD, A-fib, CAD, chronic diastolic heart failure, CKD stage III, hx breast cancer s/p mastectomy admitted with acute right periprosthetic hip fracture with plans for conservative medical management.   Spoke with patient over the phone. She reports her appetite is "fine" and unchanged from baseline. She reports she typically eats two meals plus a snack per day. For breakfast she may have eggs, grits, or oatmeal. Dinner is usually a meat with vegetables. Here she reports she is eating how she normally does. She ate 90% of breakfast this morning and 40% of lunch today. She is amenable to drinking ONS to help meet protein needs. She wants an ONS that is low in sugar.  Patient reports her UBW was 117 lbs. She is now 45.8 kg (101 lbs). She has lost 13.7% body weight over several years, which is not significant for time frame.  Medications reviewed and include: Colace 100 mg BID, ferrous sulfate 325 mg daily, glipizide 5 mg daily starting tomorrow, Novolog 0-15 units TID, Novolog 0-5 units QHS, Lantus 10 units QHS, pantoprazole, sodium bicarbonate 100 EQ in D5 at 100 mL/hr.  Labs reviewed: CBG 91-142, CO2 19, BUN 50, Creatinine 1.8.  Suspect patient is malnourished, but unable to confirm without completing  NFPE.  NUTRITION - FOCUSED PHYSICAL EXAM:  Unable to complete at this time.  Diet Order:   Diet Order            Diet Carb Modified Fluid consistency: Thin; Room service appropriate? Yes  Diet effective now             EDUCATION NEEDS:   No education needs have been identified at this time  Skin:  Skin Assessment: Reviewed RN Assessment(ecchymosis)  Last BM:  01/04/2019 - smear type 6  Height:   Ht Readings from Last 1 Encounters:  01/03/19 5\' 4"  (1.626 m)   Weight:   Wt Readings from Last 1 Encounters:  01/04/19 45.8 kg   Ideal Body Weight:  54.5 kg  BMI:  Body mass index is 17.34 kg/m.  Estimated Nutritional Needs:   Kcal:  1400-1600  Protein:  70-80 grams  Fluid:  1.4-1.6 L/day  Willey Blade, MS, RD, LDN Office: [phone] Pager: [phone] After Hours/Weekend Pager: [phone]

## 2019-01-04 NOTE — Progress Notes (Signed)
Pt expresses frustration about not being updated and unaware of plan of care.  Pt states "I am frustrated and no physician has spoken to me about my plan." Would like to see MD to address plan of care.

## 2019-01-04 NOTE — Progress Notes (Signed)
[place] [facility] [place] NAME: Anita Wilson    MR#:  [id number]  DATE OF BIRTH:  [birth year]  SUBJECTIVE:  Patient complaining of hip pain only, orthopedic surgery input greatly appreciated-trial of conservative medical management recommended going forward, PT/OT to evaluate/treat  REVIEW OF SYSTEMS:  CONSTITUTIONAL: No fever, fatigue or weakness.  EYES: No blurred or double vision.  EARS, NOSE, AND THROAT: No tinnitus or ear pain.  RESPIRATORY: No cough, shortness of breath, wheezing or hemoptysis.  CARDIOVASCULAR: No chest pain, orthopnea, edema.  GASTROINTESTINAL: No nausea, vomiting, diarrhea or abdominal pain.  GENITOURINARY: No dysuria, hematuria.  ENDOCRINE: No polyuria, nocturia,  HEMATOLOGY: No anemia, easy bruising or bleeding SKIN: No rash or lesion. MUSCULOSKELETAL: No joint pain or arthritis.   NEUROLOGIC: No tingling, numbness, weakness.  PSYCHIATRY: No anxiety or depression.   ROS  DRUG ALLERGIES:   Allergies  Allergen Reactions  . Bee Venom Shortness Of Breath and Swelling  . Benzodiazepines     Other reaction(s): Confusion  . Diphenhydramine Other (See Comments)    Took tylenol pm and end up in the hospital something wrong with my heart  . Morphine Other (See Comments)    Reaction:  Hallucinations   . Phenylalanine Diarrhea and Nausea And Vomiting  . Sulfa Antibiotics Other (See Comments)    metallic smell and taste    VITALS:  Blood pressure (!) 141/53, pulse 68, temperature 98.2 F (36.8 C), temperature source Oral, resp. rate 15, height 5\' 4"  (1.626 m), weight 45.8 kg, SpO2 95 %.  PHYSICAL EXAMINATION:  GENERAL:  73 y.o.-year-old patient lying in the bed with no acute distress.  EYES: Pupils equal, round, reactive to light and accommodation. No scleral icterus. Extraocular muscles intact.  HEENT: Head atraumatic, normocephalic. Oropharynx and nasopharynx clear.  NECK:  Supple, no jugular venous  distention. No thyroid enlargement, no tenderness.  LUNGS: Normal breath sounds bilaterally, no wheezing, rales,rhonchi or crepitation. No use of accessory muscles of respiration.  CARDIOVASCULAR: S1, S2 normal. No murmurs, rubs, or gallops.  ABDOMEN: Soft, nontender, nondistended. Bowel sounds present. No organomegaly or mass.  EXTREMITIES: No pedal edema, cyanosis, or clubbing.  NEUROLOGIC: Cranial nerves II through XII are intact. Muscle strength 5/5 in all extremities. Sensation intact. Gait not checked.  PSYCHIATRIC: The patient is alert and oriented x 3.  SKIN: No obvious rash, lesion, or ulcer.   Physical Exam LABORATORY PANEL:   CBC Recent Labs  Lab 01/04/19 0415  WBC 4.5  HGB 8.9*  HCT 27.8*  PLT 84*   ------------------------------------------------------------------------------------------------------------------  Chemistries  Recent Labs  Lab 01/03/19 0630 01/04/19 0415  NA 141 137  K 4.6 3.8  CL 113* 111  CO2 19* 19*  GLUCOSE 142* 141*  BUN 52* 50*  CREATININE 1.89* 1.80*  CALCIUM 8.5* 8.1*  AST 17  --   ALT 16  --   ALKPHOS 70  --   BILITOT 0.5  --    ------------------------------------------------------------------------------------------------------------------  Cardiac Enzymes No results for input(s): TROPONINI in the last 168 hours. ------------------------------------------------------------------------------------------------------------------  RADIOLOGY:  Dg Knee 2 Views Right  Result Date: 01/03/2019 CLINICAL DATA:  73 year old female status post fall at home landing on right side with pain. EXAM: RIGHT KNEE - 1-2 VIEW COMPARISON:  Right hip series today. FINDINGS: Sequelae of saphenous vein harvest suspected. Calcified peripheral vascular disease superimposed. No joint effusion. Normal alignment at the right knee. No acute osseous abnormality identified. IMPRESSION: No acute fracture or dislocation identified about the right  knee.  Electronically Signed   By: Genevie Ann M.D.   On: 01/03/2019 06:04   Ct Hip Right Wo Contrast  Result Date: 01/03/2019 CLINICAL DATA:  73 year old female status post fall onto right side with hip pain highly suspicious for hip fracture, underlying hip arthroplasty since 2016. Suspicion of subtle nondisplaced fracture on earlier radiographs. EXAM: CT OF THE RIGHT HIP WITHOUT CONTRAST TECHNIQUE: Multidetector CT imaging of the right hip was performed according to the standard protocol. Multiplanar CT image reconstructions were also generated. COMPARISON:  0536 hours today. FINDINGS: Osteopenia with minimally displaced fracture through the anterior proximal femur at the junction of the greater trochanter and shaft. See series 2, images 49 through 60 and sagittal series 5, image 29. No hardware failure is identified. The acetabular component is normally located. The visible right hemipelvis is osteopenic but appears intact. Right inguinal and medial thigh surgical clips. Calcified femoral artery atherosclerosis. Vascular patency is not evaluated in the absence of IV contrast. Negative visible bowel and urinary bladder in the pelvis. IMPRESSION: Confirmed minimally displaced fracture through the anterior proximal femur at the junction of the greater trochanter and shaft. See series 2, images 49-60 and sagittal series 5, images 29 through 31. Electronically Signed   By: Genevie Ann M.D.   On: 01/03/2019 07:11   Dg Chest Port 1 View  Result Date: 01/03/2019 CLINICAL DATA:  73 year old female preoperative study for suspected proximal right femur fracture. EXAM: PORTABLE CHEST 1 VIEW COMPARISON:  07/27/2015 portable chest and earlier. FINDINGS: AP seated view at 0642 hours. Stable lung volumes at the upper limits of normal to mildly hyperinflated. No pneumothorax, pulmonary edema, pleural effusion or confluent pulmonary opacity. Postoperative changes to the mediastinum and chest wall. Cardiac size and mediastinal contours  are within normal limits. Paucity bowel gas in the upper abdomen. Osteopenia. No acute osseous abnormality identified. IMPRESSION: No acute cardiopulmonary abnormality. Electronically Signed   By: Genevie Ann M.D.   On: 01/03/2019 07:13   Dg Hip Unilat W Or Wo Pelvis 2-3 Views Right  Addendum Date: 01/03/2019   ADDENDUM REPORT: 01/03/2019 06:25 ADDENDUM: Study discussed by telephone with Dr. Hinda Kehr on 01/03/2019 at 0612 hours and he advises the patient has convincing clinical symptoms of right hip fracture. On review of the images there is subtle lucency and cortical irregularity identified where the posterior greater trochanter joins the proximal right femoral shaft (arrows on images 1 and 3), new since the postoperative radiograph ON 07/25/2015. CONCLUSION: Positive for nondisplaced proximal right femur fracture at the junction of the trochanter and proximal shaft about the femoral implant. This was discussed with Dr. Karma Greaser. Electronically Signed   By: Genevie Ann M.D.   On: 01/03/2019 06:25   Result Date: 01/03/2019 CLINICAL DATA:  73 year old female status post fall at home landing on right side with pain. EXAM: DG HIP (WITH OR WITHOUT PELVIS) 2-3V RIGHT COMPARISON:  Right hip and pelvis series 07/24/2015, pelvis 07/25/2015. FINDINGS: Right hip arthroplasty redemonstrated. Hardware components appear intact and normally aligned with the right acetabulum. Osteopenia. The pelvis appears stable and intact. Right inguinal surgical clips and bilateral calcified iliofemoral atherosclerosis again noted. Grossly intact proximal left femur. Visible right femur appears intact. No acute osseous abnormality identified. Negative lower abdominal and pelvic visceral contours. IMPRESSION: 1. No acute fracture or dislocation identified about the right hip or pelvis. 2. Previous right hip arthroplasty. 3. Calcified atherosclerosis. Electronically Signed: By: Genevie Ann M.D. On: 01/03/2019 06:03    ASSESSMENT AND PLAN:   *  Acute right periprosthetic hip fracture Status post evaluation by Dr. Patel/orthopedic surgery-conservative medical management/nonoperative treatment recommended going forward, PT/OT to evaluate/treat, continue adult pain protocol, fall precautions   *Chronic diabetes mellitus type 2 Restart glipizide, continue sliding scale insulin with Accu-Cheks per routine  *COPD without exacerbation Breathing treatments PRN  *Chronic hypertension Coreg, clonidine, hydralazine  *Chronic GERD PPI daily  Disposition pending clinical course DVT prophylaxis with Lovenox subcu  All the records are reviewed and case discussed with Care Management/Social Workerr. Management plans discussed with the patient, family and they are in agreement.  CODE STATUS: full  TOTAL TIME TAKING CARE OF THIS PATIENT: 35 minutes.     POSSIBLE D/C IN 1-2 DAYS, DEPENDING ON CLINICAL CONDITION.   Avel Peace Robie Mcniel M.D on 01/04/2019   Between 7am to 6pm - Pager - [phone]  After 6pm go to [email or web address] - password [email or web address] [facility]  Office  [phone]  CC: Primary care physician; Alessandra Grout, MD  Note: This dictation was prepared with Dragon dictation along with smaller phrase technology. Any transcriptional errors that result from this process are unintentional.

## 2019-01-04 NOTE — Progress Notes (Signed)
PHARMACIST - PHYSICIAN COMMUNICATION  CONCERNING:  Enoxaparin (Lovenox) for DVT Prophylaxis    RECOMMENDATION: Patient was prescribed enoxaprin 40mg  q24 hours for VTE prophylaxis.   Filed Weights   01/03/19 0435 01/04/19 0500  Weight: 109 lb (49.4 kg) 101 lb (45.8 kg)    Body mass index is 17.34 kg/m.  Estimated Creatinine Clearance: 20.4 mL/min (A) (by C-G formula based on SCr of 1.8 mg/dL (H)).   Patient is candidate for enoxaparin 30mg  every 24 hours based on CrCl <6ml/min.   DESCRIPTION: Pharmacy has adjusted enoxaparin dose per [facility] policy.   Patient is now receiving enoxaparin 30mg  every 24 hours.  Rowland Lathe, PharmD Clinical Pharmacist  01/04/2019 10:58 AM

## 2019-01-04 NOTE — Plan of Care (Signed)

## 2019-01-05 LAB — CBC
HCT: 26.7 % — ABNORMAL LOW (ref 36.0–46.0)
Hemoglobin: 9 g/dL — ABNORMAL LOW (ref 12.0–15.0)
MCH: 31.4 pg (ref 26.0–34.0)
MCHC: 33.7 g/dL (ref 30.0–36.0)
MCV: 93 fL (ref 80.0–100.0)
Platelets: 78 10*3/uL — ABNORMAL LOW (ref 150–400)
RBC: 2.87 MIL/uL — ABNORMAL LOW (ref 3.87–5.11)
RDW: 12.9 % (ref 11.5–15.5)
WBC: 4.6 10*3/uL (ref 4.0–10.5)
nRBC: 0 % (ref 0.0–0.2)

## 2019-01-05 LAB — GLUCOSE, CAPILLARY
Glucose-Capillary: 231 mg/dL — ABNORMAL HIGH (ref 70–99)
Glucose-Capillary: 233 mg/dL — ABNORMAL HIGH (ref 70–99)
Glucose-Capillary: 168 mg/dL — ABNORMAL HIGH (ref 70–99)
Glucose-Capillary: 156 mg/dL — ABNORMAL HIGH (ref 70–99)
Glucose-Capillary: 171 mg/dL — ABNORMAL HIGH (ref 70–99)

## 2019-01-05 LAB — BASIC METABOLIC PANEL
Anion gap: 6 (ref 5–15)
BUN: 51 mg/dL — ABNORMAL HIGH (ref 8–23)
CO2: 26 mmol/L (ref 22–32)
Calcium: 8.1 mg/dL — ABNORMAL LOW (ref 8.9–10.3)
Chloride: 107 mmol/L (ref 98–111)
Creatinine, Ser: 1.53 mg/dL — ABNORMAL HIGH (ref 0.44–1.00)
GFR calc Af Amer: 39 mL/min — ABNORMAL LOW (ref >60)
GFR calc non Af Amer: 34 mL/min — ABNORMAL LOW (ref >60)
Glucose, Bld: 225 mg/dL — ABNORMAL HIGH (ref 70–99)
Potassium: 3.8 mmol/L (ref 3.5–5.1)
Sodium: 139 mmol/L (ref 135–145)

## 2019-01-05 MED ORDER — INSULIN GLARGINE 100 UNIT/ML ~~LOC~~ SOLN
12.0000 [IU] | Freq: Once | SUBCUTANEOUS | Status: AC
  Administered 2019-01-05: 12 [IU] via SUBCUTANEOUS
  Filled 2019-01-05: qty 0.12

## 2019-01-05 MED ORDER — GLIPIZIDE 10 MG PO TABS: 10.0000 mg | ORAL_TABLET | Freq: Every day | ORAL | Status: DC

## 2019-01-05 MED ORDER — FLUTICASONE PROPIONATE 50 MCG/ACT NA SUSP
1.0000 | Freq: Every day | NASAL | Status: DC
  Administered 2019-01-05 – 2019-01-06 (×2): 1 via NASAL
  Filled 2019-01-05: qty 16

## 2019-01-05 MED ORDER — INSULIN GLARGINE 100 UNIT/ML ~~LOC~~ SOLN
14.0000 [IU] | Freq: Every day | SUBCUTANEOUS | Status: DC
  Filled 2019-01-05 (×2): qty 0.14

## 2019-01-05 NOTE — TOC Progression Note (Signed)
Transition of Care [facility]) - Progression Note    Patient Details  Name: Anita Wilson MRN: [id number] Date of Birth: Nov 02, 1945  Transition of Care [facility]) CM/SW [place], RN Phone Number: 01/05/2019, 3:17 PM  Clinical Narrative:    Reviewed bed choices with the patient, she wants to go to [facility] as she has been there in the past, I called Leslies at [facility] to notify her of the patient's choice, left a secure detailed VM and requested a call back with the room number, accepted the bed offer in the hub as well and sent PT notes   Expected Discharge Plan: [place] Barriers to Discharge: Continued Medical Work up  Expected Discharge Plan and Services Expected Discharge Plan: [place]   Discharge Planning Services: CM Consult Post Acute Care Choice: [place] arrangements for the past 2 months: Single Family Home                                       Social Determinants of Health (SDOH) Interventions    Readmission Risk Interventions No flowsheet data found.

## 2019-01-05 NOTE — Progress Notes (Signed)
[place] at [place] NAME: Anita Wilson    MR#:  [id number]  DATE OF BIRTH:  1946/03/09  SUBJECTIVE:  Patient complaining of hip pain only, orthopedic surgery input greatly appreciated-trial of conservative medical management recommended going forward, PT/OT to evaluate/treat.  She has no new complaints.  No active bleed.  REVIEW OF SYSTEMS:  CONSTITUTIONAL: No fever, fatigue or weakness.  EYES: No blurred or double vision.  EARS, NOSE, AND THROAT: No tinnitus or ear pain.  RESPIRATORY: No cough, shortness of breath, wheezing or hemoptysis.  CARDIOVASCULAR: No chest pain, orthopnea, edema.  GASTROINTESTINAL: No nausea, vomiting, diarrhea or abdominal pain.  GENITOURINARY: No dysuria, hematuria.  ENDOCRINE: No polyuria, nocturia,  HEMATOLOGY: No anemia, easy bruising or bleeding SKIN: No rash or lesion. MUSCULOSKELETAL: No joint pain or arthritis.   NEUROLOGIC: No tingling, numbness, weakness.  PSYCHIATRY: No anxiety or depression.   ROS  DRUG ALLERGIES:   Allergies  Allergen Reactions  . Bee Venom Shortness Of Breath and Swelling  . Benzodiazepines     Other reaction(s): Confusion  . Diphenhydramine Other (See Comments)    Took tylenol pm and end up in the hospital something wrong with my heart  . Morphine Other (See Comments)    Reaction:  Hallucinations   . Phenylalanine Diarrhea and Nausea And Vomiting  . Sulfa Antibiotics Other (See Comments)    metallic smell and taste    VITALS:  Blood pressure (!) 150/70, pulse 85, temperature 98.8 F (37.1 C), temperature source Oral, resp. rate 18, height 5\' 4"  (1.626 m), weight 46.3 kg, SpO2 93 %.  PHYSICAL EXAMINATION:  GENERAL:  73 y.o.-year-old patient lying in the bed with no acute distress.  EYES: Pupils equal, round, reactive to light and accommodation. No scleral icterus. Extraocular muscles intact.  HEENT: Head atraumatic, normocephalic. Oropharynx and nasopharynx  clear.  NECK:  Supple, no jugular venous distention. No thyroid enlargement, no tenderness.  LUNGS: Normal breath sounds bilaterally, no wheezing, rales,rhonchi or crepitation. No use of accessory muscles of respiration.  CARDIOVASCULAR: S1, S2 normal. No murmurs, rubs, or gallops.  ABDOMEN: Soft, nontender, nondistended. Bowel sounds present. No organomegaly or mass.  EXTREMITIES: No pedal edema, cyanosis, or clubbing.  NEUROLOGIC: Cranial nerves II through XII are intact. Muscle strength 4/5 in all extremities. Sensation intact. Gait not checked.  PSYCHIATRIC: The patient is alert and oriented x 3.  SKIN: No obvious rash, lesion, or ulcer.   Physical Exam LABORATORY PANEL:   CBC Recent Labs  Lab 01/05/19 0421  WBC 4.6  HGB 9.0*  HCT 26.7*  PLT 78*   ------------------------------------------------------------------------------------------------------------------  Chemistries  Recent Labs  Lab 01/03/19 0630  01/05/19 0421  NA 141   < > 139  K 4.6   < > 3.8  CL 113*   < > 107  CO2 19*   < > 26  GLUCOSE 142*   < > 225*  BUN 52*   < > 51*  CREATININE 1.89*   < > 1.53*  CALCIUM 8.5*   < > 8.1*  AST 17  --   --   ALT 16  --   --   ALKPHOS 70  --   --   BILITOT 0.5  --   --    < > = values in this interval not displayed.   ------------------------------------------------------------------------------------------------------------------  Cardiac Enzymes No results for input(s): TROPONINI in the last 168 hours. ------------------------------------------------------------------------------------------------------------------  RADIOLOGY:  No results found.  ASSESSMENT AND PLAN:  *  Acute right periprosthetic hip fracture Status post evaluation by Dr. Patel/orthopedic surgery-conservative medical management/nonoperative treatment recommended going forward, PT/OT to evaluate/treat, continue pain protocol, fall precautions. She will need DVT prophylaxis at the rehab for this  hip fracture but her platelet counts are gradually dropping, I would like to monitor 1 more day in the hospital to see which direction her platelets are going and then further decide on prophylaxis part.  Should be ready to go to rehab tomorrow.  *Chronic diabetes mellitus type 2 Restarted glipizide, continue sliding scale insulin with Accu-Cheks per routine I will stop glipizide now and increase the dose of Levemir to avoid hypoglycemia.  *COPD without exacerbation Breathing treatments PRN  *Chronic hypertension Coreg, clonidine, hydralazine  *Chronic GERD PPI daily  Disposition pending clinical course DVT prophylaxis with Lovenox subcu for now, need to decide in 1-2 days.  All the records are reviewed and case discussed with Care Management/Social Workerr. Management plans discussed with the patient, family and they are in agreement.  CODE STATUS: full  TOTAL TIME TAKING CARE OF THIS PATIENT: 35 minutes.    POSSIBLE D/C IN 1-2 DAYS, DEPENDING ON CLINICAL CONDITION.   Vaughan Basta M.D on 01/05/2019   Between 7am to 6pm - Pager - [phone]  After 6pm go to [email or web address] - password [email or web address] [facility]  Office  [phone]  CC: Primary care physician; Alessandra Grout, MD  Note: This dictation was prepared with Dragon dictation along with smaller phrase technology. Any transcriptional errors that result from this process are unintentional.

## 2019-01-05 NOTE — Evaluation (Signed)
Physical Therapy Evaluation Patient Details Name: Anita Wilson MRN: [id number] DOB: 17-Feb-1946 Today's Date: 01/05/2019   History of Present Illness  Anita Wilson is a 73yo female who comes to [facility] on 5/[facility] after a fall and hip pain with AMB. Pt seen by orthopedist who notes a periprosthetic hip fracture with recommendations for conservative management at this time with NWB recommendations. PMH: AF, BrCA, CAD, COPD, HTN,Rt hip hemiarthroplasty 2016.   Clinical Impression  Pt admitted with above diagnosis. Pt currently with functional limitations due to the deficits listed below (see "PT Problem List"). Upon entry, pt in bed, awake and agreeable to participate. The pt is alert and oriented x4, pleasant, conversational, and a good historian. Pt educated on weight bearing orders, and technique for transfers and AMB with RW, which she is able to reproduce with a high level of detail. Despite doing being able to transfer with moderate effort, AMB is very taxing and heavily limited by pain and fatigue, pt limited to 33f AMB. Functional mobility assessment demonstrates increased effort/time requirements, poor tolerance, and need for physical assistance, whereas the patient performed these at a higher level of independence PTA. Discussed with patient what a DC to home would look like in her current state and both author and patient agree that it would be unsafe and requires extensive social support for IADL. A STR stay would help the patient improve independence prior to DC to home. Pt will benefit from skilled PT intervention to increase independence and safety with basic mobility in preparation for discharge to the venue listed below.       Follow Up Recommendations SNF;Supervision for mobility/OOB    Equipment Recommendations  None recommended by PT(will need a WC if pt elects to DC directly to home.)    Recommendations for Other Services       Precautions / Restrictions  Precautions Precautions: Fall Restrictions Weight Bearing Restrictions: Yes RLE Weight Bearing: Non weight bearing(FFWB if possible or NWB if unable)      Mobility  Bed Mobility Overal bed mobility: Modified Independent                Transfers Overall transfer level: Needs assistance Equipment used: Rolling walker (2 wheeled) Transfers: Sit to/from Stand Sit to Stand: Supervision         General transfer comment: given education and pt performs as directed, good control, strenght, and technique. Maintains NWB throughout.   Ambulation/Gait Ambulation/Gait assistance: Supervision Gait Distance (Feet): 16 Feet Assistive device: Rolling walker (2 wheeled)       General Gait Details: given education and pt performs as directed, good control, strenght, and technique. Maintains NWB throughout. (Has pain on LLE, limited by pain and fatigue. )  Stairs            Wheelchair Mobility    Modified Rankin (Stroke Patients Only)       Balance Overall balance assessment: Modified Independent;History of Falls;Mild deficits observed, not formally tested                                           Pertinent Vitals/Pain Pain Assessment: Faces Faces Pain Scale: Hurts even more Pain Location: BLE with AMB  Pain Descriptors / Indicators: Aching Pain Intervention(s): Limited activity within patient's tolerance;Monitored during session;Premedicated before session;Repositioned    Home Living Family/patient expects to be discharged to:: Private residence Living Arrangements: Alone  Type of Home: Apartment Home Access: Stairs to enter Entrance Stairs-Rails: Right Entrance Stairs-Number of Steps: "3 or 4"  Home Layout: One level Home Equipment: Walker - 2 wheels;Shower seat Additional Comments: pt reports her most recent prior fall was about 6 months ago.     Prior Function Level of Independence: Independent         Comments:  D[place], 2  sisters in [place]   Dominant Hand: Right    Extremity/Trunk Assessment   Upper Extremity Assessment Upper Extremity Assessment: Overall WFL for tasks assessed    Lower Extremity Assessment Lower Extremity Assessment: Generalized weakness       Communication      Cognition Arousal/Alertness: Awake/alert Behavior During Therapy: WFL for tasks assessed/performed Overall Cognitive Status: Within Functional Limits for tasks assessed                                        General Comments      Exercises     Assessment/Plan    PT Assessment Patient needs continued PT services  PT Problem List Decreased strength;Decreased activity tolerance;Decreased balance;Decreased range of motion;Decreased mobility;Decreased knowledge of precautions;Decreased knowledge of use of DME       PT Treatment Interventions DME instruction;Gait training;Therapeutic exercise;Wheelchair mobility training;Balance training;Stair training;Functional mobility training;Therapeutic activities;Patient/family education    PT Goals (Current goals can be found in the Care Plan section)  Acute Rehab PT Goals Patient Stated Goal: be safe and eventually independent once again with mobility PT Goal Formulation: With patient Time For Goal Achievement: 01/19/19 Potential to Achieve Goals: Good    Frequency 7X/week   Barriers to discharge Inaccessible home environment;Decreased caregiver support 4 steps to enter. Lives alone. Pt unable to AMB household distances at this time.     Co-evaluation               AM-PAC PT "6 Clicks" Mobility  Outcome Measure Help needed turning from your back to your side while in a flat bed without using bedrails?: None Help needed moving from lying on your back to sitting on the side of a flat bed without using bedrails?: None Help needed moving to and from a bed to a chair (including a wheelchair)?: None Help needed standing up from  a chair using your arms (e.g., wheelchair or bedside chair)?: None Help needed to walk in hospital room?: A Little Help needed climbing 3-5 steps with a railing? : A Little 6 Click Score: 22    End of Session Equipment Utilized During Treatment: Gait belt Activity Tolerance: Patient limited by fatigue;Patient limited by pain;Patient tolerated treatment well Patient left: in chair;with call bell/phone within reach;Other (comment)(RN ok with chair alarm off) Nurse Communication: Mobility status PT Visit Diagnosis: Other abnormalities of gait and mobility (R26.89);Difficulty in walking, not elsewhere classified (R26.2);Muscle weakness (generalized) (M62.81)    Time: 9872-1587 PT Time Calculation (min) (ACUTE ONLY): 11 min   Charges:   PT Evaluation $PT Eval Low Complexity: 1 Low PT Treatments $Gait Training: 8-22 mins        11:04 AM, 01/05/19 Etta Grandchild, PT, DPT Physical Therapist - [facility]  [phone] ([place])     Esabella Stockinger C 01/05/2019, 10:59 AM

## 2019-01-05 NOTE — TOC Progression Note (Signed)
Transition of Care [facility]) - Progression Note    Patient Details  Name: Anita Wilson MRN: [id number] Date of Birth: 1946/01/26  Transition of Care [facility]) CM/SW [place], RN Phone Number: 01/05/2019, 10:57 AM  Clinical Narrative:     Met with the patient to discuss DC plan and needs, her plan is to go to SNF for Short term rehab, she has been to [facility] in the past She is open to go to any of them As long as they do do not have Covid 19 She lives alone and does have a RW and Shower bench at home She has family close by Patient has transportation provided by family members Her PCP is Dr. Zebedee Iba Her preferred pharmacy is [facility] [place] locally Will do FL2 and bed request to find a facility for the patient  Expected Discharge Plan: Skilled Nursing Facility Barriers to Discharge: Continued Medical Work up  Expected Discharge Plan and Services Expected Discharge Plan: [place]   Discharge Planning Services: [employer] Consult Post Acute Care Choice: [place] Living arrangements for the past 2 months: Single Family Home                                       Social Determinants of Health (SDOH) Interventions    Readmission Risk Interventions No flowsheet data found.

## 2019-01-05 NOTE — TOC Progression Note (Signed)
Transition of Care [facility]) - Progression Note    Patient Details  Name: Anita Wilson MRN: [id number] Date of Birth: Aug 26, 1945  Transition of Care [facility]) CM/SW Venus, RN Phone Number: 01/05/2019, 9:19 AM  Clinical Narrative:    Patient to be evaluated by PT to determine Home health needs  CM will completed assessment after        Expected Discharge Plan and Services                                                 Social Determinants of Health (SDOH) Interventions    Readmission Risk Interventions No flowsheet data found.

## 2019-01-05 NOTE — Progress Notes (Signed)
Inpatient Diabetes Program Recommendations  AACE/ADA: New Consensus Statement on Inpatient Glycemic Control (2015)  Target Ranges:  Prepandial:   less than 140 mg/dL      Peak postprandial:   less than 180 mg/dL (1-2 hours)      Critically ill patients:  140 - 180 mg/dL   Lab Results  Component Value Date   GLUCAP 231 (H) 01/05/2019   HGBA1C 6.6 (H) 07/24/2015    Review of Glycemic Control  Diabetes history: DM2 Outpatient Diabetes medications: Lantus 12-16 units QD, glipizide 10 mg bid Current orders for Inpatient glycemic control: Lantus 10 units QHS, Novolog 0-15 units tidwc and hs, glipizide 10 mg QAM (1/2 home dose)  HgbA1C - 6.7% on 09/01/18.  Inpatient Diabetes Program Recommendations:     Increase Lantus to 14 units QHS Add Novolog 2-3 units tidwc for meal coverage insulin if pt eats > 50% meal D/C glipizide while inpatient to avoid hypoglycemia  Will follow while inpatient.  Thank you. Lorenda Peck, RD, LDN, CDE Inpatient Diabetes Coordinator [phone]

## 2019-01-05 NOTE — NC FL2 (Signed)
[place] LEVEL OF CARE SCREENING TOOL     IDENTIFICATION  Patient Name: Anita Wilson [id number] Birthdate: 1946-05-02 Sex: female Admission Date (Current Location): 01/03/2019  [place] and [place] Number:  Engineering geologist and Address:  [facility], [address], [place], [place] [address]      Provider Number:    Attending Physician Name and Address:  Vaughan Basta, *  Relative Name and Phone Number:  Anita Wilson Daughter [phone]    Current Level of Care: Hospital Recommended Level of Care: [place] Prior Approval Number:    Date Approved/Denied: 07/10/15 PASRR Number: [id number]  Discharge Plan: SNF    Current Diagnoses: Patient Active Problem List   Diagnosis Date Noted  . Hip fracture ([place]) 01/03/2019  . Hip subluxation ([place]) 07/24/2015  . Fracture of femoral neck, right ([place]) 07/10/2015  . Type 2 diabetes mellitus ([place]) 07/10/2015  . GERD (gastroesophageal reflux disease) 07/10/2015  . COPD (chronic obstructive pulmonary disease) ([place]) 07/10/2015  . CAD (coronary artery disease) 07/10/2015  . HTN (hypertension) 07/10/2015  . Chronic diastolic CHF (congestive heart failure) ([place]) 07/10/2015  . CKD (chronic kidney disease), stage III ([place]) 07/10/2015  . A-fib ([place]) 07/10/2015    Orientation RESPIRATION BLADDER Height & Weight     Self, Time, Situation, Place  Normal Continent Weight: 46.3 kg Height:  5\' 4"  (162.6 cm)  BEHAVIORAL SYMPTOMS/MOOD NEUROLOGICAL BOWEL NUTRITION STATUS      Continent Diet(regular)  AMBULATORY STATUS COMMUNICATION OF NEEDS Skin   Extensive Assist Verbally Normal                       Personal Care Assistance Level of Assistance  Dressing, Bathing Bathing Assistance: Limited assistance   Dressing Assistance: Limited assistance     Functional Limitations Info  Sight, Hearing, Speech Sight Info: Adequate Hearing Info: Adequate Speech Info:  Adequate    SPECIAL CARE FACTORS FREQUENCY  PT (By licensed PT)     PT Frequency: 5 times per week              Contractures Contractures Info: Not present    Additional Factors Info  Code Status, Allergies Code Status Info: full Allergies Info: Bee Venom, Benzodiazepines, Diphenhydramine, Morphine, Phenylalanine, Sulfa Antibiotics           Current Medications (01/05/2019):  This is the current hospital active medication list Current Facility-Administered Medications  Medication Dose Route Frequency Provider Last Rate Last Dose  . acetaminophen (TYLENOL) tablet 650 mg  650 mg Oral Q4H PRN Loney Hering D, MD   650 mg at 01/05/19 0906  . albuterol (PROVENTIL) (2.5 MG/3ML) 0.083% nebulizer solution 2.5 mg  2.5 mg Nebulization Q6H PRN Salary, Montell D, MD      . aspirin EC tablet 81 mg  81 mg Oral Daily Salary, Montell D, MD   81 mg at 01/05/19 0906  . atorvastatin (LIPITOR) tablet 40 mg  40 mg Oral q1800 Salary, Montell D, MD   40 mg at 01/04/19 1759  . carvedilol (COREG) tablet 25 mg  25 mg Oral BID Loney Hering D, MD   25 mg at 01/05/19 0906  . cholecalciferol (VITAMIN D3) tablet 2,000 Units  2,000 Units Oral Daily Loney Hering D, MD   2,000 Units at 01/05/19 0906  . citalopram (CELEXA) tablet 10 mg  10 mg Oral Daily Salary, Montell D, MD   10 mg at 01/05/19 0906  . cloNIDine (CATAPRES) tablet 0.1 mg  0.1 mg Oral QHS Salary, Montell D, MD   0.1 mg at 01/04/19 2113  . docusate sodium (COLACE) capsule 100 mg  100 mg Oral BID Loney Hering D, MD   100 mg at 01/05/19 0906  . enoxaparin (LOVENOX) injection 30 mg  30 mg Subcutaneous Q24H Salary, Montell D, MD   30 mg at 01/04/19 1147  . fentaNYL (SUBLIMAZE) injection 25 mcg  25 mcg Intravenous Q30 min PRN Hinda Kehr, MD      . ferrous sulfate tablet 325 mg  325 mg Oral Daily Salary, Montell D, MD   325 mg at 01/05/19 0906  . [START ON 01/06/2019] glipiZIDE (GLUCOTROL) tablet 10 mg  10 mg Oral QAC breakfast Vaughan Basta, MD      . hydrALAZINE (APRESOLINE) injection 10 mg  10 mg Intravenous Q4H PRN Salary, Montell D, MD   10 mg at 01/05/19 0453  . hydrALAZINE (APRESOLINE) tablet 10 mg  10 mg Oral TID Loney Hering D, MD   10 mg at 01/05/19 0907  . HYDROcodone-acetaminophen (NORCO/VICODIN) 5-325 MG per tablet 1-2 tablet  1-2 tablet Oral Q4H PRN Salary, Montell D, MD      . HYDROmorphone (DILAUDID) injection 0.5 mg  0.5 mg Intravenous Q2H PRN Salary, Montell D, MD      . insulin aspart (novoLOG) injection 0-15 Units  0-15 Units Subcutaneous TID WC Gorden Harms, MD   5 Units at 01/05/19 [id number]  . insulin aspart (novoLOG) injection 0-5 Units  0-5 Units Subcutaneous QHS Salary, Montell D, MD      . insulin glargine (LANTUS) injection 10 Units  10 Units Subcutaneous QHS Loney Hering D, MD   10 Units at 01/04/19 2114  . isosorbide mononitrate (IMDUR) 24 hr tablet 30 mg  30 mg Oral Daily Salary, Montell D, MD   30 mg at 01/05/19 0906  . letrozole [facility]) tablet 2.5 mg  2.5 mg Oral Daily Salary, Montell D, MD   2.5 mg at 01/05/19 0907  . multivitamin with minerals tablet 1 tablet  1 tablet Oral Daily Salary, Avel Peace, MD   1 tablet at 01/05/19 0906  . ondansetron (ZOFRAN) injection 4 mg  4 mg Intravenous Once Hinda Kehr, MD      . ondansetron [facility]) tablet 4 mg  4 mg Oral Q6H PRN Salary, Montell D, MD       Or  . ondansetron (ZOFRAN) injection 4 mg  4 mg Intravenous Q6H PRN Salary, Montell D, MD      . pantoprazole (PROTONIX) EC tablet 40 mg  40 mg Oral Daily Salary, Montell D, MD   40 mg at 01/05/19 0906  . polyethylene glycol (MIRALAX / GLYCOLAX) packet 17 g  17 g Oral Daily PRN Salary, Montell D, MD      . protein supplement (ENSURE MAX) liquid  11 oz Oral Daily Salary, Montell D, MD   11 oz at 01/04/19 2112  . tiotropium (SPIRIVA) inhalation capsule ([facility] use ONLY) 18 mcg  18 mcg Inhalation q morning - 10a Salary, Montell D, MD   18 mcg at 01/05/19 0914  . traZODone (DESYREL) tablet 50 mg   50 mg Oral QHS PRN Salary, Avel Peace, MD         Discharge Medications: Please see discharge summary for a list of discharge medications.  Relevant Imaging Results:  Relevant Lab Results:   Additional Information [id number]  Su Hilt, RN

## 2019-01-05 NOTE — Progress Notes (Signed)
Patient refused 14 units of lantis. Notified Dr. Jannifer Franklin to modify order. Patient wants to take 12 units of Lantis. [place] ordered by Dr. Jannifer Franklin

## 2019-01-06 LAB — CBC
HCT: 28.4 % — ABNORMAL LOW (ref 36.0–46.0)
Hemoglobin: 9.4 g/dL — ABNORMAL LOW (ref 12.0–15.0)
MCH: 31.3 pg (ref 26.0–34.0)
MCHC: 33.1 g/dL (ref 30.0–36.0)
MCV: 94.7 fL (ref 80.0–100.0)
Platelets: 90 10*3/uL — ABNORMAL LOW (ref 150–400)
RBC: 3 MIL/uL — ABNORMAL LOW (ref 3.87–5.11)
RDW: 13.1 % (ref 11.5–15.5)
WBC: 4.4 10*3/uL (ref 4.0–10.5)
nRBC: 0 % (ref 0.0–0.2)

## 2019-01-06 LAB — GLUCOSE, CAPILLARY
Glucose-Capillary: 135 mg/dL — ABNORMAL HIGH (ref 70–99)
Glucose-Capillary: 178 mg/dL — ABNORMAL HIGH (ref 70–99)

## 2019-01-06 MED ORDER — ENOXAPARIN SODIUM 30 MG/0.3ML ~~LOC~~ SOLN: 30.0000 mg | SUBCUTANEOUS | 0 refills | Status: DC

## 2019-01-06 MED ORDER — HYDROCODONE-ACETAMINOPHEN 5-325 MG PO TABS: 1.0000 | ORAL_TABLET | ORAL | 0 refills | Status: DC | PRN

## 2019-01-06 MED ORDER — POLYETHYLENE GLYCOL 3350 17 G PO PACK: 17.0000 g | PACK | Freq: Every day | ORAL | 0 refills | Status: DC | PRN

## 2019-01-06 MED ORDER — DOCUSATE SODIUM 100 MG PO CAPS: 100.0000 mg | ORAL_CAPSULE | Freq: Two times a day (BID) | ORAL | 0 refills | Status: DC

## 2019-01-06 NOTE — Progress Notes (Signed)
Report called to compass. VSS. Pt in no acute distress. EMS called to transport the pt.

## 2019-01-06 NOTE — TOC Progression Note (Signed)
Transition of Care [facility]) - Progression Note    Patient Details  Name: Anita Wilson MRN: [id number] Date of Birth: 11-09-1945  Transition of Care [facility]) CM/SW Contact  Su Hilt, RN Phone Number: 01/06/2019, 10:41 AM  Clinical Narrative:    Anita Wilson from [facility] called today and said that they are unable to accept the patient due to an outstanding previous bill We reviewed her other choices which is Leisure centre manager , she chose [facility], I alerted [employer] from [place] / [employer], sent accaptance thru the hub as well   Expected Discharge Plan: Skilled Nursing Facility Barriers to Discharge: Continued Medical Work up  Expected Discharge Plan and Services Expected Discharge Plan: [place]   Discharge Planning Services: [employer] Consult Post Acute Care Choice: [place] arrangements for the past 2 months: Single Family Home                                       Social Determinants of Health (SDOH) Interventions    Readmission Risk Interventions No flowsheet data found.

## 2019-01-06 NOTE — Discharge Summary (Signed)
[place] [facility] [place] NAME: Anita Wilson    MR#:  [id number]  DATE OF BIRTH:  [birth year]  DATE OF ADMISSION:  01/03/2019 ADMITTING PHYSICIAN: Gorden Harms, MD  DATE OF DISCHARGE: 01/06/2019   PRIMARY CARE PHYSICIAN: Alessandra Grout, MD    ADMISSION DIAGNOSIS:  Closed fracture of proximal end of right femur, initial encounter ([place]) [S72.001A]  DISCHARGE DIAGNOSIS:  Active Problems:   Hip fracture ([place])   SECONDARY DIAGNOSIS:   Past Medical History:  Diagnosis Date  . A-fib ([place])   . Allergy   . Breast cancer ([place])   . CAD (coronary artery disease)   . Chronic diastolic heart failure ([place])   . CKD (chronic kidney disease), stage III ([place])   . COPD (chronic obstructive pulmonary disease) ([place])   . Depression   . Diabetes mellitus without complication ([place])   . Hypertension   . Pulmonary HTN [facility])     HOSPITAL COURSE:   *Acute right periprosthetic hip fracture Status post evaluation by Dr. Patel/orthopedic surgery-conservative medical management/nonoperative treatment recommended going forward, PT/OT to evaluate/treat, continue pain protocol, fall precautions. She will need DVT prophylaxis at the rehab for this hip fracture but her platelet counts are gradually dropping, I would like to monitor 1 more day in the hospital to see which direction her platelets are going and then further decide on prophylaxis part.  Should be ready to go to rehab tomorrow.  Her Platelets raised some and 90 on day of discharge, so we can cont lovenox for now.  *Chronic diabetes mellitus type 2 Restarted glipizide, continue sliding scale insulin with Accu-Cheks per routine I will stop glipizide now and increase the dose of Levemir to avoid hypoglycemia.  *COPD without exacerbation Breathing treatments PRN  *Chronic hypertension Coreg, clonidine, hydralazine  *Chronic GERD PPI daily   DISCHARGE CONDITIONS:    Stable.  CONSULTS OBTAINED:  Treatment Team:  Leim Fabry, MD  DRUG ALLERGIES:   Allergies  Allergen Reactions  . Bee Venom Shortness Of Breath and Swelling  . Benzodiazepines     Other reaction(s): Confusion  . Diphenhydramine Other (See Comments)    Took tylenol pm and end up in the hospital something wrong with my heart  . Morphine Other (See Comments)    Reaction:  Hallucinations   . Phenylalanine Diarrhea and Nausea And Vomiting  . Sulfa Antibiotics Other (See Comments)    metallic smell and taste    DISCHARGE MEDICATIONS:   Allergies as of 01/06/2019      Reactions   Bee Venom Shortness Of Breath, Swelling   Benzodiazepines    Other reaction(s): Confusion   Diphenhydramine Other (See Comments)   Took tylenol pm and end up in the hospital something wrong with my heart   Morphine Other (See Comments)   Reaction:  Hallucinations    Phenylalanine Diarrhea, Nausea And Vomiting   Sulfa Antibiotics Other (See Comments)   metallic smell and taste      Medication List    STOP taking these medications   hydrochlorothiazide 25 MG tablet Commonly known as:  HYDRODIURIL     TAKE these medications   acetaminophen 325 MG tablet Commonly known as:  TYLENOL Take 650 mg by mouth every 4 (four) hours as needed for mild pain.   aspirin EC 81 MG tablet Take 81 mg by mouth daily.   atorvastatin 40 MG tablet Commonly known as:  LIPITOR Take 40 mg by mouth daily.   carvedilol  25 MG tablet Commonly known as:  COREG Take 25 mg by mouth 2 (two) times daily.   cholecalciferol 1000 units tablet Commonly known as:  VITAMIN D Take 2,000 Units by mouth daily.   citalopram 10 MG tablet Commonly known as:  CELEXA Take 10 mg by mouth daily.   cloNIDine 0.1 MG tablet Commonly known as:  CATAPRES TAKE 1 TABLET BY MOUTH  NIGHTLY   docusate sodium 100 MG capsule Commonly known as:  COLACE Take 1 capsule (100 mg total) by mouth 2 (two) times daily.   enoxaparin 30  MG/0.3ML injection Commonly known as:  LOVENOX Inject 0.3 mLs (30 mg total) into the skin daily for 14 days.   ferrous sulfate 325 (65 FE) MG tablet Take 1 tablet (325 mg total) by mouth 3 (three) times daily after meals. What changed:  when to take this   glipiZIDE 10 MG tablet Commonly known as:  GLUCOTROL Take 10 mg by mouth 2 (two) times daily.   hydrALAZINE 10 MG tablet Commonly known as:  APRESOLINE Take 10 mg by mouth 3 (three) times daily.   HYDROcodone-acetaminophen 5-325 MG tablet Commonly known as:  NORCO/VICODIN Take 1 tablet by mouth every 4 (four) hours as needed for moderate pain or severe pain.   insulin glargine 100 UNIT/ML injection Commonly known as:  Lantus Inject 0.1 mLs (10 Units total) into the skin at bedtime. What changed:  how much to take   isosorbide mononitrate 30 MG 24 hr tablet Commonly known as:  IMDUR Take 30 mg by mouth daily.   letrozole 2.5 MG tablet Commonly known as:  FEMARA Take 2.5 mg by mouth daily.   omeprazole 20 MG capsule Commonly known as:  PRILOSEC Take 20 mg by mouth daily.   polyethylene glycol 17 g packet Commonly known as:  MIRALAX / GLYCOLAX Take 17 g by mouth daily as needed for mild constipation.   tiotropium 18 MCG inhalation capsule Commonly known as:  SPIRIVA Place 18 mcg into inhaler and inhale daily.        DISCHARGE INSTRUCTIONS:    Follow with ortho clinc in 1-2 weeks.  If you experience worsening of your admission symptoms, develop shortness of breath, life threatening emergency, suicidal or homicidal thoughts you must seek medical attention immediately by calling 911 or calling your MD immediately  if symptoms less severe.  You Must read complete instructions/literature along with all the possible adverse reactions/side effects for all the Medicines you take and that have been prescribed to you. Take any new Medicines after you have completely understood and accept all the possible adverse  reactions/side effects.   Please note  You were cared for by a hospitalist during your hospital stay. If you have any questions about your discharge medications or the care you received while you were in the hospital after you are discharged, you can call the unit and asked to speak with the hospitalist on call if the hospitalist that took care of you is not available. Once you are discharged, your primary care physician will handle any further medical issues. Please note that NO REFILLS for any discharge medications will be authorized once you are discharged, as it is imperative that you return to your primary care physician (or establish a relationship with a primary care physician if you do not have one) for your aftercare needs so that they can reassess your need for medications and monitor your lab values.    Today   CHIEF COMPLAINT:   Chief  Complaint  Patient presents with  . Fall  . Leg Pain    HISTORY OF PRESENT ILLNESS:  Anita Wilson  is a 73 y.o. female status post fall resulting in hip fracture, patient is now admitted for further evaluation/care.    VITAL SIGNS:  Blood pressure (!) 145/69, pulse 65, temperature 98 F (36.7 C), temperature source Oral, resp. rate 17, height 5\' 4"  (1.626 m), weight 45.8 kg, SpO2 97 %.  I/O:    Intake/Output Summary (Last 24 hours) at 01/06/2019 1140 Last data filed at 01/06/2019 0942 Gross per 24 hour  Intake 480 ml  Output 800 ml  Net -320 ml    PHYSICAL EXAMINATION:   GENERAL:  73 y.o.-year-old patient lying in the bed with no acute distress.  EYES: Pupils equal, round, reactive to light and accommodation. No scleral icterus. Extraocular muscles intact.  HEENT: Head atraumatic, normocephalic. Oropharynx and nasopharynx clear.  NECK:  Supple, no jugular venous distention. No thyroid enlargement, no tenderness.  LUNGS: Normal breath sounds bilaterally, no wheezing, rales,rhonchi or crepitation. No use of accessory muscles of  respiration.  CARDIOVASCULAR: S1, S2 normal. No murmurs, rubs, or gallops.  ABDOMEN: Soft, nontender, nondistended. Bowel sounds present. No organomegaly or mass.  EXTREMITIES: No pedal edema, cyanosis, or clubbing.  NEUROLOGIC: Cranial nerves II through XII are intact. Muscle strength 4/5 in all extremities. Sensation intact. Gait not checked.  PSYCHIATRIC: The patient is alert and oriented x 3.  SKIN: No obvious rash, lesion, or ulcer.   DATA REVIEW:   CBC Recent Labs  Lab 01/06/19 0453  WBC 4.4  HGB 9.4*  HCT 28.4*  PLT 90*    Chemistries  Recent Labs  Lab 01/03/19 0630  01/05/19 0421  NA 141   < > 139  K 4.6   < > 3.8  CL 113*   < > 107  CO2 19*   < > 26  GLUCOSE 142*   < > 225*  BUN 52*   < > 51*  CREATININE 1.89*   < > 1.53*  CALCIUM 8.5*   < > 8.1*  AST 17  --   --   ALT 16  --   --   ALKPHOS 70  --   --   BILITOT 0.5  --   --    < > = values in this interval not displayed.    Cardiac Enzymes No results for input(s): TROPONINI in the last 168 hours.  Microbiology Results  Results for orders placed or performed during the hospital encounter of 01/03/19  SARS Coronavirus 2 (CEPHEID - Performed in [place] hospital lab), Hosp Order     Status: None   Collection Time: 01/03/19  6:30 AM  Result Value Ref Range Status   SARS Coronavirus 2 NEGATIVE NEGATIVE Final    Comment: (NOTE) If result is NEGATIVE SARS-CoV-2 target nucleic acids are NOT DETECTED. The SARS-CoV-2 RNA is generally detectable in upper and lower  respiratory specimens during the acute phase of infection. The lowest  concentration of SARS-CoV-2 viral copies this assay can detect is 250  copies / mL. A negative result does not preclude SARS-CoV-2 infection  and should not be used as the sole basis for treatment or other  patient management decisions.  A negative result may occur with  improper specimen collection / handling, submission of specimen other  than nasopharyngeal swab, presence  of viral mutation(s) within the  areas targeted by this assay, and inadequate number of viral copies  (<250  copies / mL). A negative result must be combined with clinical  observations, patient history, and epidemiological information. If result is POSITIVE SARS-CoV-2 target nucleic acids are DETECTED. The SARS-CoV-2 RNA is generally detectable in upper and lower  respiratory specimens dur ing the acute phase of infection.  Positive  results are indicative of active infection with SARS-CoV-2.  Clinical  correlation with patient history and other diagnostic information is  necessary to determine patient infection status.  Positive results do  not rule out bacterial infection or co-infection with other viruses. If result is PRESUMPTIVE POSTIVE SARS-CoV-2 nucleic acids MAY BE PRESENT.   A presumptive positive result was obtained on the submitted specimen  and confirmed on repeat testing.  While 2019 novel coronavirus  (SARS-CoV-2) nucleic acids may be present in the submitted sample  additional confirmatory testing may be necessary for epidemiological  and / or clinical management purposes  to differentiate between  SARS-CoV-2 and other Sarbecovirus currently known to infect humans.  If clinically indicated additional testing with an alternate test  methodology [phone]) is advised. The SARS-CoV-2 RNA is generally  detectable in upper and lower respiratory sp ecimens during the acute  phase of infection. The expected result is Negative. Fact Sheet for Patients:  StrictlyIdeas.no Fact Sheet for Healthcare Providers: BankingDealers.co.za This test is not yet approved or cleared by the [place] FDA and has been authorized for detection and/or diagnosis of SARS-CoV-2 by FDA under an Emergency Use Authorization (EUA).  This EUA will remain in effect (meaning this test can be used) for the duration of the COVID-19 declaration under Section  564(b)(1) of the Act, 21 U.S.C. section 360bbb-3(b)(1), unless the authorization is terminated or revoked sooner. Performed at [facility], [address], [place], [place] [address]     RADIOLOGY:  No results found.  EKG:   Orders placed or performed during the hospital encounter of 01/03/19  . ED EKG  . ED EKG  . EKG 12-Lead  . EKG 12-Lead      Management plans discussed with the patient, family and they are in agreement.  CODE STATUS:     Code Status Orders  (From admission, onward)         Start     Ordered   01/03/19 1025  Full code  Continuous     01/03/19 1024        Code Status History    Date Active Date Inactive Code Status Order ID Comments User Context   07/25/2015 1758 07/29/2015 1930 Full Code [id number]  Corky Mull, MD Inpatient   07/25/2015 0031 07/25/2015 1758 Full Code [id number]  Harrie Foreman, MD Inpatient   07/24/2015 2315 07/25/2015 0031 Full Code [id number]  Hessie Knows, MD ED   07/10/2015 1904 07/13/2015 1650 Full Code [id number]  Poggi, Marshall Cork, MD Inpatient   07/10/2015 0210 07/10/2015 1904 Full Code [id number]  Lance Coon, MD ED   07/10/2015 0021 07/10/2015 0210 Full Code [id number]  Hooten, Laurice Record, MD ED      TOTAL TIME TAKING CARE OF THIS PATIENT: 35 minutes.    Vaughan Basta M.D on 01/06/2019 at 11:40 AM  Between 7am to 6pm - Pager - [phone]  After 6pm go to [email or web address] - password [email or web address] [place] [facility]  Office  [phone]  CC: Primary care physician; Alessandra Grout, MD   Note: This dictation was prepared with Dragon dictation along with smaller phrase technology. Any transcriptional errors that result from this process are unintentional.

## 2019-01-06 NOTE — Progress Notes (Signed)
Physical Therapy Treatment Patient Details Name: Anita Wilson MRN: [id number] DOB: [birth year] Today's Date: 01/06/2019    History of Present Illness Anita Wilson is a 73yo female who comes to [facility] on 5/12 after a fall and hip pain with AMB. Pt seen by orthopedist who notes a periprosthetic hip fracture with recommendations for conservative management at this time with NWB recommendations. PMH: AF, BrCA, CAD, COPD, HTN,Rt hip hemiarthroplasty 2016.     PT Comments    Pt received in bed asleep, awakened by greeting. Pt agreeable to PT treatment. Pt continues to perform bed mobility and transfers without physical assistance while maintaining Rt NWB, however requires supervision and cuing for safe return to chair each time, often sitting prematurely. Pt able to progress to 3 bouts of AMB, still all less than 21f. RUE pain/fatigue occur in the Right elbow/triceps after 2nd bout, which limits third bout. Pt still c/o Right heel pain, which upon inspection has non-blanching erythema and pain to touch consistent with typical presentation of a stage one pressure injury. Pt educated on floating her heels while in bed since she has adequate strength and cognition to do so. Pt progressing well overall, but has limitations in fatigue and pain that would make a DC to home risky at this time.   Follow Up Recommendations  SNF;Supervision for mobility/OOB     Equipment Recommendations  None recommended by PT(will need a WC if DC to home. )    Recommendations for Other Services       Precautions / Restrictions Precautions Precautions: Fall Restrictions Weight Bearing Restrictions: Yes RLE Weight Bearing: Non weight bearing    Mobility  Bed Mobility Overal bed mobility: Modified Independent                Transfers Overall transfer level: Needs assistance Equipment used: Rolling walker (2 wheeled) Transfers: Sit to/from Stand Sit to Stand: Supervision         General  transfer comment: pt performs as directed 1DA, good control, strenght, and technique. Maintains NWB RLE throughout. But frequently opts to sit prior to fulling turning to chair despite VC multiple times.   Ambulation/Gait   Gait Distance (Feet): 38 Feet(332f [id number] Assistive device: Rolling walker (2 wheeled)   Gait velocity: has worseing pain in right arm after 2nd bout which limits third bout, pt reports chronic RUE qweakness s/p mastectomy. Responds favorably to myofascial stretch of triceps which is more restricted distally.    General Gait Details: given education and pt performs as directed, good control, strenght, and technique. Maintains NWB throughout.    Stairs             Wheelchair Mobility    Modified Rankin (Stroke Patients Only)       Balance Overall balance assessment: Modified Independent;History of Falls;Mild deficits observed, not formally tested                                          Cognition Arousal/Alertness: Awake/alert Behavior During Therapy: WFL for tasks assessed/performed Overall Cognitive Status: Within Functional Limits for tasks assessed                                        Exercises      General Comments  Pertinent Vitals/Pain Faces Pain Scale: Hurts whole lot Pain Location: Worst in Right heel (nonblancing erythema); Right triceps/elbow at end of session Pain Descriptors / Indicators: Aching Pain Intervention(s): Limited activity within patient's tolerance;Monitored during session;Repositioned;Premedicated before session    Home Living                      Prior Function            PT Goals (current goals can now be found in the care plan section) Acute Rehab PT Goals Patient Stated Goal: be safe and eventually independent once again with mobility PT Goal Formulation: With patient Time For Goal Achievement: 01/19/19 Potential to Achieve Goals: Good Progress  towards PT goals: Progressing toward goals    Frequency    7X/week      PT Plan Current plan remains appropriate    Co-evaluation              AM-PAC PT "6 Clicks" Mobility   Outcome Measure  Help needed turning from your back to your side while in a flat bed without using bedrails?: None Help needed moving from lying on your back to sitting on the side of a flat bed without using bedrails?: None Help needed moving to and from a bed to a chair (including a wheelchair)?: None Help needed standing up from a chair using your arms (e.g., wheelchair or bedside chair)?: None Help needed to walk in hospital room?: A Little Help needed climbing 3-5 steps with a railing? : A Little 6 Click Score: 22    End of Session Equipment Utilized During Treatment: Gait belt Activity Tolerance: Patient limited by fatigue;Patient limited by pain;Patient tolerated treatment well Patient left: in chair;with call bell/phone within reach;Other (comment) Nurse Communication: Mobility status PT Visit Diagnosis: Other abnormalities of gait and mobility (R26.89);Difficulty in walking, not elsewhere classified (R26.2);Muscle weakness (generalized) (M62.81)     Time: 3335-4562 PT Time Calculation (min) (ACUTE ONLY): 28 min  Charges:  $Gait Training: 23-37 mins                     10:22 AM, 01/06/19 Etta Grandchild, PT, DPT Physical Therapist - [facility]  [phone] ([place])     [place] 01/06/2019, 10:18 AM

## 2019-01-06 NOTE — Care Management Important Message (Signed)
Important Message  Patient Details  Name: Anita Wilson MRN: [id number] Date of Birth: 24-May-1946   Medicare Important Message Given:  Yes    [name] A Addysen Louth 01/06/2019, 10:48 AM

## 2019-01-06 NOTE — TOC Transition Note (Signed)
Transition of Care [facility]) - CM/SW Discharge Note   Patient Details  Name: Anita Wilson MRN: [id number] Date of Birth: [birth year]  Transition of Care [facility] CM/SW Contact:  Su Hilt, RN Phone Number: 01/06/2019, 11:59 AM   Clinical Narrative:    Patient to DC to room E10 at [facility], Bedside nurse to call report to [phone], patient to transport via EMS, patient will complete her own Admission paper work at the facility Daughter Healther Agne made aware by the patient   Final next level of care: Skilled Nursing Facility Barriers to Discharge: Barriers Resolved   Patient Goals and CMS Choice Patient states their goals for this hospitalization and ongoing recovery are:: go to short term rehab CMS Medicare.gov Compare Post Acute Care list provided to:: Patient    Discharge Placement                       Discharge Plan and Services   Discharge Planning Services: CM Consult Post Acute Care Choice: [place]                               Social Determinants of Health (SDOH) Interventions     Readmission Risk Interventions No flowsheet data found.

## 2019-03-01 ENCOUNTER — Emergency Department: Payer: Medicare Other

## 2019-03-01 ENCOUNTER — Other Ambulatory Visit: Payer: Self-pay

## 2019-03-01 ENCOUNTER — Encounter: Payer: Self-pay | Admitting: *Deleted

## 2019-03-01 ENCOUNTER — Inpatient Hospital Stay
Admission: EM | Admit: 2019-03-01 | Discharge: 2019-03-02 | DRG: 536 | Disposition: A | Discharge status: Skilled Nursing Facility | Payer: Medicare Other | Attending: Internal Medicine | Admitting: Internal Medicine

## 2019-03-01 DIAGNOSIS — Z7982 Long term (current) use of aspirin: Secondary | ICD-10-CM

## 2019-03-01 DIAGNOSIS — Z8249 Family history of ischemic heart disease and other diseases of the circulatory system: Secondary | ICD-10-CM | POA: Diagnosis not present

## 2019-03-01 DIAGNOSIS — W010XXA Fall on same level from slipping, tripping and stumbling without subsequent striking against object, initial encounter: Secondary | ICD-10-CM | POA: Diagnosis present

## 2019-03-01 DIAGNOSIS — K219 Gastro-esophageal reflux disease without esophagitis: Secondary | ICD-10-CM | POA: Diagnosis present

## 2019-03-01 DIAGNOSIS — Z794 Long term (current) use of insulin: Secondary | ICD-10-CM

## 2019-03-01 DIAGNOSIS — Z79811 Long term (current) use of aromatase inhibitors: Secondary | ICD-10-CM | POA: Diagnosis not present

## 2019-03-01 DIAGNOSIS — Z79891 Long term (current) use of opiate analgesic: Secondary | ICD-10-CM

## 2019-03-01 DIAGNOSIS — S32301A Unspecified fracture of right ilium, initial encounter for closed fracture: Principal | ICD-10-CM | POA: Diagnosis present

## 2019-03-01 DIAGNOSIS — F17210 Nicotine dependence, cigarettes, uncomplicated: Secondary | ICD-10-CM | POA: Diagnosis present

## 2019-03-01 DIAGNOSIS — Z1159 Encounter for screening for other viral diseases: Secondary | ICD-10-CM

## 2019-03-01 DIAGNOSIS — Z833 Family history of diabetes mellitus: Secondary | ICD-10-CM

## 2019-03-01 DIAGNOSIS — J449 Chronic obstructive pulmonary disease, unspecified: Secondary | ICD-10-CM | POA: Diagnosis present

## 2019-03-01 DIAGNOSIS — E1122 Type 2 diabetes mellitus with diabetic chronic kidney disease: Secondary | ICD-10-CM | POA: Diagnosis present

## 2019-03-01 DIAGNOSIS — Z853 Personal history of malignant neoplasm of breast: Secondary | ICD-10-CM

## 2019-03-01 DIAGNOSIS — I10 Essential (primary) hypertension: Secondary | ICD-10-CM | POA: Diagnosis present

## 2019-03-01 DIAGNOSIS — N183 Chronic kidney disease, stage 3 unspecified: Secondary | ICD-10-CM | POA: Diagnosis present

## 2019-03-01 DIAGNOSIS — S3282XA Multiple fractures of pelvis without disruption of pelvic ring, initial encounter for closed fracture: Secondary | ICD-10-CM

## 2019-03-01 DIAGNOSIS — I480 Paroxysmal atrial fibrillation: Secondary | ICD-10-CM | POA: Diagnosis present

## 2019-03-01 DIAGNOSIS — Z7951 Long term (current) use of inhaled steroids: Secondary | ICD-10-CM | POA: Diagnosis not present

## 2019-03-01 DIAGNOSIS — E119 Type 2 diabetes mellitus without complications: Secondary | ICD-10-CM

## 2019-03-01 DIAGNOSIS — Z951 Presence of aortocoronary bypass graft: Secondary | ICD-10-CM | POA: Diagnosis not present

## 2019-03-01 DIAGNOSIS — Z96641 Presence of right artificial hip joint: Secondary | ICD-10-CM | POA: Diagnosis present

## 2019-03-01 DIAGNOSIS — I129 Hypertensive chronic kidney disease with stage 1 through stage 4 chronic kidney disease, or unspecified chronic kidney disease: Secondary | ICD-10-CM | POA: Diagnosis present

## 2019-03-01 DIAGNOSIS — S32591A Other specified fracture of right pubis, initial encounter for closed fracture: Secondary | ICD-10-CM | POA: Diagnosis present

## 2019-03-01 DIAGNOSIS — I2510 Atherosclerotic heart disease of native coronary artery without angina pectoris: Secondary | ICD-10-CM | POA: Diagnosis present

## 2019-03-01 DIAGNOSIS — S329XXA Fracture of unspecified parts of lumbosacral spine and pelvis, initial encounter for closed fracture: Secondary | ICD-10-CM | POA: Diagnosis present

## 2019-03-01 LAB — CBC WITH DIFFERENTIAL/PLATELET
Abs Immature Granulocytes: 0.06 10*3/uL (ref 0.00–0.07)
Basophils Absolute: 0 10*3/uL (ref 0.0–0.1)
Basophils Relative: 0 %
Eosinophils Absolute: 0 10*3/uL (ref 0.0–0.5)
Eosinophils Relative: 0 %
HCT: 32.7 % — ABNORMAL LOW (ref 36.0–46.0)
Hemoglobin: 10.5 g/dL — ABNORMAL LOW (ref 12.0–15.0)
Immature Granulocytes: 1 %
Lymphocytes Relative: 8 %
Lymphs Abs: 0.7 10*3/uL (ref 0.7–4.0)
MCH: 32 pg (ref 26.0–34.0)
MCHC: 32.1 g/dL (ref 30.0–36.0)
MCV: 99.7 fL (ref 80.0–100.0)
Monocytes Absolute: 0.5 10*3/uL (ref 0.1–1.0)
Monocytes Relative: 5 %
Neutro Abs: 7.8 10*3/uL — ABNORMAL HIGH (ref 1.7–7.7)
Neutrophils Relative %: 86 %
Platelets: 120 10*3/uL — ABNORMAL LOW (ref 150–400)
RBC: 3.28 MIL/uL — ABNORMAL LOW (ref 3.87–5.11)
RDW: 13.4 % (ref 11.5–15.5)
WBC: 9.1 10*3/uL (ref 4.0–10.5)
nRBC: 0 % (ref 0.0–0.2)

## 2019-03-01 LAB — BASIC METABOLIC PANEL
Anion gap: 8 (ref 5–15)
BUN: 42 mg/dL — ABNORMAL HIGH (ref 8–23)
CO2: 21 mmol/L — ABNORMAL LOW (ref 22–32)
Calcium: 8.4 mg/dL — ABNORMAL LOW (ref 8.9–10.3)
Chloride: 111 mmol/L (ref 98–111)
Creatinine, Ser: 1.54 mg/dL — ABNORMAL HIGH (ref 0.44–1.00)
GFR calc Af Amer: 39 mL/min — ABNORMAL LOW (ref >60)
GFR calc non Af Amer: 33 mL/min — ABNORMAL LOW (ref >60)
Glucose, Bld: 144 mg/dL — ABNORMAL HIGH (ref 70–99)
Potassium: 4.6 mmol/L (ref 3.5–5.1)
Sodium: 140 mmol/L (ref 135–145)

## 2019-03-01 LAB — SARS CORONAVIRUS 2 BY RT PCR (HOSPITAL ORDER, PERFORMED IN ~~LOC~~ HOSPITAL LAB): SARS Coronavirus 2: NEGATIVE

## 2019-03-01 LAB — GLUCOSE, CAPILLARY: Glucose-Capillary: 195 mg/dL — ABNORMAL HIGH (ref 70–99)

## 2019-03-01 MED ORDER — ASPIRIN EC 81 MG PO TBEC
81.0000 mg | DELAYED_RELEASE_TABLET | Freq: Every day | ORAL | Status: DC
  Administered 2019-03-02: 10:00:00 81 mg via ORAL
  Filled 2019-03-01: qty 1

## 2019-03-01 MED ORDER — CITALOPRAM HYDROBROMIDE 20 MG PO TABS
10.0000 mg | ORAL_TABLET | Freq: Every day | ORAL | Status: DC
  Administered 2019-03-02: 10 mg via ORAL
  Filled 2019-03-01: qty 1

## 2019-03-01 MED ORDER — ISOSORBIDE MONONITRATE ER 30 MG PO TB24
30.0000 mg | ORAL_TABLET | Freq: Every day | ORAL | Status: DC
  Administered 2019-03-02: 10:00:00 30 mg via ORAL
  Filled 2019-03-01: qty 1

## 2019-03-01 MED ORDER — CLONIDINE HCL 0.1 MG PO TABS
0.1000 mg | ORAL_TABLET | Freq: Every day | ORAL | Status: DC
  Administered 2019-03-02: 0.1 mg via ORAL
  Filled 2019-03-01: qty 1

## 2019-03-01 MED ORDER — OXYCODONE HCL 5 MG PO TABS
5.0000 mg | ORAL_TABLET | ORAL | Status: DC | PRN
  Administered 2019-03-01 – 2019-03-02 (×4): 5 mg via ORAL
  Filled 2019-03-01 (×4): qty 1

## 2019-03-01 MED ORDER — HYDRALAZINE HCL 10 MG PO TABS
10.0000 mg | ORAL_TABLET | Freq: Three times a day (TID) | ORAL | Status: DC
  Administered 2019-03-02 (×2): 10 mg via ORAL
  Filled 2019-03-01 (×3): qty 1

## 2019-03-01 MED ORDER — ATORVASTATIN CALCIUM 20 MG PO TABS
40.0000 mg | ORAL_TABLET | Freq: Every day | ORAL | Status: DC
  Administered 2019-03-02: 40 mg via ORAL
  Filled 2019-03-01: qty 2

## 2019-03-01 MED ORDER — ONDANSETRON HCL 4 MG/2ML IJ SOLN
4.0000 mg | Freq: Once | INTRAMUSCULAR | Status: AC
  Administered 2019-03-01: 20:00:00 4 mg via INTRAVENOUS
  Filled 2019-03-01: qty 2

## 2019-03-01 MED ORDER — ONDANSETRON HCL 4 MG PO TABS: 4.0000 mg | ORAL_TABLET | Freq: Four times a day (QID) | ORAL | Status: DC | PRN

## 2019-03-01 MED ORDER — ONDANSETRON HCL 4 MG/2ML IJ SOLN: 4.0000 mg | Freq: Four times a day (QID) | INTRAMUSCULAR | Status: DC | PRN

## 2019-03-01 MED ORDER — INSULIN ASPART 100 UNIT/ML ~~LOC~~ SOLN: 0.0000 [IU] | Freq: Every day | SUBCUTANEOUS | Status: DC

## 2019-03-01 MED ORDER — TIOTROPIUM BROMIDE MONOHYDRATE 18 MCG IN CAPS
18.0000 ug | ORAL_CAPSULE | Freq: Every day | RESPIRATORY_TRACT | Status: DC
  Administered 2019-03-02: 18 ug via RESPIRATORY_TRACT
  Filled 2019-03-01: qty 5

## 2019-03-01 MED ORDER — PANTOPRAZOLE SODIUM 40 MG PO TBEC
40.0000 mg | DELAYED_RELEASE_TABLET | Freq: Every day | ORAL | Status: DC
  Administered 2019-03-02: 10:00:00 40 mg via ORAL
  Filled 2019-03-01: qty 1

## 2019-03-01 MED ORDER — FENTANYL CITRATE (PF) 100 MCG/2ML IJ SOLN
25.0000 ug | Freq: Once | INTRAMUSCULAR | Status: AC
  Administered 2019-03-01: 25 ug via INTRAVENOUS
  Filled 2019-03-01: qty 2

## 2019-03-01 MED ORDER — ACETAMINOPHEN 650 MG RE SUPP: 650.0000 mg | Freq: Four times a day (QID) | RECTAL | Status: DC | PRN

## 2019-03-01 MED ORDER — GUAIFENESIN ER 600 MG PO TB12
600.0000 mg | ORAL_TABLET | Freq: Two times a day (BID) | ORAL | Status: DC
  Administered 2019-03-01 – 2019-03-02 (×2): 600 mg via ORAL
  Filled 2019-03-01 (×2): qty 1

## 2019-03-01 MED ORDER — FENTANYL CITRATE (PF) 100 MCG/2ML IJ SOLN
25.0000 ug | INTRAMUSCULAR | Status: AC | PRN
  Administered 2019-03-01 – 2019-03-02 (×2): 25 ug via INTRAVENOUS
  Filled 2019-03-01 (×2): qty 2

## 2019-03-01 MED ORDER — INSULIN ASPART 100 UNIT/ML ~~LOC~~ SOLN
0.0000 [IU] | Freq: Three times a day (TID) | SUBCUTANEOUS | Status: DC
  Administered 2019-03-02: 17:00:00 2 [IU] via SUBCUTANEOUS
  Administered 2019-03-02 (×2): 3 [IU] via SUBCUTANEOUS
  Filled 2019-03-01 (×3): qty 1

## 2019-03-01 MED ORDER — LETROZOLE 2.5 MG PO TABS
2.5000 mg | ORAL_TABLET | Freq: Every day | ORAL | Status: DC
  Administered 2019-03-02: 10:00:00 2.5 mg via ORAL
  Filled 2019-03-01: qty 1

## 2019-03-01 MED ORDER — ACETAMINOPHEN 325 MG PO TABS: 650.0000 mg | ORAL_TABLET | Freq: Four times a day (QID) | ORAL | Status: DC | PRN

## 2019-03-01 MED ORDER — CARVEDILOL 25 MG PO TABS
25.0000 mg | ORAL_TABLET | Freq: Two times a day (BID) | ORAL | Status: DC
  Administered 2019-03-02 (×2): 25 mg via ORAL
  Filled 2019-03-01 (×2): qty 1

## 2019-03-01 MED ORDER — ENOXAPARIN SODIUM 30 MG/0.3ML ~~LOC~~ SOLN
30.0000 mg | SUBCUTANEOUS | Status: DC
  Administered 2019-03-01: 30 mg via SUBCUTANEOUS
  Filled 2019-03-01: qty 0.3

## 2019-03-01 MED ORDER — HYDROMORPHONE HCL 1 MG/ML IJ SOLN: 0.2000 mg | INTRAMUSCULAR | Status: DC | PRN

## 2019-03-01 NOTE — ED Triage Notes (Addendum)
Pt to ED after tripping and falling on her buttock. Pt reporting hip pain at this time. Pt reports she is able to walk with a walker at home but has a hx of a hip replacement and has difficulty walking for long periods of time. Pain is mostly in right upper leg and buttocks, not hip specifically.

## 2019-03-01 NOTE — ED Provider Notes (Signed)
[facility] Emergency Department Provider Note ____________________________________________  Time seen: 1635  I have reviewed the triage vital signs and the nursing notes.  HISTORY  Chief Complaint  Fall  HPI Anita Wilson is a 73 y.o. female, with the below listed PMH, presents to the ED from home via EMS, following mechanical fall.  Patient is 3 years status post a closed fracture of the neck of the right femur with a partial hemiarthroplasty.  She had been stable and well until May of this year, when she took a mechanical fall at home, and sustained a nondisplaced proximal femur fracture on the right.  Her nonsurgical hip has been under the care of Dr. Roland Rack  and she has been in physical therapy in the interim.  Today she reports a mechanical fall where she slipped and fell backwards onto her buttocks.  She reports pain to the right hip at that time and has been able to use a walker at home but is had difficulty walking for long periods of time she describes pain primarily to the right upper leg and buttocks at this time.  She denies any specific anterior hip or groin pain.  She also denies any head injury, loss of consciousness, chest pain, nausea, vomiting, or dizziness.  Past Medical History:  Diagnosis Date  . A-fib ([place])   . Allergy   . Breast cancer ([place])   . CAD (coronary artery disease)   . Chronic diastolic heart failure ([place])   . CKD (chronic kidney disease), stage III ([place])   . COPD (chronic obstructive pulmonary disease) ([place])   . Depression   . Diabetes mellitus without complication ([place])   . Hypertension   . Pulmonary HTN [facility])     Patient Active Problem List   Diagnosis Date Noted  . Pelvic fracture ([place]) 03/01/2019  . Hip fracture ([place]) 01/03/2019  . 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 Fracture of femoral neck, right ([place]) 07/10/2015  . Type 2 diabetes mellitus ([place]) 07/10/2015  . GERD (gastroesophageal reflux disease) 07/10/2015  .  COPD (chronic obstructive pulmonary disease) ([place]) 07/10/2015  . CAD (coronary artery disease) 07/10/2015  . HTN (hypertension) 07/10/2015  . Chronic diastolic CHF (congestive heart failure) ([place]) 07/10/2015  . CKD (chronic kidney disease), stage III ([place]) 07/10/2015  . PAF (paroxysmal atrial fibrillation) ([place]) 07/10/2015    Past Surgical History:  Procedure Laterality Date  . APPENDECTOMY    . BREAST LUMPECTOMY WITH AXILLARY LYMPH NODE DISSECTION    . CORONARY ARTERY BYPASS GRAFT    . HIP ARTHROPLASTY Right 07/25/2015   Procedure: ARTHROPLASTY BIPOLAR HIP (HEMIARTHROPLASTY);  Surgeon: Corky Mull, MD;  Location: [facility] ORS;  Service: Orthopedics;  Laterality: Right;  . HIP PINNING,CANNULATED Right 07/10/2015   Procedure: CANNULATED HIP PINNING;  Surgeon: Corky Mull, MD;  Location: [facility] ORS;  Service: Orthopedics;  Laterality: Right;  right  . MASTECTOMY    . PARATHYROIDECTOMY      Prior to Admission medications   Medication Sig Start Date End Date Taking? Authorizing Provider  acetaminophen (TYLENOL) 325 MG tablet Take 650 mg by mouth every 4 (four) hours as needed for mild pain.    [provider]  aspirin EC 81 MG tablet Take 81 mg by mouth daily.    [provider]  atorvastatin (LIPITOR) 40 MG tablet Take 40 mg by mouth daily.    [provider]  carvedilol (COREG) 25 MG tablet Take 25 mg by mouth 2 (two) times daily.  [provider]  cholecalciferol (VITAMIN D) 1000 UNITS tablet Take 2,000 Units by mouth daily.     [provider]  citalopram (CELEXA) 10 MG tablet Take 10 mg by mouth daily.    [provider]  cloNIDine (CATAPRES) 0.1 MG tablet TAKE 1 TABLET BY MOUTH  NIGHTLY 05/31/18   [provider]  docusate sodium (COLACE) 100 MG capsule Take 1 capsule (100 mg total) by mouth 2 (two) times daily. 01/06/19   Vaughan Basta, MD  enoxaparin (LOVENOX) 30 MG/0.3ML injection Inject 0.3 mLs (30 mg total) into  the skin daily for 14 days. 01/06/19 01/20/19  Vaughan Basta, MD  ferrous sulfate 325 (65 FE) MG tablet Take 1 tablet (325 mg total) by mouth 3 (three) times daily after meals. Patient taking differently: Take 325 mg by mouth daily.  07/12/15   Aldean Jewett, MD  glipiZIDE (GLUCOTROL) 10 MG tablet Take 10 mg by mouth 2 (two) times daily.    [provider]  hydrALAZINE (APRESOLINE) 10 MG tablet Take 10 mg by mouth 3 (three) times daily.     [provider]  HYDROcodone-acetaminophen (NORCO/VICODIN) 5-325 MG tablet Take 1 tablet by mouth every 4 (four) hours as needed for moderate pain or severe pain. 01/06/19   Vaughan Basta, MD  insulin glargine (LANTUS) 100 UNIT/ML injection Inject 0.1 mLs (10 Units total) into the skin at bedtime. Patient taking differently: Inject 12-16 Units into the skin at bedtime.  07/13/15   Aldean Jewett, MD  isosorbide mononitrate (IMDUR) 30 MG 24 hr tablet Take 30 mg by mouth daily.    [provider]  letrozole (FEMARA) 2.5 MG tablet Take 2.5 mg by mouth daily.    [provider]  omeprazole (PRILOSEC) 20 MG capsule Take 20 mg by mouth daily.     [provider]  polyethylene glycol (MIRALAX / GLYCOLAX) 17 g packet Take 17 g by mouth daily as needed for mild constipation. 01/06/19   Vaughan Basta, MD  tiotropium (SPIRIVA) 18 MCG inhalation capsule Place 18 mcg into inhaler and inhale daily.    [provider]    Allergies Bee venom, Benzodiazepines, Diphenhydramine, Morphine, Phenylalanine, and Sulfa antibiotics  Family History  Problem Relation Age of Onset  . Hypertension Mother   . Diabetes Mother   . Heart disease Father   . Diabetes Father   . Diabetes Sister   . Breast cancer Sister   . Dementia Sister     Social History Social History   Tobacco Use  . Smoking status: Current Some Day Smoker    Types: Cigarettes  . Smokeless tobacco: Never Used  Substance Use  Topics  . Alcohol use: Yes    Comment: Occassionally  . Drug use: No    Review of Systems  Constitutional: Negative for fever. Eyes: Negative for visual changes. ENT: Negative for sore throat. Cardiovascular: Negative for chest pain. Respiratory: Negative for shortness of breath. Gastrointestinal: Negative for abdominal pain, vomiting and diarrhea. Genitourinary: Negative for dysuria. Musculoskeletal: Negative for back pain.  Reports right hip and buttock pain Skin: Negative for rash. Neurological: Negative for headaches, focal weakness or numbness. ____________________________________________  PHYSICAL EXAM:  VITAL SIGNS: ED Triage Vitals  Enc Vitals Group     BP 03/01/19 1606 (!) 151/86     Pulse Rate 03/01/19 1606 86     Resp 03/01/19 1606 16     Temp 03/01/19 1606 99.2 F (37.3 C)     Temp Source 03/01/19 1606  Oral     SpO2 03/01/19 1606 99 %     Weight 03/01/19 1601 106 lb (48.1 kg)     Height 03/01/19 1601 5\' 4"  (1.626 m)     Head Circumference --      Peak Flow --      Pain Score 03/01/19 1601 10     Pain Loc --      Pain Edu? --      Excl. in [place]? --     Constitutional: Alert and oriented. Well appearing and in no distress. Head: Normocephalic and atraumatic. Eyes: Conjunctivae are normal. PERRL. Normal extraocular movements Neck: Supple. No thyromegaly. Cardiovascular: Normal rate, regular rhythm. Normal distal pulses. Respiratory: Normal respiratory effort. No wheezes/rales/rhonchi. Gastrointestinal: Flat, soft and nontender. No distention. Musculoskeletal: Patient resting in the supine position with hip and knees flexed.  She is able to demonstrate bilateral hip extension with a hip thrust on exam, and attempt to get on the bedpan.  Nontender with normal range of motion in all extremities.  Neurologic:  Normal normal gross sensation distally. Normal speech and language. No gross focal neurologic deficits are appreciated. Skin:  Skin is warm, dry and  intact. No rash noted. Psychiatric: Mood and affect are normal. Patient exhibits appropriate insight and judgment. ____________________________________________   LABS (pertinent positives/negatives) Labs Reviewed  BASIC METABOLIC PANEL - Abnormal; Notable for the following components:      Result Value   CO2 21 (*)    Glucose, Bld 144 (*)    BUN 42 (*)    Creatinine, Ser 1.54 (*)    Calcium 8.4 (*)    GFR calc non Af Amer 33 (*)    GFR calc Af Amer 39 (*)    All other components within normal limits  CBC WITH DIFFERENTIAL/PLATELET - Abnormal; Notable for the following components:   RBC 3.28 (*)    Hemoglobin 10.5 (*)    HCT 32.7 (*)    Platelets 120 (*)    Neutro Abs 7.8 (*)    All other components within normal limits  SARS CORONAVIRUS 2 (HOSPITAL ORDER, [facility])  URINALYSIS, COMPLETE (UACMP) WITH MICROSCOPIC  ____________________________________________   RADIOLOGY  DG Pelvis w/ Right Hip  IMPRESSION: 1. Subtle nondisplaced, non comminuted fracture of the right ilium. 2. No other fractures. No evidence of disruption/loosening or malalignment of the right hip arthroplasty.  DG Right Femur IMPRESSION: 1. Acute nondisplaced fracture of the right ilium. 2. Subacute fracture of the right inferior pubic ramus, new since May. 3. 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  CXR 1 V  Negative ____________________________________________  PROCEDURES  Procedures Fentanyl 25 mcg IVP x 1  Zofran 4 mg IVP ____________________________________________  INITIAL IMPRESSION / ASSESSMENT AND PLAN / ED COURSE  ----------------------------------------- 6:29 PM on 03/01/2019 ----------------------------------------- S/w Dr. Mack Guise: he reviewed the images and confirms the fractures are non-surgical. He suggest admission to the hospital service for pain control and placement to SNF.  Lalisa Kiehn  Notaro was evaluated in Emergency Department on 03/01/2019 for the symptoms described in the history of present illness. She was evaluated in the context of the global COVID-19 pandemic, which necessitated consideration that the patient might be at risk for infection with the SARS-CoV-2 virus that causes COVID-19. Institutional protocols and algorithms that pertain to the evaluation of patients at risk for COVID-19 are in a state of rapid change based on information released by regulatory bodies including the CDC and  federal and state organizations. These policies and algorithms were followed during the patient's care in the ED.  Patient with ED evaluation of hip/pelvic pain s/p mechanical fall. She has stable fractures of the pelvis. She is agreeable to admission and subsequent placement.  ____________________________________________  FINAL CLINICAL IMPRESSION(S) / ED DIAGNOSES  Final diagnoses:  Multiple closed fractures of pelvis without disruption of pelvic ring, initial encounter [facility])      Aaima Gaddie, Dannielle Karvonen, PA-C 03/01/19 2134    Duffy Bruce, MD 03/05/19 0352    Duffy Bruce, MD 03/05/19 [phone]

## 2019-03-01 NOTE — Consult Note (Signed)
Called by Lillia Mountain, PA in the ED regarding Ms. Mohamud who presents to the ED today after a fall.  The patient has a right hip hemiarthroplasty performed by Dr. Elenore Rota in 2016.  She sustained another fall back on 01/03/19 and was seen by Dr. Leim Fabry.  CT of the hip at that time showed a minimally displaced periprosthetic fracture of the right hip.  She was treated nonoperatively.  Today's x-rays demonstrate an acute fracture of the ileum which is nondisplaced, a subacute fracture of the right inferior pubic ramus which is new since May according to the radiologist.  Patient also has healing subacute to chronic periprosthetic fracture which is consistent with her radiologic findings back in May.  I recommend the patient be toe-touch weightbearing on the right lower extremity and use a walker for assistance with ambulation.  If the patient is discharged from the hospital she can follow-up with Dr. Roland Rack or Dr. Posey Pronto in 1 to 2 weeks.  If the patient is unable to be discharged home she can be admitted to the hospital service for placement in a skilled nursing facility.  I do not foresee operative intervention will be necessary for any of her fractures.

## 2019-03-01 NOTE — Progress Notes (Signed)
PHARMACIST - PHYSICIAN COMMUNICATION  CONCERNING:  Enoxaparin (Lovenox) for DVT Prophylaxis   RECOMMENDATION: Patient was prescribed enoxaprin 40mg  q24 hours for VTE prophylaxis.   Filed Weights   03/01/19 1601  Weight: 106 lb (48.1 kg)    Body mass index is 18.19 kg/m.  Estimated Creatinine Clearance: 25.1 mL/min (A) (by C-G formula based on SCr of 1.54 mg/dL (H)).  Patient is candidate for enoxaparin 30mg  every 24 hours based on CrCl <70ml/min   DESCRIPTION: Pharmacy has adjusted enoxaparin dose.  Patient is now receiving enoxaparin 30mg  every 24 hours.  Pernell Dupre, PharmD, BCPS Clinical Pharmacist 03/01/2019 11:31 PM

## 2019-03-01 NOTE — H&P (Signed)
[facility] NAME: Anita Wilson    MR#:  [id number]  DATE OF BIRTH:  1945/10/26  DATE OF ADMISSION:  03/01/2019  PRIMARY CARE PHYSICIAN: Alessandra Grout, MD   REQUESTING/REFERRING PHYSICIAN: Carmie End, MD  CHIEF COMPLAINT:   Chief Complaint  Patient presents with  . Fall    HISTORY OF PRESENT ILLNESS:  Anita Wilson  is a 73 y.o. female who presents with chief complaint as above.  Patient presents the ED with mechanical fall.  She has had recent leg fractures, and has been following with PT and OT as well as orthopedic surgery for recovery.  She states that she was just about finished with her therapy and had been feeling very good and started walking without her walker.  She had an accidental mechanical fall tonight, and suffered subsequent pelvic fractures.  Orthopedic surgeon contacted by ED physician and states that she does not require surgical repair of these fractures.  Hospitalist called for admission  PAST MEDICAL HISTORY:   Past Medical History:  Diagnosis Date  . A-fib ([place])   . Allergy   . Breast cancer ([place])   . CAD (coronary artery disease)   . Chronic diastolic heart failure ([place])   . CKD (chronic kidney disease), stage III ([place])   . COPD (chronic obstructive pulmonary disease) ([place])   . Depression   . Diabetes mellitus without complication ([place])   . Hypertension   . Pulmonary HTN ([place])      PAST SURGICAL HISTORY:   Past Surgical History:  Procedure Laterality Date  . APPENDECTOMY    . BREAST LUMPECTOMY WITH AXILLARY LYMPH NODE DISSECTION    . CORONARY ARTERY BYPASS GRAFT    . HIP ARTHROPLASTY Right 07/25/2015   Procedure: ARTHROPLASTY BIPOLAR HIP (HEMIARTHROPLASTY);  Surgeon: Corky Mull, MD;  Location: [facility] ORS;  Service: Orthopedics;  Laterality: Right;  . HIP PINNING,CANNULATED Right 07/10/2015   Procedure: CANNULATED HIP PINNING;  Surgeon: Corky Mull, MD;  Location: [facility] ORS;   Service: Orthopedics;  Laterality: Right;  right  . MASTECTOMY    . PARATHYROIDECTOMY       SOCIAL HISTORY:   Social History   Tobacco Use  . Smoking status: Current Some Day Smoker    Types: Cigarettes  . Smokeless tobacco: Never Used  Substance Use Topics  . Alcohol use: Yes    Comment: Occassionally     FAMILY HISTORY:   Family History  Problem Relation Age of Onset  . Hypertension Mother   . Diabetes Mother   . Heart disease Father   . Diabetes Father   . Diabetes Sister   . Breast cancer Sister   . Dementia Sister      DRUG ALLERGIES:   Allergies  Allergen Reactions  . Bee Venom Shortness Of Breath and Swelling  . Benzodiazepines     Other reaction(s): Confusion  . Diphenhydramine Other (See Comments)    Took tylenol pm and end up in the hospital something wrong with my heart  . Morphine Other (See Comments)    Reaction:  Hallucinations   . Phenylalanine Diarrhea and Nausea And Vomiting  . Sulfa Antibiotics Other (See Comments)    metallic smell and taste    MEDICATIONS AT HOME:   Prior to Admission medications   Medication Sig Start Date End Date Taking? Authorizing Provider  acetaminophen (TYLENOL) 325 MG tablet Take 650 mg by mouth every 4 (four) hours as needed for mild pain.  [provider]  aspirin EC 81 MG tablet Take 81 mg by mouth daily.    [provider]  atorvastatin (LIPITOR) 40 MG tablet Take 40 mg by mouth daily.    [provider]  carvedilol (COREG) 25 MG tablet Take 25 mg by mouth 2 (two) times daily.    [provider]  cholecalciferol (VITAMIN D) 1000 UNITS tablet Take 2,000 Units by mouth daily.     [provider]  citalopram (CELEXA) 10 MG tablet Take 10 mg by mouth daily.    [provider]  cloNIDine (CATAPRES) 0.1 MG tablet TAKE 1 TABLET BY MOUTH  NIGHTLY 05/31/18   [provider]  docusate sodium (COLACE) 100 MG capsule Take 1 capsule (100 mg total) by mouth 2  (two) times daily. 01/06/19   Vaughan Basta, MD  enoxaparin (LOVENOX) 30 MG/0.3ML injection Inject 0.3 mLs (30 mg total) into the skin daily for 14 days. 01/06/19 01/20/19  Vaughan Basta, MD  ferrous sulfate 325 (65 FE) MG tablet Take 1 tablet (325 mg total) by mouth 3 (three) times daily after meals. Patient taking differently: Take 325 mg by mouth daily.  07/12/15   Aldean Jewett, MD  glipiZIDE (GLUCOTROL) 10 MG tablet Take 10 mg by mouth 2 (two) times daily.    [provider]  hydrALAZINE (APRESOLINE) 10 MG tablet Take 10 mg by mouth 3 (three) times daily.     [provider]  HYDROcodone-acetaminophen (NORCO/VICODIN) 5-325 MG tablet Take 1 tablet by mouth every 4 (four) hours as needed for moderate pain or severe pain. 01/06/19   Vaughan Basta, MD  insulin glargine (LANTUS) 100 UNIT/ML injection Inject 0.1 mLs (10 Units total) into the skin at bedtime. Patient taking differently: Inject 12-16 Units into the skin at bedtime.  07/13/15   Aldean Jewett, MD  isosorbide mononitrate (IMDUR) 30 MG 24 hr tablet Take 30 mg by mouth daily.    [provider]  letrozole (FEMARA) 2.5 MG tablet Take 2.5 mg by mouth daily.    [provider]  omeprazole (PRILOSEC) 20 MG capsule Take 20 mg by mouth daily.     [provider]  polyethylene glycol (MIRALAX / GLYCOLAX) 17 g packet Take 17 g by mouth daily as needed for mild constipation. 01/06/19   Vaughan Basta, MD  tiotropium (SPIRIVA) 18 MCG inhalation capsule Place 18 mcg into inhaler and inhale daily.    [provider]    REVIEW OF SYSTEMS:  Review of Systems  Constitutional: Negative for chills, fever, malaise/fatigue and weight loss.  HENT: Negative for ear pain, hearing loss and tinnitus.   Eyes: Negative for blurred vision, double vision, pain and redness.  Respiratory: Negative for cough, hemoptysis and shortness of breath.   Cardiovascular: Negative  for chest pain, palpitations, orthopnea and leg swelling.  Gastrointestinal: Negative for abdominal pain, constipation, diarrhea, nausea and vomiting.  Genitourinary: Negative for dysuria, frequency and hematuria.  Musculoskeletal: Positive for falls. Negative for back pain, joint pain and neck pain.       Pelvic pain  Skin:       No acne, rash, or lesions  Neurological: Negative for dizziness, tremors, focal weakness and weakness.  Endo/Heme/Allergies: Negative for polydipsia. Does not bruise/bleed easily.  Psychiatric/Behavioral: Negative for depression. The patient is not nervous/anxious and does not have insomnia.      VITAL SIGNS:   Vitals:   03/01/19 1601 03/01/19 1606 03/01/19 1848 03/01/19 1850  BP:  (!) 151/86 Marland Kitchen)  166/65   Pulse:  86 89   Resp:  16 18   Temp:  99.2 F (37.3 C)    TempSrc:  Oral    SpO2:  99% 90% 95%  Weight: 48.1 kg     Height: 5\' 4"  (1.626 m)      Wt Readings from Last 3 Encounters:  03/01/19 48.1 kg  01/06/19 45.8 kg  07/27/15 55.9 kg    PHYSICAL EXAMINATION:  Physical Exam  Vitals reviewed. Constitutional: She is oriented to person, place, and time. She appears well-developed and well-nourished. No distress.  HENT:  Head: Normocephalic and atraumatic.  Mouth/Throat: Oropharynx is clear and moist.  Eyes: Pupils are equal, round, and reactive to light. Conjunctivae and EOM are normal. No scleral icterus.  Neck: Normal range of motion. Neck supple. No JVD present. No thyromegaly present.  Cardiovascular: Normal rate, regular rhythm and intact distal pulses. Exam reveals no gallop and no friction rub.  No murmur heard. Respiratory: Effort normal and breath sounds normal. No respiratory distress. She has no wheezes. She has no rales.  GI: Soft. Bowel sounds are normal. She exhibits no distension. There is no abdominal tenderness.  Musculoskeletal: Normal range of motion.        General: Tenderness (Pelvis) present. No edema.     Comments: No  arthritis, no gout  Lymphadenopathy:    She has no cervical adenopathy.  Neurological: She is alert and oriented to person, place, and time. No cranial nerve deficit.  No dysarthria, no aphasia  Skin: Skin is warm and dry. No rash noted. No erythema.  Psychiatric: She has a normal mood and affect. Her behavior is normal. Judgment and thought content normal.    LABORATORY PANEL:   CBC Recent Labs  Lab 03/01/19 1940  WBC 9.1  HGB 10.5*  HCT 32.7*  PLT 120*   ------------------------------------------------------------------------------------------------------------------  Chemistries  Recent Labs  Lab 03/01/19 1940  NA 140  K 4.6  CL 111  CO2 21*  GLUCOSE 144*  BUN 42*  CREATININE 1.54*  CALCIUM 8.4*   ------------------------------------------------------------------------------------------------------------------  Cardiac Enzymes No results for input(s): TROPONINI in the last 168 hours. ------------------------------------------------------------------------------------------------------------------  RADIOLOGY:  Dg Chest Portable 1 View  Result Date: 03/01/2019 CLINICAL DATA:  Pt comes into the ED via EMS from home after a trip and fall landing on her buttock and having right buttock pain and had a right hip fx 2 months ago. pt stating she cant breath during exam EXAM: PORTABLE CHEST 1 VIEW COMPARISON:  01/03/2019 and older exams. FINDINGS: Cardiac surgery. Cardiac silhouette stable changes from prior is normal in size. No mediastinal or hilar masses. Lungs are hyperexpanded. There is mild interstitial thickening, chronic. There is no evidence of pneumonia or pulmonary edema. No pleural effusion or pneumothorax. Skeletal structures are demineralized but grossly intact. Stable changes from bilateral breast surgery. IMPRESSION: No acute cardiopulmonary disease. Electronically Signed   By: Lajean Manes M.D.   On: 03/01/2019 19:39   Dg Hip Unilat W Or Wo Pelvis 2-3 Views  Right  Result Date: 03/01/2019 CLINICAL DATA:  Patient complains of fall today. Patient states her feet got twisted underneath her and she fell onto right side. Patient complains of mid right femur pain radiating up into her hip. Hx of right hip pinning. EXAM: DG HIP (WITH OR WITHOUT PELVIS) 2-3V RIGHT COMPARISON:  01/03/2019 FINDINGS: There is a subtle nondisplaced fracture of the right ilium. Fracture is non comminuted. No other evidence of an acute fracture. The right hip  arthroplasty appears well seated and aligned and unchanged from the prior study. Skeletal structures are diffusely demineralized. Soft tissues are unremarkable other than surgical vascular clips along the medial right thigh and vascular calcifications. IMPRESSION: 1. Subtle nondisplaced, non comminuted fracture of the right ilium. 2. No other fractures. No evidence of disruption/loosening or malalignment of the right hip arthroplasty. Electronically Signed   By: Lajean Manes M.D.   On: 03/01/2019 17:38   Dg Femur Min 2 Views Right  Result Date: 03/01/2019 CLINICAL DATA:  Fall. EXAM: RIGHT FEMUR 2 VIEWS COMPARISON:  CT right hip and right hip and knee x-rays dated Jan 03, 2019. Right femur x-rays dated July 04, 2015. FINDINGS: New nondisplaced linear lucency through the right ilium. Subacute fracture of the right inferior pubic ramus with surrounding callus formation, new since May. 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 Osteopenia. Vascular calcifications. Multiple surgical clips again noted. IMPRESSION: 1. Acute nondisplaced fracture of the right ilium. 2. Subacute fracture of the right inferior pubic ramus, new since May. 3. 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 Electronically Signed   By: Titus Dubin M.D.   On: 03/01/2019  17:47    EKG:   Orders placed or performed during the hospital encounter of 01/03/19  . ED EKG  . ED EKG  . EKG 12-Lead  . EKG 12-Lead    IMPRESSION AND PLAN:  Principal Problem:   Pelvic fracture ([place]) -orthopedic surgery states no need for surgical intervention, and that she should be toe-touch weightbearing on the right and use a walker with ambulation, and she can follow-up with orthopedics in the clinic at discharge.  PT OT consults Active Problems:   Type 2 diabetes mellitus (HCC) -sliding scale insulin coverage   COPD (chronic obstructive pulmonary disease) (HCC) -continue home meds and inhalers   CAD (coronary artery disease) -continue home medications   HTN (hypertension) -home dose antihypertensives   PAF (paroxysmal atrial fibrillation) (HCC) -home dose rate controlling medications   GERD (gastroesophageal reflux disease) -home dose PPI   CKD (chronic kidney disease), stage III (HCC) -at baseline, avoid nephrotoxins and monitor  Chart review performed and case discussed with ED provider. Labs, imaging and/or ECG reviewed by provider and discussed with patient/family. Management plans discussed with the patient and/or family.  COVID-19 status: Test Pending  DVT PROPHYLAXIS: SubQ lovenox   GI PROPHYLAXIS:  PPI   ADMISSION STATUS: Inpatient     CODE STATUS: Full Code Status History    Date Active Date Inactive Code Status Order ID Comments User Context   01/03/2019 1024 01/06/2019 1753 Full Code [id number]  Gorden Harms, MD Inpatient   07/25/2015 1758 07/29/2015 1930 Full Code [id number]  Corky Mull, MD Inpatient   07/25/2015 0031 07/25/2015 1758 Full Code [id number]  Harrie Foreman, MD Inpatient   07/24/2015 2315 07/25/2015 0031 Full Code [id number]  Hessie Knows, MD ED   07/10/2015 1904 07/13/2015 1650 Full Code [id number]  Corky Mull, MD Inpatient   07/10/2015 0210 07/10/2015 1904 Full Code [id number]  Lance Coon, MD ED   07/10/2015 0021 07/10/2015 0210  Full Code [id number]  Hooten, Laurice Record, MD ED   Advance Care Planning Activity      TOTAL TIME TAKING CARE OF THIS PATIENT: 45 minutes.   This patient was evaluated in the context of the [place] COVID-19  pandemic, which necessitated consideration that the patient might be at risk for infection with the SARS-CoV-2 virus that causes COVID-19. Institutional protocols and algorithms that pertain to the evaluation of patients at risk for COVID-19 are in a state of rapid change based on information released by regulatory bodies including the CDC and federal and state organizations. These policies and algorithms were followed to the best of this provider's knowledge to date during the patient's care at this facility.  Ethlyn Daniels 03/01/2019, 8:59 PM  [facility]  Office  [phone]  CC: Primary care physician; Alessandra Grout, MD  Note:  This document was prepared using Dragon voice recognition software and may include unintentional dictation errors.

## 2019-03-01 NOTE — ED Notes (Signed)
ED TO INPATIENT HANDOFF REPORT  ED Nurse Name and Phone #: Antonieta Pert, RN  S Name/Age/Gender Anita Wilson 73 y.o. female Room/Bed: ED42A/ED42A  Code Status   Code Status: Prior  Home/SNF/Other Home Patient oriented to: self, place, time and situation Is this baseline? Yes   Triage Complete: Triage complete  Chief Complaint EMS Fall  Triage Note Pt comes into the ED via EMS from home after a trip and fall landing on her buttock and having right buttock pain and had a right hip fx 2 months ago.   Pt to ED after tripping and falling on her buttock. Pt reporting hip pain at this time. Pt reports she is able to walk with a walker at home but has a hx of a hip replacement and has difficulty walking for long periods of time. Pain is mostly in right upper leg and buttocks, not hip specifically.    Allergies Allergies  Allergen Reactions  . Bee Venom Shortness Of Breath and Swelling  . Benzodiazepines     Other reaction(s): Confusion  . Diphenhydramine Other (See Comments)    Took tylenol pm and end up in the hospital something wrong with my heart  . Morphine Other (See Comments)    Reaction:  Hallucinations   . Phenylalanine Diarrhea and Nausea And Vomiting  . Sulfa Antibiotics Other (See Comments)    metallic smell and taste    Level of Care/Admitting Diagnosis ED Disposition    None      B Medical/Surgery History Past Medical History:  Diagnosis Date  . A-fib ([place])   . Allergy   . Breast cancer ([place])   . CAD (coronary artery disease)   . Chronic diastolic heart failure ([place])   . CKD (chronic kidney disease), stage III ([place])   . COPD (chronic obstructive pulmonary disease) ([place])   . Depression   . Diabetes mellitus without complication ([place])   . Hypertension   . Pulmonary HTN ([place])    Past Surgical History:  Procedure Laterality Date  . APPENDECTOMY    . BREAST LUMPECTOMY WITH AXILLARY LYMPH NODE DISSECTION    . CORONARY ARTERY BYPASS GRAFT     . HIP ARTHROPLASTY Right 07/25/2015   Procedure: ARTHROPLASTY BIPOLAR HIP (HEMIARTHROPLASTY);  Surgeon: Corky Mull, MD;  Location: [facility] ORS;  Service: Orthopedics;  Laterality: Right;  . HIP PINNING,CANNULATED Right 07/10/2015   Procedure: CANNULATED HIP PINNING;  Surgeon: Corky Mull, MD;  Location: [facility] ORS;  Service: Orthopedics;  Laterality: Right;  right  . MASTECTOMY    . PARATHYROIDECTOMY       A IV Location/Drains/Wounds Patient Lines/Drains/Airways Status   Active Line/Drains/Airways    Name:   Placement date:   Placement time:   Site:   Days:   Peripheral IV 03/01/19 Right Forearm   03/01/19    1844    Forearm   less than 1          Intake/Output Last 24 hours No intake or output data in the 24 hours ending 03/01/19 2039  Labs/Imaging Results for orders placed or performed during the hospital encounter of 03/01/19 (from the past 48 hour(s))  Basic metabolic panel     Status: Abnormal   Collection Time: 03/01/19  7:40 PM  Result Value Ref Range   Sodium 140 135 - 145 mmol/L   Potassium 4.6 3.5 - 5.1 mmol/L   Chloride 111 98 - 111 mmol/L   CO2 21 (L) 22 - 32 mmol/L   Glucose, Bld  144 (H) 70 - 99 mg/dL   BUN 42 (H) 8 - 23 mg/dL   Creatinine, Ser 1.54 (H) 0.44 - 1.00 mg/dL   Calcium 8.4 (L) 8.9 - 10.3 mg/dL   GFR calc non Af Amer 33 (L) >60 mL/min   GFR calc Af Amer 39 (L) >60 mL/min   Anion gap 8 5 - 15    Comment: Performed at [facility], [address] [place], [place] [address]  CBC with Differential     Status: Abnormal   Collection Time: 03/01/19  7:40 PM  Result Value Ref Range   WBC 9.1 4.0 - 10.5 K/uL   RBC 3.28 (L) 3.87 - 5.11 MIL/uL   Hemoglobin 10.5 (L) 12.0 - 15.0 g/dL   HCT 32.7 (L) 36.0 - 46.0 %   MCV 99.7 80.0 - 100.0 fL   MCH 32.0 26.0 - 34.0 pg   MCHC 32.1 30.0 - 36.0 g/dL   RDW 13.4 11.5 - 15.5 %   Platelets 120 (L) 150 - 400 K/uL   nRBC 0.0 0.0 - 0.2 %   Neutrophils Relative % 86 %   Neutro Abs 7.8 (H) 1.7 - 7.7 K/uL    Lymphocytes Relative 8 %   Lymphs Abs 0.7 0.7 - 4.0 K/uL   Monocytes Relative 5 %   Monocytes Absolute 0.5 0.1 - 1.0 K/uL   Eosinophils Relative 0 %   Eosinophils Absolute 0.0 0.0 - 0.5 K/uL   Basophils Relative 0 %   Basophils Absolute 0.0 0.0 - 0.1 K/uL   Immature Granulocytes 1 %   Abs Immature Granulocytes 0.06 0.00 - 0.07 K/uL    Comment: Performed at [facility], [address] [place], [place] [address]   Dg Chest Portable 1 View  Result Date: 03/01/2019 CLINICAL DATA:  Pt comes into the ED via EMS from home after a trip and fall landing on her buttock and having right buttock pain and had a right hip fx 2 months ago. pt stating she cant breath during exam EXAM: PORTABLE CHEST 1 VIEW COMPARISON:  01/03/2019 and older exams. FINDINGS: Cardiac surgery. Cardiac silhouette stable changes from prior is normal in size. No mediastinal or hilar masses. Lungs are hyperexpanded. There is mild interstitial thickening, chronic. There is no evidence of pneumonia or pulmonary edema. No pleural effusion or pneumothorax. Skeletal structures are demineralized but grossly intact. Stable changes from bilateral breast surgery. IMPRESSION: No acute cardiopulmonary disease. Electronically Signed   By: Lajean Manes M.D.   On: 03/01/2019 19:39   Dg Hip Unilat W Or Wo Pelvis 2-3 Views Right  Result Date: 03/01/2019 CLINICAL DATA:  Patient complains of fall today. Patient states her feet got twisted underneath her and she fell onto right side. Patient complains of mid right femur pain radiating up into her hip. Hx of right hip pinning. EXAM: DG HIP (WITH OR WITHOUT PELVIS) 2-3V RIGHT COMPARISON:  01/03/2019 FINDINGS: There is a subtle nondisplaced fracture of the right ilium. Fracture is non comminuted. No other evidence of an acute fracture. The right hip arthroplasty appears well seated and aligned and unchanged from the prior study. Skeletal structures are diffusely demineralized. Soft tissues are  unremarkable other than surgical vascular clips along the medial right thigh and vascular calcifications. IMPRESSION: 1. Subtle nondisplaced, non comminuted fracture of the right ilium. 2. No other fractures. No evidence of disruption/loosening or malalignment of the right hip arthroplasty. Electronically Signed   By: Lajean Manes M.D.   On: 03/01/2019 17:38  Dg Femur Min 2 Views Right  Result Date: 03/01/2019 CLINICAL DATA:  Fall. EXAM: RIGHT FEMUR 2 VIEWS COMPARISON:  CT right hip and right hip and knee x-rays dated Jan 03, 2019. Right femur x-rays dated July 04, 2015. FINDINGS: New nondisplaced linear lucency through the right ilium. Subacute fracture of the right inferior pubic ramus with surrounding callus formation, new since May. 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 Osteopenia. Vascular calcifications. Multiple surgical clips again noted. IMPRESSION: 1. Acute nondisplaced fracture of the right ilium. 2. Subacute fracture of the right inferior pubic ramus, new since May. 3. 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 Electronically Signed   By: Titus Dubin M.D.   On: 03/01/2019 17:47    Pending Labs Unresulted Labs (From admission, onward)    Start     Ordered   03/01/19 2014  Urinalysis, Complete w Microscopic  ONCE - STAT,   STAT     03/01/19 2013   03/01/19 1918  SARS Coronavirus 2 (CEPHEID - Performed in [facility] hospital lab), Hosp Order  (Asymptomatic Patients Labs)  Once,   STAT    Question:  Rule Out  Answer:  Yes   03/01/19 1917          Vitals/Pain Today's Vitals   03/01/19 1848 03/01/19 1850 03/01/19 1937 03/01/19 2030  BP: (!) 166/65     Pulse: 89     Resp: 18     Temp:      TempSrc:      SpO2: 90% 95%    Weight:      Height:      PainSc:   5  5      Isolation Precautions No active isolations  Medications Medications  fentaNYL (SUBLIMAZE) injection 25 mcg (25 mcg Intravenous Given 03/01/19 1932)  ondansetron (ZOFRAN) injection 4 mg (4 mg Intravenous Given 03/01/19 1932)    Mobility walks with device Moderate fall risk   Focused Assessments Pt has palpable pulses distal to injury that are 2+. no swelling to RLE noted. No change in sensation. Decreased mobility. Positioning has helped w/ pain.   R Recommendations: See Admitting Provider Note  Report given to:   Additional Notes: Pt lives w/ daughter. Pt uses a walker but was recently told she could "walk some without it."

## 2019-03-01 NOTE — ED Triage Notes (Signed)
Pt comes into the ED via EMS from home after a trip and fall landing on her buttock and having right buttock pain and had a right hip fx 2 months ago.

## 2019-03-01 NOTE — Care Management (Signed)
RNCM notified by Patient Anita Wilson that patient is open to [facility] home health. Message sent to [facility] with [facility].to check patient status.

## 2019-03-02 ENCOUNTER — Inpatient Hospital Stay: Payer: Medicare Other

## 2019-03-02 ENCOUNTER — Other Ambulatory Visit: Payer: Self-pay

## 2019-03-02 LAB — URINALYSIS, COMPLETE (UACMP) WITH MICROSCOPIC
Bilirubin Urine: NEGATIVE
Glucose, UA: NEGATIVE mg/dL
Hgb urine dipstick: NEGATIVE
Ketones, ur: NEGATIVE mg/dL
Nitrite: NEGATIVE
Protein, ur: 100 mg/dL — AB
Specific Gravity, Urine: 1.016 (ref 1.005–1.030)
pH: 5 (ref 5.0–8.0)

## 2019-03-02 LAB — BASIC METABOLIC PANEL
Anion gap: 7 (ref 5–15)
BUN: 42 mg/dL — ABNORMAL HIGH (ref 8–23)
CO2: 22 mmol/L (ref 22–32)
Calcium: 8.2 mg/dL — ABNORMAL LOW (ref 8.9–10.3)
Chloride: 110 mmol/L (ref 98–111)
Creatinine, Ser: 1.69 mg/dL — ABNORMAL HIGH (ref 0.44–1.00)
GFR calc Af Amer: 35 mL/min — ABNORMAL LOW (ref >60)
GFR calc non Af Amer: 30 mL/min — ABNORMAL LOW (ref >60)
Glucose, Bld: 237 mg/dL — ABNORMAL HIGH (ref 70–99)
Potassium: 5.3 mmol/L — ABNORMAL HIGH (ref 3.5–5.1)
Sodium: 139 mmol/L (ref 135–145)

## 2019-03-02 LAB — CBC
HCT: 33 % — ABNORMAL LOW (ref 36.0–46.0)
Hemoglobin: 10.6 g/dL — ABNORMAL LOW (ref 12.0–15.0)
MCH: 32 pg (ref 26.0–34.0)
MCHC: 32.1 g/dL (ref 30.0–36.0)
MCV: 99.7 fL (ref 80.0–100.0)
Platelets: 129 10*3/uL — ABNORMAL LOW (ref 150–400)
RBC: 3.31 MIL/uL — ABNORMAL LOW (ref 3.87–5.11)
RDW: 13.6 % (ref 11.5–15.5)
WBC: 8.5 10*3/uL (ref 4.0–10.5)
nRBC: 0 % (ref 0.0–0.2)

## 2019-03-02 LAB — GLUCOSE, CAPILLARY
Glucose-Capillary: 208 mg/dL — ABNORMAL HIGH (ref 70–99)
Glucose-Capillary: 214 mg/dL — ABNORMAL HIGH (ref 70–99)

## 2019-03-02 MED ORDER — INSULIN GLARGINE 100 UNIT/ML ~~LOC~~ SOLN: 10.0000 [IU] | Freq: Every day | SUBCUTANEOUS | 11 refills | Status: AC

## 2019-03-02 MED ORDER — FERROUS SULFATE 325 (65 FE) MG PO TABS: 325.0000 mg | ORAL_TABLET | Freq: Every day | ORAL | Status: AC

## 2019-03-02 MED ORDER — HYDROCODONE-ACETAMINOPHEN 5-325 MG PO TABS: 1.0000 | ORAL_TABLET | ORAL | 0 refills | Status: AC | PRN

## 2019-03-02 MED ORDER — HYDROMORPHONE HCL 1 MG/ML IJ SOLN
0.5000 mg | INTRAMUSCULAR | Status: DC | PRN
  Administered 2019-03-02 (×2): 0.5 mg via INTRAVENOUS
  Filled 2019-03-02 (×2): qty 1

## 2019-03-02 NOTE — NC FL2 (Signed)
[place] LEVEL OF CARE SCREENING TOOL     IDENTIFICATION  Patient Name: Anita Wilson [id number] Birthdate: Dec 13, 1945 Sex: female Admission Date (Current Location): 03/01/2019  [place] and [place] Number:  Engineering geologist and Address:  [facility], [address], [place], [place] [address]      Provider Number: [id number]  Attending Physician Name and Address:  Bettey Costa, MD  Relative Name and Phone Number:       Current Level of Care: Hospital Recommended Level of Care: Black Prior Approval Number:    Date Approved/Denied:   PASRR Number: [id number]  Discharge Plan: SNF    Current Diagnoses: Patient Active Problem List   Diagnosis Date Noted  . Pelvic fracture ([place]) 03/01/2019  . Hip fracture ([place]) 01/03/2019  . Hip subluxation ([place]) 07/24/2015  . Fracture of femoral neck, right ([place]) 07/10/2015  . Type 2 diabetes mellitus ([place]) 07/10/2015  . GERD (gastroesophageal reflux disease) 07/10/2015  . COPD (chronic obstructive pulmonary disease) ([place]) 07/10/2015  . CAD (coronary artery disease) 07/10/2015  . HTN (hypertension) 07/10/2015  . Chronic diastolic CHF (congestive heart failure) ([place]) 07/10/2015  . CKD (chronic kidney disease), stage III ([place]) 07/10/2015  . PAF (paroxysmal atrial fibrillation) ([place]) 07/10/2015    Orientation RESPIRATION BLADDER Height & Weight     Self, Time, Situation, Place  O2(2 Liters Oxygen.) Incontinent Weight: 106 lb (48.1 kg) Height:  5\' 4"  (162.6 cm)  BEHAVIORAL SYMPTOMS/MOOD NEUROLOGICAL BOWEL NUTRITION STATUS      Incontinent Diet(Diet: Heart Healthy/ Carb Modified.)  AMBULATORY STATUS COMMUNICATION OF NEEDS Skin   Extensive Assist Verbally PU Stage and Appropriate Care(Pressure Ulcer Stage 2 on Right Great Toe.)                       Personal Care Assistance Level of Assistance  Bathing, Feeding, Dressing Bathing Assistance: Limited assistance Feeding  assistance: Independent Dressing Assistance: Limited assistance     Functional Limitations Info  Sight, Hearing, Speech Sight Info: Adequate Hearing Info: Adequate Speech Info: Adequate    SPECIAL CARE FACTORS FREQUENCY  PT (By licensed PT), OT (By licensed OT)     PT Frequency: 5 OT Frequency: 5            Contractures      Additional Factors Info  Code Status, Allergies Code Status Info: Full Code. Allergies Info: Bee Venom, Benzodiazepines,Diphenhydramine, Morphine, Phenylalanine, Sulfa Antibiotics           Current Medications (03/02/2019):  This is the current hospital active medication list Current Facility-Administered Medications  Medication Dose Route Frequency Provider Last Rate Last Dose  . acetaminophen (TYLENOL) tablet 650 mg  650 mg Oral Q6H PRN Lance Coon, MD       Or  . acetaminophen (TYLENOL) suppository 650 mg  650 mg Rectal Q6H PRN Lance Coon, MD      . aspirin EC tablet 81 mg  81 mg Oral Daily Lance Coon, MD   81 mg at 03/02/19 1013  . atorvastatin (LIPITOR) tablet 40 mg  40 mg Oral Daily Lance Coon, MD   40 mg at 03/02/19 1013  . carvedilol (COREG) tablet 25 mg  25 mg Oral BID Lance Coon, MD   25 mg at 03/02/19 1014  . citalopram (CELEXA) tablet 10 mg  10 mg Oral Daily Lance Coon, MD   10 mg at 03/02/19 1013  . cloNIDine (CATAPRES) tablet 0.1 mg  0.1 mg Oral QHS Lance Coon,  MD   0.1 mg at 03/02/19 0004  . enoxaparin (LOVENOX) injection 30 mg  30 mg Subcutaneous Q24H Lance Coon, MD   30 mg at 03/01/19 2357  . guaiFENesin (MUCINEX) 12 hr tablet 600 mg  600 mg Oral BID Lance Coon, MD   600 mg at 03/02/19 1013  . hydrALAZINE (APRESOLINE) tablet 10 mg  10 mg Oral TID Lance Coon, MD   10 mg at 03/02/19 1024  . HYDROmorphone (DILAUDID) injection 0.5 mg  0.5 mg Intravenous Q4H PRN Harrie Foreman, MD   0.5 mg at 03/02/19 8590  . insulin aspart (novoLOG) injection 0-5 Units  0-5 Units Subcutaneous QHS Lance Coon, MD      .  insulin aspart (novoLOG) injection 0-9 Units  0-9 Units Subcutaneous TID WC Lance Coon, MD   3 Units at 03/02/19 [phone]  . isosorbide mononitrate (IMDUR) 24 hr tablet 30 mg  30 mg Oral Daily Lance Coon, MD   30 mg at 03/02/19 1014  . letrozole [facility]) tablet 2.5 mg  2.5 mg Oral Daily Lance Coon, MD   2.5 mg at 03/02/19 1014  . ondansetron (ZOFRAN) tablet 4 mg  4 mg Oral Q6H PRN Lance Coon, MD       Or  . ondansetron [facility]) injection 4 mg  4 mg Intravenous Q6H PRN Lance Coon, MD      . oxyCODONE (Oxy IR/ROXICODONE) immediate release tablet 5 mg  5 mg Oral Q4H PRN Lance Coon, MD   5 mg at 03/02/19 0412  . pantoprazole (PROTONIX) EC tablet 40 mg  40 mg Oral Daily Lance Coon, MD   40 mg at 03/02/19 1014  . tiotropium (SPIRIVA) inhalation capsule ([facility] use ONLY) 18 mcg  18 mcg Inhalation Daily Lance Coon, MD   18 mcg at 03/02/19 0827     Discharge Medications: Please see discharge summary for a list of discharge medications.  Relevant Imaging Results:  Relevant Lab Results:   Additional Information SSN: [id number]  Avir Deruiter, Veronia Beets, LCSW

## 2019-03-02 NOTE — TOC Transition Note (Signed)
Transition of Care [facility]) - CM/SW Discharge Note   Patient Details  Name: APOORVA BUGAY MRN: [id number] Date of Birth: 29-Oct-1945  Transition of Care [facility]) CM/SW Contact:  Graciano Batson, Lenice Llamas Phone Number: [phone]  03/02/2019, 4:25 PM   Clinical Narrative: Per Claiborne Billings admissions coordinator at [facility] SNF authorization has been received and patient can come today to room 32-A. RN will call report and arrange EMS for transport. Clinical Education officer, museum (CSW) sent D/C orders to [facility] [facility] via [place]. Patient is aware of above. CSW contacted patient's daughter [name] and made her aware of above. Please reconsult if future social work needs arise. CSW signing off.      Final next level of care: Skilled Nursing Facility Barriers to Discharge: Barriers Resolved   Patient Goals and CMS Choice Patient states their goals for this hospitalization and ongoing recovery are:: Pain control. CMS Medicare.gov Compare Post Acute Care list provided to:: Patient Choice offered to / list presented to : Patient  Discharge Placement   Existing PASRR number confirmed : 03/02/19          Patient chooses bed at: [facility] Patient to be transferred to facility by: [facility] Name of family member notified: Patient's daughter [name] is aware of D/C today. Patient and family notified of of transfer: 03/02/19  Discharge Plan and Services In-house Referral: Clinical Social Work                                   Social Determinants of Health (SDOH) Interventions     Readmission Risk Interventions No flowsheet data found.

## 2019-03-02 NOTE — Progress Notes (Signed)
Subjective:  Patient seen in her hospital room today.  Patient is complaining of right hip/pelvis pain.  Patient sustained a mechanical fall yesterday at home.  Patient had just completed occupational therapy for her previous periprosthetic fracture of the right hip diagnosed in May.  Fracture was nondisplaced and did not require surgical intervention.  Patient had physical therapy evaluation this morning which is limited due to pain.  The physical therapy progress report indicates that the patient was max assist.  Objective:   VITALS:   Vitals:   03/02/19 0154 03/02/19 0808 03/02/19 1015 03/02/19 1043  BP: (!) 168/88 124/64 129/67 (!) 145/65  Pulse: (!) 108 80 84 84  Resp:  16    Temp:  97.8 F (36.6 C)    TempSrc:  Oral    SpO2:  98%  97%  Weight:      Height:        PHYSICAL EXAM: Right lower extremity: The skin is intact overlying the patient's right hip and pelvis.  She had mild tenderness of the lateral and anterior hip but had significant tenderness over the ischial tuberosity.  Patient had mild pain with logrolling and internal and external rotation of the right hip.  She can actively flex her right hip and knee to approximately 80 degrees and actively extend.  She has intact sensation light touch throughout the right lower extremity, palpable pedal pulses and intact motor function distally.   LABS  Results for orders placed or performed during the hospital encounter of 03/01/19 (from the past 24 hour(s))  Basic metabolic panel     Status: Abnormal   Collection Time: 03/01/19  7:40 PM  Result Value Ref Range   Sodium 140 135 - 145 mmol/L   Potassium 4.6 3.5 - 5.1 mmol/L   Chloride 111 98 - 111 mmol/L   CO2 21 (L) 22 - 32 mmol/L   Glucose, Bld 144 (H) 70 - 99 mg/dL   BUN 42 (H) 8 - 23 mg/dL   Creatinine, Ser 1.54 (H) 0.44 - 1.00 mg/dL   Calcium 8.4 (L) 8.9 - 10.3 mg/dL   GFR calc non Af Amer 33 (L) >60 mL/min   GFR calc Af Amer 39 (L) >60 mL/min   Anion gap 8 5 - 15   CBC with Differential     Status: Abnormal   Collection Time: 03/01/19  7:40 PM  Result Value Ref Range   WBC 9.1 4.0 - 10.5 K/uL   RBC 3.28 (L) 3.87 - 5.11 MIL/uL   Hemoglobin 10.5 (L) 12.0 - 15.0 g/dL   HCT 32.7 (L) 36.0 - 46.0 %   MCV 99.7 80.0 - 100.0 fL   MCH 32.0 26.0 - 34.0 pg   MCHC 32.1 30.0 - 36.0 g/dL   RDW 13.4 11.5 - 15.5 %   Platelets 120 (L) 150 - 400 K/uL   nRBC 0.0 0.0 - 0.2 %   Neutrophils Relative % 86 %   Neutro Abs 7.8 (H) 1.7 - 7.7 K/uL   Lymphocytes Relative 8 %   Lymphs Abs 0.7 0.7 - 4.0 K/uL   Monocytes Relative 5 %   Monocytes Absolute 0.5 0.1 - 1.0 K/uL   Eosinophils Relative 0 %   Eosinophils Absolute 0.0 0.0 - 0.5 K/uL   Basophils Relative 0 %   Basophils Absolute 0.0 0.0 - 0.1 K/uL   Immature Granulocytes 1 %   Abs Immature Granulocytes 0.06 0.00 - 0.07 K/uL  SARS Coronavirus 2 (CEPHEID - Performed in [place]  hospital lab), Hosp Order     Status: None   Collection Time: 03/01/19  7:40 PM   Specimen: Nasopharyngeal Swab  Result Value Ref Range   SARS Coronavirus 2 NEGATIVE NEGATIVE  Glucose, capillary     Status: Abnormal   Collection Time: 03/01/19 11:43 PM  Result Value Ref Range   Glucose-Capillary 195 (H) 70 - 99 mg/dL  Basic metabolic panel     Status: Abnormal   Collection Time: 03/02/19  5:24 AM  Result Value Ref Range   Sodium 139 135 - 145 mmol/L   Potassium 5.3 (H) 3.5 - 5.1 mmol/L   Chloride 110 98 - 111 mmol/L   CO2 22 22 - 32 mmol/L   Glucose, Bld 237 (H) 70 - 99 mg/dL   BUN 42 (H) 8 - 23 mg/dL   Creatinine, Ser 1.69 (H) 0.44 - 1.00 mg/dL   Calcium 8.2 (L) 8.9 - 10.3 mg/dL   GFR calc non Af Amer 30 (L) >60 mL/min   GFR calc Af Amer 35 (L) >60 mL/min   Anion gap 7 5 - 15  CBC     Status: Abnormal   Collection Time: 03/02/19  5:24 AM  Result Value Ref Range   WBC 8.5 4.0 - 10.5 K/uL   RBC 3.31 (L) 3.87 - 5.11 MIL/uL   Hemoglobin 10.6 (L) 12.0 - 15.0 g/dL   HCT 33.0 (L) 36.0 - 46.0 %   MCV 99.7 80.0 - 100.0 fL   MCH  32.0 26.0 - 34.0 pg   MCHC 32.1 30.0 - 36.0 g/dL   RDW 13.6 11.5 - 15.5 %   Platelets 129 (L) 150 - 400 K/uL   nRBC 0.0 0.0 - 0.2 %  Glucose, capillary     Status: Abnormal   Collection Time: 03/02/19  8:09 AM  Result Value Ref Range   Glucose-Capillary 208 (H) 70 - 99 mg/dL  Glucose, capillary     Status: Abnormal   Collection Time: 03/02/19 11:39 AM  Result Value Ref Range   Glucose-Capillary 214 (H) 70 - 99 mg/dL    Dg Chest Portable 1 View  Result Date: 03/01/2019 CLINICAL DATA:  Pt comes into the ED via EMS from home after a trip and fall landing on her buttock and having right buttock pain and had a right hip fx 2 months ago. pt stating she cant breath during exam EXAM: PORTABLE CHEST 1 VIEW COMPARISON:  01/03/2019 and older exams. FINDINGS: Cardiac surgery. Cardiac silhouette stable changes from prior is normal in size. No mediastinal or hilar masses. Lungs are hyperexpanded. There is mild interstitial thickening, chronic. There is no evidence of pneumonia or pulmonary edema. No pleural effusion or pneumothorax. Skeletal structures are demineralized but grossly intact. Stable changes from bilateral breast surgery. IMPRESSION: No acute cardiopulmonary disease. Electronically Signed   By: Lajean Manes M.D.   On: 03/01/2019 19:39   Dg Hip Unilat W Or Wo Pelvis 2-3 Views Right  Result Date: 03/01/2019 CLINICAL DATA:  Patient complains of fall today. Patient states her feet got twisted underneath her and she fell onto right side. Patient complains of mid right femur pain radiating up into her hip. Hx of right hip pinning. EXAM: DG HIP (WITH OR WITHOUT PELVIS) 2-3V RIGHT COMPARISON:  01/03/2019 FINDINGS: There is a subtle nondisplaced fracture of the right ilium. Fracture is non comminuted. No other evidence of an acute fracture. The right hip arthroplasty appears well seated and aligned and unchanged from the prior study. Skeletal structures are  diffusely demineralized. Soft tissues are  unremarkable other than surgical vascular clips along the medial right thigh and vascular calcifications. IMPRESSION: 1. Subtle nondisplaced, non comminuted fracture of the right ilium. 2. No other fractures. No evidence of disruption/loosening or malalignment of the right hip arthroplasty. Electronically Signed   By: Lajean Manes M.D.   On: 03/01/2019 17:38   Dg Femur Min 2 Views Right  Result Date: 03/01/2019 CLINICAL DATA:  Fall. EXAM: RIGHT FEMUR 2 VIEWS COMPARISON:  CT right hip and right hip and knee x-rays dated Jan 03, 2019. Right femur x-rays dated July 04, 2015. FINDINGS: New nondisplaced linear lucency through the right ilium. Subacute fracture of the right inferior pubic ramus with surrounding callus formation, new since May. 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 Osteopenia. Vascular calcifications. Multiple surgical clips again noted. IMPRESSION: 1. Acute nondisplaced fracture of the right ilium. 2. Subacute fracture of the right inferior pubic ramus, new since May. 3. 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 Electronically Signed   By: Titus Dubin M.D.   On: 03/01/2019 17:47    Assessment/Plan:     Principal Problem:   Pelvic fracture (HCC) Active Problems:   Type 2 diabetes mellitus (HCC)   GERD (gastroesophageal reflux disease)   COPD (chronic obstructive pulmonary disease) (HCC)   CAD (coronary artery disease)   HTN (hypertension)   CKD (chronic kidney disease), stage III (HCC)   PAF (paroxysmal atrial fibrillation) ([place])  I reviewed the patient's x-rays and compared them to her prior studies in May.  Given the patient's tenderness today, I am ordering a CT scan of the pelvis to further evaluate her fractures.  This will help determine the patient's treatment plan  including weightbearing status and prognosis.  I do not foresee surgical intervention will be necessary for her pelvic fractures.  Patient will be discharged to a skilled nursing facility after insurance approval has been obtained.    Thornton Park , MD 03/02/2019, 12:57 PM

## 2019-03-02 NOTE — Progress Notes (Signed)
Subjective:  Reviewed pelvic CT scan results.  Patient's ileum fracture is minimally displaced and extends into the acetabulum.   Recommend continued toe touch weight bearing based on CT scan.  May be discharged to SNF from an orthopaedic standpoint.   Patient should follow up with orthopaedics in 2 weeks.  Objective:   VITALS:   Vitals:   03/02/19 0154 03/02/19 0808 03/02/19 1015 03/02/19 1043  BP: (!) 168/88 124/64 129/67 (!) 145/65  Pulse: (!) 108 80 84 84  Resp:  16    Temp:  97.8 F (36.6 C)    TempSrc:  Oral    SpO2:  98%  97%  Weight:      Height:         LABS  Results for orders placed or performed during the hospital encounter of 03/01/19 (from the past 24 hour(s))  Basic metabolic panel     Status: Abnormal   Collection Time: 03/01/19  7:40 PM  Result Value Ref Range   Sodium 140 135 - 145 mmol/L   Potassium 4.6 3.5 - 5.1 mmol/L   Chloride 111 98 - 111 mmol/L   CO2 21 (L) 22 - 32 mmol/L   Glucose, Bld 144 (H) 70 - 99 mg/dL   BUN 42 (H) 8 - 23 mg/dL   Creatinine, Ser 1.54 (H) 0.44 - 1.00 mg/dL   Calcium 8.4 (L) 8.9 - 10.3 mg/dL   GFR calc non Af Amer 33 (L) >60 mL/min   GFR calc Af Amer 39 (L) >60 mL/min   Anion gap 8 5 - 15  CBC with Differential     Status: Abnormal   Collection Time: 03/01/19  7:40 PM  Result Value Ref Range   WBC 9.1 4.0 - 10.5 K/uL   RBC 3.28 (L) 3.87 - 5.11 MIL/uL   Hemoglobin 10.5 (L) 12.0 - 15.0 g/dL   HCT 32.7 (L) 36.0 - 46.0 %   MCV 99.7 80.0 - 100.0 fL   MCH 32.0 26.0 - 34.0 pg   MCHC 32.1 30.0 - 36.0 g/dL   RDW 13.4 11.5 - 15.5 %   Platelets 120 (L) 150 - 400 K/uL   nRBC 0.0 0.0 - 0.2 %   Neutrophils Relative % 86 %   Neutro Abs 7.8 (H) 1.7 - 7.7 K/uL   Lymphocytes Relative 8 %   Lymphs Abs 0.7 0.7 - 4.0 K/uL   Monocytes Relative 5 %   Monocytes Absolute 0.5 0.1 - 1.0 K/uL   Eosinophils Relative 0 %   Eosinophils Absolute 0.0 0.0 - 0.5 K/uL   Basophils Relative 0 %   Basophils Absolute 0.0 0.0 - 0.1 K/uL   Immature  Granulocytes 1 %   Abs Immature Granulocytes 0.06 0.00 - 0.07 K/uL  SARS Coronavirus 2 (CEPHEID - Performed in [place] hospital lab), Hosp Order     Status: None   Collection Time: 03/01/19  7:40 PM   Specimen: Nasopharyngeal Swab  Result Value Ref Range   SARS Coronavirus 2 NEGATIVE NEGATIVE  Glucose, capillary     Status: Abnormal   Collection Time: 03/01/19 11:43 PM  Result Value Ref Range   Glucose-Capillary 195 (H) 70 - 99 mg/dL  Basic metabolic panel     Status: Abnormal   Collection Time: 03/02/19  5:24 AM  Result Value Ref Range   Sodium 139 135 - 145 mmol/L   Potassium 5.3 (H) 3.5 - 5.1 mmol/L   Chloride 110 98 - 111 mmol/L   CO2 22 22 -  32 mmol/L   Glucose, Bld 237 (H) 70 - 99 mg/dL   BUN 42 (H) 8 - 23 mg/dL   Creatinine, Ser 1.69 (H) 0.44 - 1.00 mg/dL   Calcium 8.2 (L) 8.9 - 10.3 mg/dL   GFR calc non Af Amer 30 (L) >60 mL/min   GFR calc Af Amer 35 (L) >60 mL/min   Anion gap 7 5 - 15  CBC     Status: Abnormal   Collection Time: 03/02/19  5:24 AM  Result Value Ref Range   WBC 8.5 4.0 - 10.5 K/uL   RBC 3.31 (L) 3.87 - 5.11 MIL/uL   Hemoglobin 10.6 (L) 12.0 - 15.0 g/dL   HCT 33.0 (L) 36.0 - 46.0 %   MCV 99.7 80.0 - 100.0 fL   MCH 32.0 26.0 - 34.0 pg   MCHC 32.1 30.0 - 36.0 g/dL   RDW 13.6 11.5 - 15.5 %   Platelets 129 (L) 150 - 400 K/uL   nRBC 0.0 0.0 - 0.2 %  Glucose, capillary     Status: Abnormal   Collection Time: 03/02/19  8:09 AM  Result Value Ref Range   Glucose-Capillary 208 (H) 70 - 99 mg/dL  Glucose, capillary     Status: Abnormal   Collection Time: 03/02/19 11:39 AM  Result Value Ref Range   Glucose-Capillary 214 (H) 70 - 99 mg/dL    Ct Pelvis Wo Contrast  Result Date: 03/02/2019 CLINICAL DATA:  Pelvic pain after a fall yesterday. Initial encounter. EXAM: CT OF THE PELVIS WITHOUT CONTRAST TECHNIQUE: Multidetector CT imaging of the lower bilateral extremity was performed according to the standard protocol. COMPARISON:  Plain films right hip  03/01/2019. FINDINGS: Bones/Joint/Cartilage Right hip arthroplasty is in place. The patient has a fracture of the right ilium which begins approximately 4 cm above the acetabulum. The fracture extends in an inferior orientation through the posterior and medial walls of the acetabulum. The fracture is largely nondisplaced. At the level of the anterior aspect of the roof of the acetabulum, there is medial displacement of the posterior fracture fragment of 0.4 cm. Also seen is an acute right inferior pubic ramus fracture with mild lateral displacement of the distal fragment. The patient also has an acute nondisplaced fracture of the left parasymphyseal pubic bone. There is remote healed left inferior pubic ramus fracture. No other fracture is seen. Bones are osteopenic. No focal bony lesion. Ligaments Suboptimally assessed by CT. Muscles and Tendons Appear intact. Soft tissues Atherosclerosis noted. There is a small amount of hematoma about the patient's fractures. IMPRESSION: Right iliac wing fracture extends inferiorly through both the posterior and medial walls of the right acetabulum. The fracture is largely nondisplaced as described above. Acute right inferior pubic ramus and left parasymphyseal pubic bone fractures. Remote healed left inferior pubic ramus fracture. Osteopenia. Electronically Signed   By: Inge Rise M.D.   On: 03/02/2019 13:50   Dg Chest Portable 1 View  Result Date: 03/01/2019 CLINICAL DATA:  Pt comes into the ED via EMS from home after a trip and fall landing on her buttock and having right buttock pain and had a right hip fx 2 months ago. pt stating she cant breath during exam EXAM: PORTABLE CHEST 1 VIEW COMPARISON:  01/03/2019 and older exams. FINDINGS: Cardiac surgery. Cardiac silhouette stable changes from prior is normal in size. No mediastinal or hilar masses. Lungs are hyperexpanded. There is mild interstitial thickening, chronic. There is no evidence of pneumonia or pulmonary  edema. No pleural effusion  or pneumothorax. Skeletal structures are demineralized but grossly intact. Stable changes from bilateral breast surgery. IMPRESSION: No acute cardiopulmonary disease. Electronically Signed   By: Lajean Manes M.D.   On: 03/01/2019 19:39   Dg Hip Unilat W Or Wo Pelvis 2-3 Views Right  Result Date: 03/01/2019 CLINICAL DATA:  Patient complains of fall today. Patient states her feet got twisted underneath her and she fell onto right side. Patient complains of mid right femur pain radiating up into her hip. Hx of right hip pinning. EXAM: DG HIP (WITH OR WITHOUT PELVIS) 2-3V RIGHT COMPARISON:  01/03/2019 FINDINGS: There is a subtle nondisplaced fracture of the right ilium. Fracture is non comminuted. No other evidence of an acute fracture. The right hip arthroplasty appears well seated and aligned and unchanged from the prior study. Skeletal structures are diffusely demineralized. Soft tissues are unremarkable other than surgical vascular clips along the medial right thigh and vascular calcifications. IMPRESSION: 1. Subtle nondisplaced, non comminuted fracture of the right ilium. 2. No other fractures. No evidence of disruption/loosening or malalignment of the right hip arthroplasty. Electronically Signed   By: Lajean Manes M.D.   On: 03/01/2019 17:38   Dg Femur Min 2 Views Right  Result Date: 03/01/2019 CLINICAL DATA:  Fall. EXAM: RIGHT FEMUR 2 VIEWS COMPARISON:  CT right hip and right hip and knee x-rays dated Jan 03, 2019. Right femur x-rays dated July 04, 2015. FINDINGS: New nondisplaced linear lucency through the right ilium. Subacute fracture of the right inferior pubic ramus with surrounding callus formation, new since May. 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 Osteopenia. Vascular calcifications. Multiple surgical clips  again noted. IMPRESSION: 1. Acute nondisplaced fracture of the right ilium. 2. Subacute fracture of the right inferior pubic ramus, new since May. 3. 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 Electronically Signed   By: Titus Dubin M.D.   On: 03/01/2019 17:47    Assessment/Plan:     Principal Problem:   Pelvic fracture (HCC) Active Problems:   Type 2 diabetes mellitus (HCC)   GERD (gastroesophageal reflux disease)   COPD (chronic obstructive pulmonary disease) (HCC)   CAD (coronary artery disease)   HTN (hypertension)   CKD (chronic kidney disease), stage III (HCC)   PAF (paroxysmal atrial fibrillation) (HCC)      Thornton Park , MD 03/02/2019, 2:37 PM

## 2019-03-02 NOTE — Progress Notes (Signed)
Report called to [place] @ [facility] care. EMS called for transportation.

## 2019-03-02 NOTE — Progress Notes (Signed)
Physical Therapy Evaluation Patient Details Name: Anita Wilson MRN: [id number] DOB: 02-05-1946 Today's Date: 03/02/2019   History of Present Illness  Anita Wilson  is a 73 y.o. female who presents with chief complaint as above.  Patient presents the ED with mechanical fall.  She has had recent leg fractures, and has been following with PT and OT as well as orthopedic surgery for recovery.  She states that she was just about finished with her therapy and had been feeling very good and started walking without her walker.  She had an accidental mechanical fall tonight, and suffered subsequent pelvic fractures.   Clinical Impression  Patient is lying in bed and NSG is doing a med pass. She is having 8/10 pain to right pelvis. She needs max assist for bed mobility supine to sit <> sit to supine, mod assist for transfers sit to stand with Rw. She is able to stand for 3 mins x 1 , 1 min x 1 with pain increasing. She is able to sit at the side of the bed for 5 mins. She is not able to take any steps or alternate raising  her feet off the floor with the RW for assist. She is repositioned in the bed following bed mobility and transfers. She has poor strength to RLE due to pain. She will continue to need skilled PT to return to her PLOF.   Follow Up Recommendations SNF    Equipment Recommendations  Rolling walker with 5" wheels    Recommendations for Other Services       Precautions / Restrictions        Mobility  Bed Mobility Overal bed mobility: Needs Assistance Bed Mobility: Supine to Sit;Sit to Supine     Supine to sit: Mod assist Sit to supine: Mod assist   General bed mobility comments: VC for sequencing  Transfers Overall transfer level: Needs assistance Equipment used: Rolling walker (2 wheeled)             General transfer comment: VC for sequencing  and  mod assist for LE's  Ambulation/Gait                Stairs            Wheelchair Mobility     Modified Rankin (Stroke Patients Only)       Balance                                             Pertinent Vitals/Pain Pain Assessment: 0-10 Pain Score: 8  Pain Location: (pelvis right side) Pain Descriptors / Indicators: Aching Pain Intervention(s): Limited activity within patient's tolerance;Monitored during session    Home Living Family/patient expects to be discharged to:: Private residence Living Arrangements: Alone Available Help at Discharge: Family Type of Home: House Home Access: Stairs to enter Entrance Stairs-Rails: Psychiatric nurse of Steps: 4   Home Equipment: Environmental consultant - 2 wheels      Prior Function Level of Independence: Independent with assistive device(s)               Hand Dominance        Extremity/Trunk Assessment   Upper Extremity Assessment Upper Extremity Assessment: Overall WFL for tasks assessed            Communication   Communication: No difficulties  Cognition Arousal/Alertness: Awake/alert Behavior During Therapy: [facility] for tasks  assessed/performed Overall Cognitive Status: Within Functional Limits for tasks assessed                                        General Comments      Exercises     Assessment/Plan    PT Assessment Patient needs continued PT services  PT Problem List Decreased strength;Decreased activity tolerance;Decreased balance;Decreased mobility;Decreased safety awareness;Pain       PT Treatment Interventions Gait training;Functional mobility training;Therapeutic activities;Therapeutic exercise;Neuromuscular re-education;Patient/family education    PT Goals (Current goals can be found in the Care Plan section)  Acute Rehab PT Goals Patient Stated Goal: to walk  PT Goal Formulation: With patient Time For Goal Achievement: 03/16/19 Potential to Achieve Goals: Fair    Frequency Min 2X/week   Barriers to discharge Inaccessible home environment       Co-evaluation               AM-PAC PT "6 Clicks" Mobility  Outcome Measure Help needed turning from your back to your side while in a flat bed without using bedrails?: A Lot Help needed moving from lying on your back to sitting on the side of a flat bed without using bedrails?: A Lot Help needed moving to and from a bed to a chair (including a wheelchair)?: A Lot Help needed standing up from a chair using your arms (e.g., wheelchair or bedside chair)?: A Lot Help needed to walk in hospital room?: Total Help needed climbing 3-5 steps with a railing? : Total 6 Click Score: 10    End of Session Equipment Utilized During Treatment: Gait belt;Oxygen Activity Tolerance: Patient limited by pain Patient left: with bed alarm set Nurse Communication: Mobility status PT Visit Diagnosis: Muscle weakness (generalized) (M62.81);History of falling (Z91.81);Difficulty in walking, not elsewhere classified (R26.2)    Time: 1020-1045 PT Time Calculation (min) (ACUTE ONLY): 25 min   Charges:   PT Evaluation $PT Eval Low Complexity: 1 Low PT Treatments $Therapeutic Activity: 8-22 mins          Alanson Puls, PT DPT 03/02/2019, 10:53 AM

## 2019-03-02 NOTE — Progress Notes (Signed)
Inpatient Diabetes Program Recommendations  AACE/ADA: New Consensus Statement on Inpatient Glycemic Control  Target Ranges:  Prepandial:   less than 140 mg/dL      Peak postprandial:   less than 180 mg/dL (1-2 hours)      Critically ill patients:  140 - 180 mg/dL   Results for Anita Wilson, Anita Wilson (MRN [id number]) as of 03/02/2019 09:47  Ref. Range 03/01/2019 23:43 03/02/2019 08:09  Glucose-Capillary Latest Ref Range: 70 - 99 mg/dL 195 (H) 208 (H)   Review of Glycemic Control  Diabetes history: DM2 Outpatient Diabetes medications: Lantus 12-16 units QHS, Glipizide 10 mg BID Current orders for Inpatient glycemic control: Novolog 0-9 units TID with meals, Novolog 0-5 units QHS  Inpatient Diabetes Program Recommendations:   Insulin - Basal: If glucose is consistenlty greater than 180 mg/dl, please consider ordering Lantus 5 units Q24H (based on 48 kg x 0.1 units).  Thanks, Barnie Alderman, RN, MSN, CDE Diabetes Coordinator Inpatient Diabetes Program [phone] (Team Pager from 8am to 5pm)

## 2019-03-02 NOTE — Discharge Summary (Signed)
[place] [facility] [place] NAME: Anita Wilson    MR#:  [id number]  DATE OF BIRTH:  Jan 04, 1946  DATE OF ADMISSION:  03/01/2019 ADMITTING PHYSICIAN: Lance Coon, MD  DATE OF DISCHARGE: 03/02/2019  PRIMARY CARE PHYSICIAN: Alessandra Grout, MD    ADMISSION DIAGNOSIS:  Multiple closed fractures of pelvis without disruption of pelvic ring, initial encounter ([place]) [S32.82XA]  DISCHARGE DIAGNOSIS:  Principal Problem:   Pelvic fracture (HCC) Active Problems:   Type 2 diabetes mellitus (HCC)   GERD (gastroesophageal reflux disease)   COPD (chronic obstructive pulmonary disease) (HCC)   CAD (coronary artery disease)   HTN (hypertension)   CKD (chronic kidney disease), stage III (HCC)   PAF (paroxysmal atrial fibrillation) ([place])   SECONDARY DIAGNOSIS:   Past Medical History:  Diagnosis Date  . A-fib ([place])   . Allergy   . Breast cancer ([place])   . CAD (coronary artery disease)   . Chronic diastolic heart failure ([place])   . CKD (chronic kidney disease), stage III ([place])   . COPD (chronic obstructive pulmonary disease) ([place])   . Depression   . Diabetes mellitus without complication ([place])   . Hypertension   . Pulmonary HTN ([place])     HOSPITAL COURSE:   73 year old female with a history of PAF, chronic kidney disease stage III and COPD who presented after mechanical fall.  1.  Acute fracture of the ileum which is nondisplaced and subacute fracture right inferior pubic rami: This is after mechanical fall.  Patient was eval by orthopedic surgery.  Patient will need ongoing supportive management.  This is nonoperative.  Patient will need to continue physical therapy.  Toe-touch weightbearing on the right lower extremity and use a walker for assistance with ambulation is recommended. She will follow-up with Dr. Posey Pronto in 1 to 2 weeks.  2.  Diabetes: Patient continue with outpatient regimen and ADA diet   3.  PAF: Continue aspirin, Coreg  4.   Essential hypertension: Continue clonidine, Coreg, isosorbide  5.  COPD without signs of exacerbation  6.  History of breast cancer: Continue Femara  7.  GERD: Continue PPI  8. Tobacco dependence: Patient is encouraged to quit smoking and willing to attempt to quit was assessed. Patient highly motivated.Counseling was provided for 4 minutes.  DISCHARGE CONDITIONS AND DIET:  Stable Diabetic diet  CONSULTS OBTAINED:    DRUG ALLERGIES:   Allergies  Allergen Reactions  . Bee Venom Shortness Of Breath and Swelling  . Benzodiazepines     Other reaction(s): Confusion  . Diphenhydramine Other (See Comments)    Took tylenol pm and end up in the hospital something wrong with my heart  . Morphine Other (See Comments)    Reaction:  Hallucinations   . Phenylalanine Diarrhea and Nausea And Vomiting  . Sulfa Antibiotics Other (See Comments)    metallic smell and taste    DISCHARGE MEDICATIONS:   Allergies as of 03/02/2019      Reactions   Bee Venom Shortness Of Breath, Swelling   Benzodiazepines    Other reaction(s): Confusion   Diphenhydramine Other (See Comments)   Took tylenol pm and end up in the hospital something wrong with my heart   Morphine Other (See Comments)   Reaction:  Hallucinations    Phenylalanine Diarrhea, Nausea And Vomiting   Sulfa Antibiotics Other (See Comments)   metallic smell and taste      Medication List    STOP taking these medications  enoxaparin 30 MG/0.3ML injection Commonly known as: LOVENOX     TAKE these medications   acetaminophen 325 MG tablet Commonly known as: TYLENOL Take 650 mg by mouth every 4 (four) hours as needed for mild pain.   aspirin EC 81 MG tablet Take 81 mg by mouth daily.   atorvastatin 40 MG tablet Commonly known as: LIPITOR Take 40 mg by mouth daily.   carvedilol 25 MG tablet Commonly known as: COREG Take 25 mg by mouth 2 (two) times daily.   cholecalciferol 1000 units tablet Commonly known as: VITAMIN  D Take 2,000 Units by mouth daily.   citalopram 10 MG tablet Commonly known as: CELEXA Take 10 mg by mouth daily.   cloNIDine 0.1 MG tablet Commonly known as: CATAPRES TAKE 1 TABLET BY MOUTH  NIGHTLY   docusate sodium 100 MG capsule Commonly known as: COLACE Take 1 capsule (100 mg total) by mouth 2 (two) times daily.   ferrous sulfate 325 (65 FE) MG tablet Take 1 tablet (325 mg total) by mouth daily.   glipiZIDE 10 MG tablet Commonly known as: GLUCOTROL Take 10 mg by mouth 2 (two) times daily.   hydrALAZINE 10 MG tablet Commonly known as: APRESOLINE Take 10 mg by mouth 3 (three) times daily.   HYDROcodone-acetaminophen 5-325 MG tablet Commonly known as: NORCO/VICODIN Take 1 tablet by mouth every 4 (four) hours as needed for moderate pain or severe pain.   insulin glargine 100 UNIT/ML injection Commonly known as: Lantus Inject 0.1 mLs (10 Units total) into the skin at bedtime. What changed: how much to take   isosorbide mononitrate 30 MG 24 hr tablet Commonly known as: IMDUR Take 30 mg by mouth daily.   letrozole 2.5 MG tablet Commonly known as: FEMARA Take 2.5 mg by mouth daily.   omeprazole 20 MG capsule Commonly known as: PRILOSEC Take 20 mg by mouth daily.   polyethylene glycol 17 g packet Commonly known as: MIRALAX / GLYCOLAX Take 17 g by mouth daily as needed for mild constipation.   tiotropium 18 MCG inhalation capsule Commonly known as: SPIRIVA Place 18 mcg into inhaler and inhale daily.         Today   CHIEF COMPLAINT:  Patient doing better this am    VITAL SIGNS:  Blood pressure (!) 145/65, pulse 84, temperature 97.8 F (36.6 C), temperature source Oral, resp. rate 16, height 5\' 4"  (1.626 m), weight 48.1 kg, SpO2 97 %.   REVIEW OF SYSTEMS:  Review of Systems  Constitutional: Negative.  Negative for chills, fever and malaise/fatigue.  HENT: Negative.  Negative for ear discharge, ear pain, hearing loss, nosebleeds and sore throat.    Eyes: Negative.  Negative for blurred vision and pain.  Respiratory: Negative.  Negative for cough, hemoptysis, shortness of breath and wheezing.   Cardiovascular: Negative.  Negative for chest pain, palpitations and leg swelling.  Gastrointestinal: Negative.  Negative for abdominal pain, blood in stool, diarrhea, nausea and vomiting.  Genitourinary: Negative.  Negative for dysuria.  Musculoskeletal: Positive for joint pain. Negative for back pain.  Skin: Negative.   Neurological: Negative for dizziness, tremors, speech change, focal weakness, seizures and headaches.  Endo/Heme/Allergies: Negative.  Does not bruise/bleed easily.  Psychiatric/Behavioral: Negative.  Negative for depression, hallucinations and suicidal ideas.     PHYSICAL EXAMINATION:  GENERAL:  73 y.o.-year-old patient lying in the bed with no acute distress.  NECK:  Supple, no jugular venous distention. No thyroid enlargement, no tenderness.  LUNGS: Normal breath sounds bilaterally, no  wheezing, rales,rhonchi  No use of accessory muscles of respiration.  CARDIOVASCULAR: S1, S2 normal. No murmurs, rubs, or gallops.  ABDOMEN: Soft, non-tender, non-distended. Bowel sounds present. No organomegaly or mass.  EXTREMITIES: No pedal edema, cyanosis, or clubbing.  PSYCHIATRIC: The patient is alert and oriented x 3.  SKIN: No obvious rash, lesion, or ulcer.   DATA REVIEW:   CBC Recent Labs  Lab 03/02/19 0524  WBC 8.5  HGB 10.6*  HCT 33.0*  PLT 129*    Chemistries  Recent Labs  Lab 03/02/19 0524  NA 139  K 5.3*  CL 110  CO2 22  GLUCOSE 237*  BUN 42*  CREATININE 1.69*  CALCIUM 8.2*    Cardiac Enzymes No results for input(s): TROPONINI in the last 168 hours.  Microbiology Results  @MICRORSLT48 @  RADIOLOGY:  Dg Chest Portable 1 View  Result Date: 03/01/2019 CLINICAL DATA:  Pt comes into the ED via EMS from home after a trip and fall landing on her buttock and having right buttock pain and had a right hip  fx 2 months ago. pt stating she cant breath during exam EXAM: PORTABLE CHEST 1 VIEW COMPARISON:  01/03/2019 and older exams. FINDINGS: Cardiac surgery. Cardiac silhouette stable changes from prior is normal in size. No mediastinal or hilar masses. Lungs are hyperexpanded. There is mild interstitial thickening, chronic. There is no evidence of pneumonia or pulmonary edema. No pleural effusion or pneumothorax. Skeletal structures are demineralized but grossly intact. Stable changes from bilateral breast surgery. IMPRESSION: No acute cardiopulmonary disease. Electronically Signed   By: Lajean Manes M.D.   On: 03/01/2019 19:39   Dg Hip Unilat W Or Wo Pelvis 2-3 Views Right  Result Date: 03/01/2019 CLINICAL DATA:  Patient complains of fall today. Patient states her feet got twisted underneath her and she fell onto right side. Patient complains of mid right femur pain radiating up into her hip. Hx of right hip pinning. EXAM: DG HIP (WITH OR WITHOUT PELVIS) 2-3V RIGHT COMPARISON:  01/03/2019 FINDINGS: There is a subtle nondisplaced fracture of the right ilium. Fracture is non comminuted. No other evidence of an acute fracture. The right hip arthroplasty appears well seated and aligned and unchanged from the prior study. Skeletal structures are diffusely demineralized. Soft tissues are unremarkable other than surgical vascular clips along the medial right thigh and vascular calcifications. IMPRESSION: 1. Subtle nondisplaced, non comminuted fracture of the right ilium. 2. No other fractures. No evidence of disruption/loosening or malalignment of the right hip arthroplasty. Electronically Signed   By: Lajean Manes M.D.   On: 03/01/2019 17:38   Dg Femur Min 2 Views Right  Result Date: 03/01/2019 CLINICAL DATA:  Fall. EXAM: RIGHT FEMUR 2 VIEWS COMPARISON:  CT right hip and right hip and knee x-rays dated Jan 03, 2019. Right femur x-rays dated July 04, 2015. FINDINGS: New nondisplaced linear lucency through the right  ilium. Subacute fracture of the right inferior pubic ramus with surrounding callus formation, new since May. 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 Osteopenia. Vascular calcifications. Multiple surgical clips again noted. IMPRESSION: 1. Acute nondisplaced fracture of the right ilium. 2. Subacute fracture of the right inferior pubic ramus, new since May. 3. 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 Electronically Signed   By: Titus Dubin M.D.   On: 03/01/2019 17:47  Allergies as of 03/02/2019      Reactions   Bee Venom Shortness Of Breath, Swelling   Benzodiazepines    Other reaction(s): Confusion   Diphenhydramine Other (See Comments)   Took tylenol pm and end up in the hospital something wrong with my heart   Morphine Other (See Comments)   Reaction:  Hallucinations    Phenylalanine Diarrhea, Nausea And Vomiting   Sulfa Antibiotics Other (See Comments)   metallic smell and taste      Medication List    STOP taking these medications   enoxaparin 30 MG/0.3ML injection Commonly known as: LOVENOX     TAKE these medications   acetaminophen 325 MG tablet Commonly known as: TYLENOL Take 650 mg by mouth every 4 (four) hours as needed for mild pain.   aspirin EC 81 MG tablet Take 81 mg by mouth daily.   atorvastatin 40 MG tablet Commonly known as: LIPITOR Take 40 mg by mouth daily.   carvedilol 25 MG tablet Commonly known as: COREG Take 25 mg by mouth 2 (two) times daily.   cholecalciferol 1000 units tablet Commonly known as: VITAMIN D Take 2,000 Units by mouth daily.   citalopram 10 MG tablet Commonly known as: CELEXA Take 10 mg by mouth daily.   cloNIDine 0.1 MG tablet Commonly known as: CATAPRES TAKE 1 TABLET BY MOUTH  NIGHTLY   docusate sodium 100 MG  capsule Commonly known as: COLACE Take 1 capsule (100 mg total) by mouth 2 (two) times daily.   ferrous sulfate 325 (65 FE) MG tablet Take 1 tablet (325 mg total) by mouth daily.   glipiZIDE 10 MG tablet Commonly known as: GLUCOTROL Take 10 mg by mouth 2 (two) times daily.   hydrALAZINE 10 MG tablet Commonly known as: APRESOLINE Take 10 mg by mouth 3 (three) times daily.   HYDROcodone-acetaminophen 5-325 MG tablet Commonly known as: NORCO/VICODIN Take 1 tablet by mouth every 4 (four) hours as needed for moderate pain or severe pain.   insulin glargine 100 UNIT/ML injection Commonly known as: Lantus Inject 0.1 mLs (10 Units total) into the skin at bedtime. What changed: how much to take   isosorbide mononitrate 30 MG 24 hr tablet Commonly known as: IMDUR Take 30 mg by mouth daily.   letrozole 2.5 MG tablet Commonly known as: FEMARA Take 2.5 mg by mouth daily.   omeprazole 20 MG capsule Commonly known as: PRILOSEC Take 20 mg by mouth daily.   polyethylene glycol 17 g packet Commonly known as: MIRALAX / GLYCOLAX Take 17 g by mouth daily as needed for mild constipation.   tiotropium 18 MCG inhalation capsule Commonly known as: SPIRIVA Place 18 mcg into inhaler and inhale daily.          Management plans discussed with the patient and family and they are in agreement. Stable for discharge   Patient should follow up with pcp  CODE STATUS:     Code Status Orders  (From admission, onward)         Start     Ordered   03/01/19 2328  Full code  Continuous     03/01/19 2327        Code Status History    Date Active Date Inactive Code Status Order ID Comments User Context   01/03/2019 1024 01/06/2019 1753 Full Code [id number]  Gorden Harms, MD Inpatient   07/25/2015 1758 07/29/2015 1930 Full Code [id number]  Corky Mull, MD Inpatient   07/25/2015 0031 07/25/2015 1758 Full  Code [id number]  Harrie Foreman, MD Inpatient   07/24/2015 2315 07/25/2015 0031 Full  Code [id number]  Hessie Knows, MD ED   07/10/2015 1904 07/13/2015 1650 Full Code [id number]  Corky Mull, MD Inpatient   07/10/2015 0210 07/10/2015 1904 Full Code [id number]  Lance Coon, MD ED   07/10/2015 0021 07/10/2015 0210 Full Code [id number]  Hooten, Laurice Record, MD ED   Advance Care Planning Activity      TOTAL TIME TAKING CARE OF THIS PATIENT: 38 minutes.    Note: This dictation was prepared with Dragon dictation along with smaller phrase technology. Any transcriptional errors that result from this process are unintentional.  Bettey Costa M.D on 03/02/2019 at 11:49 AM  Between 7am to 6pm - Pager - [phone] After 6pm go to [email or web address] - password [email or web address] [facility]  Office  [phone]  CC: Primary care physician; Alessandra Grout, MD

## 2019-03-02 NOTE — Progress Notes (Signed)
Bladder scan 474 ml, MD Mody notified. Orders received for in and out X1. Order placed. Per MD pt can d/c after.

## 2019-03-02 NOTE — Plan of Care (Signed)
  Problem: Safety: Goal: Ability to remain free from injury will improve Outcome: Completed/Met

## 2019-03-02 NOTE — TOC Initial Note (Signed)
Transition of Care [facility]) - Initial/Assessment Note    Patient Details  Name: Anita Wilson MRN: [id number] Date of Birth: 1946/07/10  Transition of Care [facility]) CM/SW Contact:    Sakari Alkhatib, Lenice Llamas Phone Number: [phone]  03/02/2019, 12:15 PM  Clinical Narrative: PT is recommending SNF. Clinical Education officer, museum (CSW) met with patient alone at bedside to discuss D/C plan. Patient was alert and oriented X4 and was laying in the bed. CSW introduced self and explained role of CSW department. Per patient she lives alone in [place] however her daughter Anita Wilson and 3 grandchildren are staying with her right now. Per patient Anita Wilson is separated from her husband. CSW explained SNF process and that [facility] will have to approve SNF. Patient is agreeable to SNF search in [facility]. FL2 complete and faxed out. CSW will continue to follow and assist as needed.                 Expected Discharge Plan: Skilled Nursing Facility Barriers to Discharge: Insurance Authorization   Patient Goals and CMS Choice Patient states their goals for this hospitalization and ongoing recovery are:: Pain Control      Expected Discharge Plan and Services Expected Discharge Plan: [place] In-house Referral: Clinical Social Work     Living arrangements for the past 2 months: Single Family Home                                      Prior Living Arrangements/Services Living arrangements for the past 2 months: Single Family Home Lives with:: Self Patient language and need for interpreter reviewed:: No Do you feel safe going back to the place where you live?: Yes      Need for Family Participation in Patient Care: Yes (Comment) Care giver support system in place?: No (comment)   Criminal Activity/Legal Involvement Pertinent to Current Situation/Hospitalization: No - Comment as needed  Activities of Daily Living Home Assistive Devices/Equipment: Shower chair with back, Walker  (specify type), CBG Meter ADL Screening (condition at time of admission) Patient's cognitive ability adequate to safely complete daily activities?: Yes Is the patient deaf or have difficulty hearing?: No Does the patient have difficulty seeing, even when wearing glasses/contacts?: No Does the patient have difficulty concentrating, remembering, or making decisions?: No Patient able to express need for assistance with ADLs?: Yes Does the patient have difficulty dressing or bathing?: Yes Independently performs ADLs?: No Communication: Independent Dressing (OT): Needs assistance Is this a change from baseline?: Change from baseline, expected to last <3days Grooming: Needs assistance Is this a change from baseline?: Change from baseline, expected to last <3 days Feeding: Independent with device (comment) Bathing: Needs assistance Is this a change from baseline?: Change from baseline, expected to last <3 days Toileting: Needs assistance Is this a change from baseline?: Change from baseline, expected to last <3 days In/Out Bed: Needs assistance Is this a change from baseline?: Change from baseline, expected to last <3 days Walks in Home: Independent with device (comment) Does the patient have difficulty walking or climbing stairs?: No Weakness of Legs: Both Weakness of Arms/Hands: None  Permission Sought/Granted Permission sought to share information with : Facility Art therapist granted to share information with : Yes, Verbal Permission Granted              Emotional Assessment Appearance:: Appears stated age   Affect (typically observed): Calm, Pleasant Orientation: :  Oriented to Self, Oriented to Place, Oriented to  Time, Oriented to Situation Alcohol / Substance Use: Not Applicable Psych Involvement: No (comment)  Admission diagnosis:  Multiple closed fractures of pelvis without disruption of pelvic ring, initial encounter [facility]) [S32.82XA] Patient Active  Problem List   Diagnosis Date Noted  . Pelvic fracture ([place]) 03/01/2019  . Hip fracture ([place]) 01/03/2019  . 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 Fracture of femoral neck, right ([place]) 07/10/2015  . Type 2 diabetes mellitus ([place]) 07/10/2015  . GERD (gastroesophageal reflux disease) 07/10/2015  . COPD (chronic obstructive pulmonary disease) ([place]) 07/10/2015  . CAD (coronary artery disease) 07/10/2015  . HTN (hypertension) 07/10/2015  . Chronic diastolic CHF (congestive heart failure) ([place]) 07/10/2015  . CKD (chronic kidney disease), stage III ([place]) 07/10/2015  . PAF (paroxysmal atrial fibrillation) ([place]) 07/10/2015   PCP:  Alessandra Grout, MD Pharmacy:   [facility] [address] [place] [address] Phone: [phone] Fax: [place] [address]), [place] - [place]) [place] [address] Phone: [phone] Fax: [phone]     Social Determinants of Health (SDOH) Interventions    Readmission Risk Interventions No flowsheet data found.

## 2019-03-02 NOTE — Progress Notes (Signed)
   03/02/19 1100  Clinical Encounter Type  Visited With Patient  Visit Type Initial  Referral From Physician  Stress Factors  Patient Stress Factors Health changes;Loss of control   Chaplain received an OR to complete or update an AD. Upon arrival, the patient was reclined in bed. She was pleasant and welcoming, albeit a bit down. She expressed frustration/discouragement with being back in this place (literal and figurative) again. The patient expressed stated that she is asking, "why does this keep happening to me" after sharing the history of her hip fractures/injuries. She is a person of faith and she said that she believes that "God doesn't put anything more on [place] than we can bear", but she wonders how much she can actually take because "this feels like too much." The patient also shared that she is a 2x cancer survivor and believes that she is here for a reason. The chaplain provided support in the form of compassionate presence, active and reflective listening, and encouragement. The patient appeared to be drowsy and remarked that all of the pain medication was causing her to feel this way. Chaplain confirmed that the patient is interested in the AD and offered to check back in with her later to allow her to rest. Brochure left for the patient's review. The patient expressed appreciation for the visit and confirmed interest in follow-up.

## 2019-03-02 NOTE — TOC Progression Note (Signed)
Transition of Care [facility]) - Progression Note    Patient Details  Name: Anita Wilson MRN: [id number] Date of Birth: [birth year]  Transition of Care [facility]) CM/SW Contact  Jamarian Jacinto, Lenice Llamas Phone Number: [phone]  03/02/2019, 12:32 PM  Clinical Narrative: Clinical Social Worker (CSW) presented bed offers to patient and gave her the medicare.gov SNF list. Patient chose [facility]. CSW sent [facility] admissions coordinator at [facility] a message making her aware of above and asked her to start [facility] SNF authorization today. CSW will continue to follow and assist as needed.     Expected Discharge Plan: Skilled Nursing Facility Barriers to Discharge: Insurance Authorization  Expected Discharge Plan and Services Expected Discharge Plan: [place] In-house Referral: Clinical Social Work     Living arrangements for the past 2 months: Single Family Home Expected Discharge Date: 03/02/19                                     Social Determinants of Health (SDOH) Interventions    Readmission Risk Interventions No flowsheet data found.

## 2019-03-02 NOTE — Progress Notes (Signed)
[place] [facility] [place] NAME: Anita Wilson    MR#:  [id number]  DATE OF BIRTH:  08-14-1946  SUBJECTIVE:   Patient with improved pelvic pain this am   REVIEW OF SYSTEMS:    Review of Systems  Constitutional: Negative for fever, chills weight loss HENT: Negative for ear pain, nosebleeds, congestion, facial swelling, rhinorrhea, neck pain, neck stiffness and ear discharge.   Respiratory: Negative for cough, shortness of breath, wheezing  Cardiovascular: Negative for chest pain, palpitations and leg swelling.  Gastrointestinal: Negative for heartburn, abdominal pain, vomiting, diarrhea or consitpation Genitourinary: Negative for dysuria, urgency, frequency, hematuria Musculoskeletal: Negative for back pain or++improved  joint pain Neurological: Negative for dizziness, seizures, syncope, focal weakness,  numbness and headaches.  Hematological: Does not bruise/bleed easily.  Psychiatric/Behavioral: Negative for hallucinations, confusion, dysphoric mood    Tolerating Diet: yes      DRUG ALLERGIES:   Allergies  Allergen Reactions  . Bee Venom Shortness Of Breath and Swelling  . Benzodiazepines     Other reaction(s): Confusion  . Diphenhydramine Other (See Comments)    Took tylenol pm and end up in the hospital something wrong with my heart  . Morphine Other (See Comments)    Reaction:  Hallucinations   . Phenylalanine Diarrhea and Nausea And Vomiting  . Sulfa Antibiotics Other (See Comments)    metallic smell and taste    VITALS:  Blood pressure (!) 145/65, pulse 84, temperature 97.8 F (36.6 C), temperature source Oral, resp. rate 16, height 5\' 4"  (1.626 m), weight 48.1 kg, SpO2 97 %.  PHYSICAL EXAMINATION:  Constitutional: Appears well-developed and well-nourished. No distress. HENT: Normocephalic. Marland Kitchen Oropharynx is clear and moist.  Eyes: Conjunctivae and EOM are normal. PERRLA, no scleral icterus.  Neck: Normal ROM. Neck  supple. No JVD. No tracheal deviation. CVS: RRR, S1/S2 +, no murmurs, no gallops, no carotid bruit.  Pulmonary: Effort and breath sounds normal, no stridor, rhonchi, wheezes, rales.  Abdominal: Soft. BS +,  no distension, tenderness, rebound or guarding.  Musculoskeletal: Normal range of motion. No edema and no tenderness.  Neuro: Alert. CN 2-12 grossly intact. No focal deficits. Skin: Skin is warm and dry. No rash noted. Psychiatric: Normal mood and affect.      LABORATORY PANEL:   CBC Recent Labs  Lab 03/02/19 0524  WBC 8.5  HGB 10.6*  HCT 33.0*  PLT 129*   ------------------------------------------------------------------------------------------------------------------  Chemistries  Recent Labs  Lab 03/02/19 0524  NA 139  K 5.3*  CL 110  CO2 22  GLUCOSE 237*  BUN 42*  CREATININE 1.69*  CALCIUM 8.2*   ------------------------------------------------------------------------------------------------------------------  Cardiac Enzymes No results for input(s): TROPONINI in the last 168 hours. ------------------------------------------------------------------------------------------------------------------  RADIOLOGY:  Dg Chest Portable 1 View  Result Date: 03/01/2019 CLINICAL DATA:  Pt comes into the ED via EMS from home after a trip and fall landing on her buttock and having right buttock pain and had a right hip fx 2 months ago. pt stating she cant breath during exam EXAM: PORTABLE CHEST 1 VIEW COMPARISON:  01/03/2019 and older exams. FINDINGS: Cardiac surgery. Cardiac silhouette stable changes from prior is normal in size. No mediastinal or hilar masses. Lungs are hyperexpanded. There is mild interstitial thickening, chronic. There is no evidence of pneumonia or pulmonary edema. No pleural effusion or pneumothorax. Skeletal structures are demineralized but grossly intact. Stable changes from bilateral breast surgery. IMPRESSION: No acute cardiopulmonary disease.  Electronically Signed   By: Shanon Brow  Ormond M.D.   On: 03/01/2019 19:39   Dg Hip Unilat W Or Wo Pelvis 2-3 Views Right  Result Date: 03/01/2019 CLINICAL DATA:  Patient complains of fall today. Patient states her feet got twisted underneath her and she fell onto right side. Patient complains of mid right femur pain radiating up into her hip. Hx of right hip pinning. EXAM: DG HIP (WITH OR WITHOUT PELVIS) 2-3V RIGHT COMPARISON:  01/03/2019 FINDINGS: There is a subtle nondisplaced fracture of the right ilium. Fracture is non comminuted. No other evidence of an acute fracture. The right hip arthroplasty appears well seated and aligned and unchanged from the prior study. Skeletal structures are diffusely demineralized. Soft tissues are unremarkable other than surgical vascular clips along the medial right thigh and vascular calcifications. IMPRESSION: 1. Subtle nondisplaced, non comminuted fracture of the right ilium. 2. No other fractures. No evidence of disruption/loosening or malalignment of the right hip arthroplasty. Electronically Signed   By: Lajean Manes M.D.   On: 03/01/2019 17:38   Dg Femur Min 2 Views Right  Result Date: 03/01/2019 CLINICAL DATA:  Fall. EXAM: RIGHT FEMUR 2 VIEWS COMPARISON:  CT right hip and right hip and knee x-rays dated Jan 03, 2019. Right femur x-rays dated July 04, 2015. FINDINGS: New nondisplaced linear lucency through the right ilium. Subacute fracture of the right inferior pubic ramus with surrounding callus formation, new since May. 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 Osteopenia. Vascular calcifications. Multiple surgical clips again noted. IMPRESSION: 1. Acute nondisplaced fracture of the right ilium. 2. Subacute fracture of the right inferior pubic ramus, new since May. 3. Prior right total hip arthroplasty with  healing subacute to chronic periprosthetic fracture. Electronically Signed   By: Titus Dubin M.D.   On: 03/01/2019 17:47     ASSESSMENT AND PLAN:   73 year old female with a history of PAF, chronic kidney disease stage III and COPD who presented after mechanical fall.  1.  Acute fracture of the ileum which is nondisplaced and subacute fracture right inferior pubic rami: This is after mechanical fall.  Patient was eval by orthopedic surgery.  Patient will need ongoing supportive management.  This is nonoperative.  Patient will need to continue physical therapy.  Toe-touch weightbearing on the right lower extremity and use a walker for assistance with ambulation is recommended. She will follow-up with Dr. Posey Pronto in 1 to 2 weeks.  2.  Diabetes: Patient continue with outpatient regimen and ADA diet   3.  PAF: Continue aspirin, Coreg  4.  Essential hypertension: Continue clonidine, Coreg, isosorbide  5.  COPD without signs of exacerbation  6.  History of breast cancer: Continue Femara  7.  GERD: Continue PPI  8. Tobacco dependence: Patient is encouraged to quit smoking and willing to attempt to quit was assessed. Patient highly motivated.Counseling was provided for 4 minutes.      Management plans discussed with the patient and family and they are in agreement.  CODE STATUS: full  TOTAL TIME TAKING CARE OF THIS PATIENT: 30 minutes.     POSSIBLE D/C today SNF, DEPENDING ON insurance approval.   Bettey Costa M.D on 03/02/2019 at 11:53 AM  Between 7am to 6pm - Pager - [phone] After 6pm go to [email or web address] - password [facility]  Office  [phone]  CC: Primary care physician; Alessandra Grout, MD  Note:  This dictation was prepared with Dragon dictation along with smaller phrase technology. Any transcriptional errors that result from this process are unintentional.

## 2019-03-02 NOTE — Progress Notes (Signed)
Family Meeting Note  Advance Directive:no  Today a meeting took place with the Patient.    The following clinical team members were present during this meeting:MD  The following were discussed:Patient's diagnosis:pelvic fx diabetes , Patient's progosis: Unable to determine and Goals for treatment: Full Code  Additional follow-up to be provided: outpatient PC services and chaplain to create Ad  Time spent during discussion:16 monutes  Bettey Costa, MD

## 2019-03-06 LAB — GLUCOSE, CAPILLARY: Glucose-Capillary: 168 mg/dL — ABNORMAL HIGH (ref 70–99)

## 2019-03-25 DEATH — deceased
